# Patient Record
Sex: Male | Born: 1950 | Race: White | Hispanic: No | Marital: Married | Smoking: Former smoker
Health system: Southern US, Community
[De-identification: ages and names within clinical notes are randomized; demographics above are authoritative.]

## PROBLEM LIST (undated history)

## (undated) DIAGNOSIS — D649 Anemia, unspecified: Secondary | ICD-10-CM

## (undated) DIAGNOSIS — Z23 Encounter for immunization: Secondary | ICD-10-CM

## (undated) DIAGNOSIS — G4730 Sleep apnea, unspecified: Secondary | ICD-10-CM

## (undated) DIAGNOSIS — F329 Major depressive disorder, single episode, unspecified: Secondary | ICD-10-CM

## (undated) DIAGNOSIS — C8800 Waldenstrom macroglobulinemia not having achieved remission: Secondary | ICD-10-CM

## (undated) DIAGNOSIS — K635 Polyp of colon: Secondary | ICD-10-CM

## (undated) DIAGNOSIS — K219 Gastro-esophageal reflux disease without esophagitis: Secondary | ICD-10-CM

## (undated) DIAGNOSIS — K529 Noninfective gastroenteritis and colitis, unspecified: Secondary | ICD-10-CM

## (undated) DIAGNOSIS — F32A Depression, unspecified: Secondary | ICD-10-CM

## (undated) DIAGNOSIS — F419 Anxiety disorder, unspecified: Secondary | ICD-10-CM

## (undated) DIAGNOSIS — G629 Polyneuropathy, unspecified: Secondary | ICD-10-CM

## (undated) DIAGNOSIS — R1310 Dysphagia, unspecified: Secondary | ICD-10-CM

## (undated) DIAGNOSIS — C419 Malignant neoplasm of bone and articular cartilage, unspecified: Secondary | ICD-10-CM

## (undated) HISTORY — DX: Noninfective gastroenteritis and colitis, unspecified: K52.9

## (undated) HISTORY — DX: Waldenstrom macroglobulinemia: C88.0

## (undated) HISTORY — DX: Waldenstrom macroglobulinemia not having achieved remission: C88.00

## (undated) HISTORY — DX: Malignant neoplasm of bone and articular cartilage, unspecified: C41.9

## (undated) HISTORY — DX: Gastro-esophageal reflux disease without esophagitis: K21.9

## (undated) HISTORY — DX: Anxiety disorder, unspecified: F41.9

## (undated) HISTORY — PX: JOINT REPLACEMENT: SHX530

## (undated) HISTORY — DX: Encounter for immunization: Z23

## (undated) HISTORY — DX: Depression, unspecified: F32.A

## (undated) HISTORY — DX: Major depressive disorder, single episode, unspecified: F32.9

## (undated) HISTORY — PX: NASAL FRACTURE SURGERY: SHX718

## (undated) HISTORY — PX: CHOLECYSTECTOMY: SHX55

## (undated) HISTORY — PX: FRACTURE SURGERY: SHX138

---

## 2002-04-13 DIAGNOSIS — K529 Noninfective gastroenteritis and colitis, unspecified: Secondary | ICD-10-CM

## 2002-04-13 HISTORY — DX: Noninfective gastroenteritis and colitis, unspecified: K52.9

## 2002-07-02 ENCOUNTER — Emergency Department (HOSPITAL_COMMUNITY): Admission: EM | Admit: 2002-07-02 | Discharge: 2002-07-03 | Payer: Self-pay | Admitting: Emergency Medicine

## 2002-07-03 ENCOUNTER — Inpatient Hospital Stay (HOSPITAL_COMMUNITY): Admission: EM | Admit: 2002-07-03 | Discharge: 2002-07-08 | Payer: Self-pay | Admitting: Emergency Medicine

## 2002-07-03 ENCOUNTER — Encounter: Payer: Self-pay | Admitting: Surgery

## 2002-07-07 ENCOUNTER — Encounter (INDEPENDENT_AMBULATORY_CARE_PROVIDER_SITE_OTHER): Payer: Self-pay | Admitting: Specialist

## 2002-07-08 ENCOUNTER — Encounter: Payer: Self-pay | Admitting: Gastroenterology

## 2006-10-15 ENCOUNTER — Emergency Department: Payer: Self-pay | Admitting: Emergency Medicine

## 2009-03-12 ENCOUNTER — Encounter: Admission: RE | Admit: 2009-03-12 | Discharge: 2009-03-12 | Payer: Self-pay | Admitting: Endocrinology

## 2010-02-06 ENCOUNTER — Ambulatory Visit: Payer: Self-pay | Admitting: Oncology

## 2010-02-10 LAB — CBC WITH DIFFERENTIAL/PLATELET
BASO%: 0.2 % (ref 0.0–2.0)
EOS%: 0.5 % (ref 0.0–7.0)
LYMPH%: 22.3 % (ref 14.0–49.0)
MCHC: 33.1 g/dL (ref 32.0–36.0)
MCV: 90.2 fL (ref 79.3–98.0)
MONO%: 5.4 % (ref 0.0–14.0)
Platelets: 203 10*3/uL (ref 140–400)
RBC: 3.99 10*6/uL — ABNORMAL LOW (ref 4.20–5.82)
RDW: 13.9 % (ref 11.0–14.6)

## 2010-02-11 ENCOUNTER — Other Ambulatory Visit: Admission: RE | Admit: 2010-02-11 | Discharge: 2010-02-11 | Payer: Self-pay | Admitting: Oncology

## 2010-02-12 ENCOUNTER — Ambulatory Visit (HOSPITAL_COMMUNITY): Admission: RE | Admit: 2010-02-12 | Discharge: 2010-02-12 | Payer: Self-pay | Admitting: Oncology

## 2010-02-12 LAB — COMPREHENSIVE METABOLIC PANEL
ALT: <8 U/L (ref 0–53)
AST: 13 U/L (ref 0–37)
Alkaline Phosphatase: 57 U/L (ref 39–117)
Glucose, Bld: 99 mg/dL (ref 70–99)
Potassium: 4.3 mEq/L (ref 3.5–5.3)
Sodium: 140 mEq/L (ref 135–145)
Total Bilirubin: 0.8 mg/dL (ref 0.3–1.2)
Total Protein: 8.8 g/dL — ABNORMAL HIGH (ref 6.0–8.3)

## 2010-02-12 LAB — KAPPA/LAMBDA LIGHT CHAINS
Kappa:Lambda Ratio: 0.22 — ABNORMAL LOW (ref 0.26–1.65)
Lambda Free Lght Chn: 2.89 mg/dL — ABNORMAL HIGH (ref 0.57–2.63)

## 2010-02-12 LAB — SPEP & IFE WITH QIG
Alpha-1-Globulin: 3.5 % (ref 2.9–4.9)
Beta 2: 37.4 % — ABNORMAL HIGH (ref 3.2–6.5)
Gamma Globulin: 4.3 % — ABNORMAL LOW (ref 11.1–18.8)
IgG (Immunoglobin G), Serum: 639 mg/dL — ABNORMAL LOW (ref 694–1618)
IgM, Serum: 4400 mg/dL — ABNORMAL HIGH (ref 60–263)
M-Spike, %: 3.02 g/dL

## 2010-02-12 LAB — FERRITIN: Ferritin: 60 ng/mL (ref 22–322)

## 2010-02-12 LAB — URIC ACID: Uric Acid, Serum: 5.8 mg/dL (ref 4.0–7.8)

## 2010-02-12 LAB — FOLATE: Folate: 8.4 ng/mL

## 2010-02-12 LAB — IRON AND TIBC: %SAT: 33 % (ref 20–55)

## 2010-02-18 LAB — UIFE/LIGHT CHAINS/TP QN, 24-HR UR
Albumin, U: DETECTED
Alpha 2, Urine: DETECTED — AB
Beta, Urine: DETECTED — AB
Free Kappa/Lambda Ratio: 0.6 ratio (ref 0.46–4.00)
Total Protein, Urine-Ur/day: 84 mg/d (ref 10–140)
Total Protein, Urine: 8.8 mg/dL

## 2010-03-03 ENCOUNTER — Ambulatory Visit (HOSPITAL_COMMUNITY)
Admission: RE | Admit: 2010-03-03 | Discharge: 2010-03-03 | Payer: Self-pay | Source: Home / Self Care | Admitting: Oncology

## 2010-03-03 LAB — CBC WITH DIFFERENTIAL/PLATELET
BASO%: 0.6 % (ref 0.0–2.0)
EOS%: 2 % (ref 0.0–7.0)
HCT: 34.6 % — ABNORMAL LOW (ref 38.4–49.9)
MCH: 31.1 pg (ref 27.2–33.4)
MCHC: 34.3 g/dL (ref 32.0–36.0)
NEUT%: 51.5 % (ref 39.0–75.0)
lymph#: 1.8 10*3/uL (ref 0.9–3.3)

## 2010-04-02 ENCOUNTER — Ambulatory Visit: Payer: Self-pay | Admitting: Oncology

## 2010-04-03 LAB — CBC WITH DIFFERENTIAL/PLATELET
BASO%: 0.4 % (ref 0.0–2.0)
EOS%: 1.2 % (ref 0.0–7.0)
Eosinophils Absolute: 0.1 10*3/uL (ref 0.0–0.5)
MCV: 90.9 fL (ref 79.3–98.0)
MONO%: 7.4 % (ref 0.0–14.0)
NEUT#: 3.2 10*3/uL (ref 1.5–6.5)
RBC: 3.75 10*6/uL — ABNORMAL LOW (ref 4.20–5.82)
RDW: 14.4 % (ref 11.0–14.6)

## 2010-04-08 LAB — PROTEIN ELECTROPHORESIS, SERUM
Alpha-1-Globulin: 3.2 % (ref 2.9–4.9)
Alpha-2-Globulin: 7.9 % (ref 7.1–11.8)
Beta 2: 40.5 % — ABNORMAL HIGH (ref 3.2–6.5)
Beta Globulin: 4.5 % — ABNORMAL LOW (ref 4.7–7.2)
Gamma Globulin: 4.6 % — ABNORMAL LOW (ref 11.1–18.8)

## 2010-04-08 LAB — COMPREHENSIVE METABOLIC PANEL
ALT: 12 U/L (ref 0–53)
AST: 19 U/L (ref 0–37)
Albumin: 3.6 g/dL (ref 3.5–5.2)
Alkaline Phosphatase: 61 U/L (ref 39–117)
Glucose, Bld: 89 mg/dL (ref 70–99)
Potassium: 4.8 mEq/L (ref 3.5–5.3)
Sodium: 139 mEq/L (ref 135–145)
Total Protein: 9.3 g/dL — ABNORMAL HIGH (ref 6.0–8.3)

## 2010-04-28 LAB — CBC WITH DIFFERENTIAL/PLATELET
BASO%: 0.4 % (ref 0.0–2.0)
Basophils Absolute: 0 10*3/uL (ref 0.0–0.1)
EOS%: 1.3 % (ref 0.0–7.0)
Eosinophils Absolute: 0.1 10*3/uL (ref 0.0–0.5)
HCT: 36.4 % — ABNORMAL LOW (ref 38.4–49.9)
HGB: 12.5 g/dL — ABNORMAL LOW (ref 13.0–17.1)
LYMPH%: 29.7 % (ref 14.0–49.0)
MCH: 31.2 pg (ref 27.2–33.4)
MCHC: 34.3 g/dL (ref 32.0–36.0)
MCV: 90.8 fL (ref 79.3–98.0)
MONO#: 0.4 10*3/uL (ref 0.1–0.9)
MONO%: 7.8 % (ref 0.0–14.0)
NEUT#: 3.2 10*3/uL (ref 1.5–6.5)
NEUT%: 60.8 % (ref 39.0–75.0)
Platelets: 245 10*3/uL (ref 140–400)
RBC: 4.01 10*6/uL — ABNORMAL LOW (ref 4.20–5.82)
RDW: 14 % (ref 11.0–14.6)
WBC: 5.3 10*3/uL (ref 4.0–10.3)
lymph#: 1.6 10*3/uL (ref 0.9–3.3)

## 2010-04-30 LAB — PROTEIN ELECTROPHORESIS, SERUM
Albumin ELP: 39.2 % — ABNORMAL LOW (ref 55.8–66.1)
Alpha-1-Globulin: 3.6 % (ref 2.9–4.9)
Alpha-2-Globulin: 8.5 % (ref 7.1–11.8)
Beta 2: 40 % — ABNORMAL HIGH (ref 3.2–6.5)
Beta Globulin: 4.3 % — ABNORMAL LOW (ref 4.7–7.2)
Gamma Globulin: 4.4 % — ABNORMAL LOW (ref 11.1–18.8)
M-Spike, %: 3.59 g/dL
Total Protein, Serum Electrophoresis: 9.8 g/dL — ABNORMAL HIGH (ref 6.0–8.3)

## 2010-04-30 LAB — COMPREHENSIVE METABOLIC PANEL
ALT: 9 U/L (ref 0–53)
AST: 15 U/L (ref 0–37)
Albumin: 3.8 g/dL (ref 3.5–5.2)
Alkaline Phosphatase: 61 U/L (ref 39–117)
BUN: 29 mg/dL — ABNORMAL HIGH (ref 6–23)
CO2: 28 mEq/L (ref 19–32)
Calcium: 9.2 mg/dL (ref 8.4–10.5)
Chloride: 98 mEq/L (ref 96–112)
Creatinine, Ser: 0.95 mg/dL (ref 0.40–1.50)
Glucose, Bld: 95 mg/dL (ref 70–99)
Potassium: 4.9 mEq/L (ref 3.5–5.3)
Sodium: 136 mEq/L (ref 135–145)
Total Bilirubin: 0.6 mg/dL (ref 0.3–1.2)
Total Protein: 9.8 g/dL — ABNORMAL HIGH (ref 6.0–8.3)

## 2010-04-30 LAB — IGG, IGA, IGM
IgA: 85 mg/dL (ref 68–378)
IgG (Immunoglobin G), Serum: 666 mg/dL — ABNORMAL LOW (ref 694–1618)
IgM, Serum: 5180 mg/dL — ABNORMAL HIGH (ref 60–263)

## 2010-04-30 LAB — LACTATE DEHYDROGENASE: LDH: 81 U/L — ABNORMAL LOW (ref 94–250)

## 2010-04-30 LAB — URIC ACID: Uric Acid, Serum: 5.3 mg/dL (ref 4.0–7.8)

## 2010-05-02 ENCOUNTER — Ambulatory Visit: Payer: Self-pay | Admitting: Oncology

## 2010-05-14 ENCOUNTER — Other Ambulatory Visit: Payer: Self-pay | Admitting: Oncology

## 2010-05-14 DIAGNOSIS — C9000 Multiple myeloma not having achieved remission: Secondary | ICD-10-CM

## 2010-05-14 DIAGNOSIS — C880 Waldenstrom macroglobulinemia: Secondary | ICD-10-CM

## 2010-05-16 ENCOUNTER — Other Ambulatory Visit: Payer: Self-pay | Admitting: Oncology

## 2010-05-16 ENCOUNTER — Ambulatory Visit (HOSPITAL_COMMUNITY): Payer: 59

## 2010-05-16 ENCOUNTER — Other Ambulatory Visit: Payer: Self-pay | Admitting: Interventional Radiology

## 2010-05-16 ENCOUNTER — Ambulatory Visit (HOSPITAL_COMMUNITY)
Admission: RE | Admit: 2010-05-16 | Discharge: 2010-05-16 | Disposition: A | Discharge status: Home / Self Care | Payer: 59 | Source: Ambulatory Visit | Attending: Oncology | Admitting: Oncology

## 2010-05-16 ENCOUNTER — Encounter (HOSPITAL_COMMUNITY): Payer: Self-pay

## 2010-05-16 DIAGNOSIS — C880 Waldenstrom macroglobulinemia: Secondary | ICD-10-CM

## 2010-05-16 DIAGNOSIS — Z85830 Personal history of malignant neoplasm of bone: Secondary | ICD-10-CM | POA: Insufficient documentation

## 2010-05-16 DIAGNOSIS — D649 Anemia, unspecified: Secondary | ICD-10-CM | POA: Insufficient documentation

## 2010-05-16 DIAGNOSIS — C9000 Multiple myeloma not having achieved remission: Secondary | ICD-10-CM

## 2010-05-16 DIAGNOSIS — K219 Gastro-esophageal reflux disease without esophagitis: Secondary | ICD-10-CM | POA: Insufficient documentation

## 2010-05-16 HISTORY — DX: Anemia, unspecified: D64.9

## 2010-05-16 LAB — PROTIME-INR: INR: 1.19 (ref 0.00–1.49)

## 2010-05-16 LAB — CBC
HCT: 34.3 % — ABNORMAL LOW (ref 39.0–52.0)
Platelets: 212 10*3/uL (ref 150–400)
RBC: 3.8 MIL/uL — ABNORMAL LOW (ref 4.22–5.81)
RDW: 13.6 % (ref 11.5–15.5)
WBC: 5.1 10*3/uL (ref 4.0–10.5)

## 2010-05-16 LAB — APTT: aPTT: 33 seconds (ref 24–37)

## 2010-05-22 ENCOUNTER — Other Ambulatory Visit: Payer: Self-pay | Admitting: Oncology

## 2010-05-22 ENCOUNTER — Encounter (HOSPITAL_BASED_OUTPATIENT_CLINIC_OR_DEPARTMENT_OTHER): Payer: 59 | Admitting: Oncology

## 2010-05-22 DIAGNOSIS — R5381 Other malaise: Secondary | ICD-10-CM

## 2010-05-22 DIAGNOSIS — C9000 Multiple myeloma not having achieved remission: Secondary | ICD-10-CM

## 2010-05-22 DIAGNOSIS — D649 Anemia, unspecified: Secondary | ICD-10-CM

## 2010-05-22 DIAGNOSIS — R161 Splenomegaly, not elsewhere classified: Secondary | ICD-10-CM

## 2010-05-27 ENCOUNTER — Other Ambulatory Visit: Payer: Self-pay | Admitting: Oncology

## 2010-05-27 ENCOUNTER — Encounter (HOSPITAL_BASED_OUTPATIENT_CLINIC_OR_DEPARTMENT_OTHER): Payer: 59 | Admitting: Oncology

## 2010-05-27 DIAGNOSIS — C880 Waldenstrom macroglobulinemia: Secondary | ICD-10-CM

## 2010-05-27 DIAGNOSIS — R5381 Other malaise: Secondary | ICD-10-CM

## 2010-05-27 DIAGNOSIS — C9000 Multiple myeloma not having achieved remission: Secondary | ICD-10-CM

## 2010-05-27 DIAGNOSIS — R161 Splenomegaly, not elsewhere classified: Secondary | ICD-10-CM

## 2010-05-30 ENCOUNTER — Ambulatory Visit (HOSPITAL_COMMUNITY)
Admission: RE | Admit: 2010-05-30 | Discharge: 2010-05-30 | Disposition: A | Discharge status: Home / Self Care | Payer: 59 | Source: Ambulatory Visit | Attending: Oncology | Admitting: Oncology

## 2010-05-30 ENCOUNTER — Other Ambulatory Visit: Payer: Self-pay | Admitting: Oncology

## 2010-05-30 DIAGNOSIS — M9190 Juvenile osteochondrosis of hip and pelvis, unspecified, unspecified leg: Secondary | ICD-10-CM | POA: Insufficient documentation

## 2010-05-30 DIAGNOSIS — R161 Splenomegaly, not elsewhere classified: Secondary | ICD-10-CM

## 2010-06-02 ENCOUNTER — Other Ambulatory Visit: Payer: Self-pay | Admitting: Oncology

## 2010-06-02 ENCOUNTER — Encounter (HOSPITAL_BASED_OUTPATIENT_CLINIC_OR_DEPARTMENT_OTHER): Payer: 59 | Admitting: Oncology

## 2010-06-02 DIAGNOSIS — C880 Waldenstrom macroglobulinemia: Secondary | ICD-10-CM

## 2010-06-02 DIAGNOSIS — R5383 Other fatigue: Secondary | ICD-10-CM

## 2010-06-02 DIAGNOSIS — R161 Splenomegaly, not elsewhere classified: Secondary | ICD-10-CM

## 2010-06-02 DIAGNOSIS — D649 Anemia, unspecified: Secondary | ICD-10-CM

## 2010-06-02 DIAGNOSIS — Z5112 Encounter for antineoplastic immunotherapy: Secondary | ICD-10-CM

## 2010-06-02 LAB — CBC WITH DIFFERENTIAL/PLATELET
Basophils Absolute: 0 10*3/uL (ref 0.0–0.1)
Eosinophils Absolute: 0 10*3/uL (ref 0.0–0.5)
HGB: 11.7 g/dL — ABNORMAL LOW (ref 13.0–17.1)
LYMPH%: 31.7 % (ref 14.0–49.0)
MCV: 90.4 fL (ref 79.3–98.0)
MONO%: 6 % (ref 0.0–14.0)
NEUT#: 3.1 10*3/uL (ref 1.5–6.5)
NEUT%: 61.3 % (ref 39.0–75.0)
Platelets: 219 10*3/uL (ref 140–400)

## 2010-06-02 LAB — COMPREHENSIVE METABOLIC PANEL
CO2: 25 mEq/L (ref 19–32)
Creatinine, Ser: 0.86 mg/dL (ref 0.40–1.50)
Glucose, Bld: 91 mg/dL (ref 70–99)
Total Bilirubin: 0.6 mg/dL (ref 0.3–1.2)
Total Protein: 8.8 g/dL — ABNORMAL HIGH (ref 6.0–8.3)

## 2010-06-02 LAB — URIC ACID: Uric Acid, Serum: 5.7 mg/dL (ref 4.0–7.8)

## 2010-06-02 LAB — LACTATE DEHYDROGENASE: LDH: 114 U/L (ref 94–250)

## 2010-06-09 ENCOUNTER — Other Ambulatory Visit: Payer: Self-pay | Admitting: Oncology

## 2010-06-09 ENCOUNTER — Encounter (HOSPITAL_BASED_OUTPATIENT_CLINIC_OR_DEPARTMENT_OTHER): Payer: 59 | Admitting: Oncology

## 2010-06-09 DIAGNOSIS — D649 Anemia, unspecified: Secondary | ICD-10-CM

## 2010-06-09 DIAGNOSIS — Z5112 Encounter for antineoplastic immunotherapy: Secondary | ICD-10-CM

## 2010-06-09 DIAGNOSIS — R5381 Other malaise: Secondary | ICD-10-CM

## 2010-06-09 DIAGNOSIS — R161 Splenomegaly, not elsewhere classified: Secondary | ICD-10-CM

## 2010-06-09 DIAGNOSIS — C880 Waldenstrom macroglobulinemia: Secondary | ICD-10-CM

## 2010-06-09 LAB — COMPREHENSIVE METABOLIC PANEL
ALT: 10 U/L (ref 0–53)
Alkaline Phosphatase: 50 U/L (ref 39–117)
Sodium: 139 mEq/L (ref 135–145)
Total Bilirubin: 0.5 mg/dL (ref 0.3–1.2)
Total Protein: 9.3 g/dL — ABNORMAL HIGH (ref 6.0–8.3)

## 2010-06-09 LAB — CBC WITH DIFFERENTIAL/PLATELET
BASO%: 0.5 % (ref 0.0–2.0)
EOS%: 1.7 % (ref 0.0–7.0)
LYMPH%: 31.7 % (ref 14.0–49.0)
MCH: 30 pg (ref 27.2–33.4)
MCHC: 33.1 g/dL (ref 32.0–36.0)
MCV: 90.6 fL (ref 79.3–98.0)
MONO#: 0.2 10*3/uL (ref 0.1–0.9)
MONO%: 5.7 % (ref 0.0–14.0)
Platelets: 264 10*3/uL (ref 140–400)
RBC: 4.04 10*6/uL — ABNORMAL LOW (ref 4.20–5.82)
WBC: 4.2 10*3/uL (ref 4.0–10.3)

## 2010-06-16 ENCOUNTER — Encounter (HOSPITAL_BASED_OUTPATIENT_CLINIC_OR_DEPARTMENT_OTHER): Payer: 59 | Admitting: Oncology

## 2010-06-16 ENCOUNTER — Other Ambulatory Visit: Payer: Self-pay | Admitting: Oncology

## 2010-06-16 DIAGNOSIS — R161 Splenomegaly, not elsewhere classified: Secondary | ICD-10-CM

## 2010-06-16 DIAGNOSIS — C880 Waldenstrom macroglobulinemia: Secondary | ICD-10-CM

## 2010-06-16 DIAGNOSIS — Z5112 Encounter for antineoplastic immunotherapy: Secondary | ICD-10-CM

## 2010-06-16 DIAGNOSIS — R630 Anorexia: Secondary | ICD-10-CM

## 2010-06-16 DIAGNOSIS — R5381 Other malaise: Secondary | ICD-10-CM

## 2010-06-16 DIAGNOSIS — D649 Anemia, unspecified: Secondary | ICD-10-CM

## 2010-06-16 LAB — CBC WITH DIFFERENTIAL/PLATELET
BASO%: 0.1 % (ref 0.0–2.0)
Eosinophils Absolute: 0 10*3/uL (ref 0.0–0.5)
HCT: 33.5 % — ABNORMAL LOW (ref 38.4–49.9)
LYMPH%: 8 % — ABNORMAL LOW (ref 14.0–49.0)
MCHC: 34.3 g/dL (ref 32.0–36.0)
MCV: 90.3 fL (ref 79.3–98.0)
MONO%: 4.2 % (ref 0.0–14.0)
NEUT%: 87.6 % — ABNORMAL HIGH (ref 39.0–75.0)
Platelets: 300 10*3/uL (ref 140–400)
RBC: 3.72 10*6/uL — ABNORMAL LOW (ref 4.20–5.82)

## 2010-06-23 ENCOUNTER — Other Ambulatory Visit: Payer: Self-pay | Admitting: Oncology

## 2010-06-23 ENCOUNTER — Encounter (HOSPITAL_BASED_OUTPATIENT_CLINIC_OR_DEPARTMENT_OTHER): Payer: 59 | Admitting: Oncology

## 2010-06-23 DIAGNOSIS — R161 Splenomegaly, not elsewhere classified: Secondary | ICD-10-CM

## 2010-06-23 DIAGNOSIS — D649 Anemia, unspecified: Secondary | ICD-10-CM

## 2010-06-23 DIAGNOSIS — M25559 Pain in unspecified hip: Secondary | ICD-10-CM

## 2010-06-23 DIAGNOSIS — R5381 Other malaise: Secondary | ICD-10-CM

## 2010-06-23 DIAGNOSIS — C8800 Waldenstrom macroglobulinemia not having achieved remission: Secondary | ICD-10-CM

## 2010-06-23 DIAGNOSIS — Z5112 Encounter for antineoplastic immunotherapy: Secondary | ICD-10-CM

## 2010-06-23 DIAGNOSIS — D6481 Anemia due to antineoplastic chemotherapy: Secondary | ICD-10-CM

## 2010-06-23 DIAGNOSIS — T451X5A Adverse effect of antineoplastic and immunosuppressive drugs, initial encounter: Secondary | ICD-10-CM

## 2010-06-23 LAB — CBC WITH DIFFERENTIAL/PLATELET
BASO%: 0.2 % (ref 0.0–2.0)
EOS%: 1 % (ref 0.0–7.0)
LYMPH%: 28 % (ref 14.0–49.0)
MCH: 29.9 pg (ref 27.2–33.4)
MCHC: 32.9 g/dL (ref 32.0–36.0)
MONO#: 0.4 10*3/uL (ref 0.1–0.9)
Platelets: 304 10*3/uL (ref 140–400)
RBC: 3.74 10*6/uL — ABNORMAL LOW (ref 4.20–5.82)
WBC: 4.9 10*3/uL (ref 4.0–10.3)

## 2010-06-23 LAB — COMPREHENSIVE METABOLIC PANEL
ALT: 16 U/L (ref 0–53)
AST: 14 U/L (ref 0–37)
Alkaline Phosphatase: 62 U/L (ref 39–117)
CO2: 25 mEq/L (ref 19–32)
Creatinine, Ser: 0.76 mg/dL (ref 0.40–1.50)
Sodium: 136 mEq/L (ref 135–145)
Total Bilirubin: 0.4 mg/dL (ref 0.3–1.2)
Total Protein: 8.2 g/dL (ref 6.0–8.3)

## 2010-06-24 ENCOUNTER — Other Ambulatory Visit: Payer: Self-pay | Admitting: Oncology

## 2010-06-24 DIAGNOSIS — C8800 Waldenstrom macroglobulinemia not having achieved remission: Secondary | ICD-10-CM

## 2010-06-24 DIAGNOSIS — R161 Splenomegaly, not elsewhere classified: Secondary | ICD-10-CM

## 2010-06-24 LAB — CHROMOSOME ANALYSIS, BONE MARROW

## 2010-06-24 LAB — BONE MARROW EXAM

## 2010-06-24 LAB — TISSUE HYBRIDIZATION (BONE MARROW)-NCBH

## 2010-07-04 ENCOUNTER — Other Ambulatory Visit (HOSPITAL_COMMUNITY): Payer: 59

## 2010-08-12 ENCOUNTER — Other Ambulatory Visit: Payer: Self-pay | Admitting: Oncology

## 2010-08-12 ENCOUNTER — Encounter (HOSPITAL_BASED_OUTPATIENT_CLINIC_OR_DEPARTMENT_OTHER): Payer: 59 | Admitting: Oncology

## 2010-08-12 DIAGNOSIS — D649 Anemia, unspecified: Secondary | ICD-10-CM

## 2010-08-12 LAB — CBC WITH DIFFERENTIAL/PLATELET
EOS%: 0.8 % (ref 0.0–7.0)
MCH: 30.6 pg (ref 27.2–33.4)
MCV: 90 fL (ref 79.3–98.0)
MONO%: 6.5 % (ref 0.0–14.0)
NEUT#: 3.5 10*3/uL (ref 1.5–6.5)
RBC: 3.74 10*6/uL — ABNORMAL LOW (ref 4.20–5.82)
RDW: 13.4 % (ref 11.0–14.6)
lymph#: 1 10*3/uL (ref 0.9–3.3)

## 2010-08-12 LAB — CHCC SMEAR

## 2010-08-13 ENCOUNTER — Other Ambulatory Visit: Payer: Self-pay | Admitting: Interventional Radiology

## 2010-08-13 ENCOUNTER — Ambulatory Visit (HOSPITAL_COMMUNITY): Payer: 59

## 2010-08-13 ENCOUNTER — Ambulatory Visit (HOSPITAL_COMMUNITY)
Admission: RE | Admit: 2010-08-13 | Discharge: 2010-08-13 | Disposition: A | Discharge status: Home / Self Care | Payer: 59 | Source: Ambulatory Visit | Attending: Oncology | Admitting: Oncology

## 2010-08-13 ENCOUNTER — Other Ambulatory Visit: Payer: Self-pay | Admitting: Oncology

## 2010-08-13 DIAGNOSIS — C8800 Waldenstrom macroglobulinemia not having achieved remission: Secondary | ICD-10-CM | POA: Insufficient documentation

## 2010-08-13 DIAGNOSIS — Z79899 Other long term (current) drug therapy: Secondary | ICD-10-CM | POA: Insufficient documentation

## 2010-08-13 DIAGNOSIS — D649 Anemia, unspecified: Secondary | ICD-10-CM | POA: Insufficient documentation

## 2010-08-13 DIAGNOSIS — R161 Splenomegaly, not elsewhere classified: Secondary | ICD-10-CM | POA: Insufficient documentation

## 2010-08-13 LAB — PROTIME-INR
INR: 1.22 (ref 0.00–1.49)
Prothrombin Time: 15.6 seconds — ABNORMAL HIGH (ref 11.6–15.2)

## 2010-08-13 LAB — CBC
Platelets: 247 10*3/uL (ref 150–400)
RDW: 13 % (ref 11.5–15.5)
WBC: 4.7 10*3/uL (ref 4.0–10.5)

## 2010-08-13 LAB — APTT: aPTT: 34 seconds (ref 24–37)

## 2010-08-14 LAB — COMPREHENSIVE METABOLIC PANEL
ALT: 8 U/L (ref 0–53)
AST: 17 U/L (ref 0–37)
Alkaline Phosphatase: 74 U/L (ref 39–117)
Creatinine, Ser: 0.86 mg/dL (ref 0.40–1.50)
Total Bilirubin: 0.6 mg/dL (ref 0.3–1.2)

## 2010-08-14 LAB — IGG, IGA, IGM
IgA: 79 mg/dL (ref 68–378)
IgM, Serum: 3530 mg/dL — ABNORMAL HIGH (ref 60–263)

## 2010-08-14 LAB — PROTEIN ELECTROPHORESIS, SERUM
Alpha-2-Globulin: 10.4 % (ref 7.1–11.8)
Beta 2: 33.4 % — ABNORMAL HIGH (ref 3.2–6.5)
Beta Globulin: 4.9 % (ref 4.7–7.2)
Gamma Globulin: 4.1 % — ABNORMAL LOW (ref 11.1–18.8)
M-Spike, %: 2.46 g/dL
Total Protein, Serum Electrophoresis: 8.4 g/dL — ABNORMAL HIGH (ref 6.0–8.3)

## 2010-08-14 LAB — KAPPA/LAMBDA LIGHT CHAINS: Lambda Free Lght Chn: 2.1 mg/dL (ref 0.57–2.63)

## 2010-08-21 ENCOUNTER — Encounter (HOSPITAL_BASED_OUTPATIENT_CLINIC_OR_DEPARTMENT_OTHER): Payer: 59 | Admitting: Oncology

## 2010-08-21 DIAGNOSIS — R630 Anorexia: Secondary | ICD-10-CM

## 2010-08-21 DIAGNOSIS — C880 Waldenstrom macroglobulinemia: Secondary | ICD-10-CM

## 2010-08-21 DIAGNOSIS — D649 Anemia, unspecified: Secondary | ICD-10-CM

## 2010-08-29 NOTE — Consult Note (Signed)
NAME:  [name], [name] NO.:  [id number]   MEDICAL RECORD NO.:  [id number]                   PATIENT TYPE:  EMS   LOCATION:  ED                                   FACILITY:  [facility]   PHYSICIAN:  Velora Heckler, M.D.                DATE OF BIRTH:  October 15, 1950   DATE OF CONSULTATION:  07/02/2002  DATE OF DISCHARGE:  07/03/2002                                   CONSULTATION   REASON FOR CONSULTATION:  Abdominal pain.   REFERRING PHYSICIAN:  Lorre Nick, M.D. in the emergency department at  [facility].   HISTORY OF PRESENT ILLNESS:  The patient is a 60 year old white male from  [place], [place], who presents with a 24 hour history of right lower quadrant  abdominal pain.  The patient experienced onset of pain on the evening of  07/01/02.  It persisted through the night and became more severe during the  day of 07/02/02.  The pain was always in the right lower quadrant and  suprapubic region.  It did not radiate.  It did not seem to change in  character.  The patient denies fevers or chills.  He denies nausea or  vomiting.  He had noted diarrheal stools for approximately one week.  The  patient did have cold sweats earlier in the day.  No other family members  are ill.  The patient notes pain with movement.  He presented to the  emergency department for evaluation.   PAST MEDICAL HISTORY:  History of nasal surgery.   MEDICATIONS:  None.   ALLERGIES:  No known drug allergies.   SOCIAL HISTORY:  The patient is accompanied by his wife.  He works as a  Retail buyer for [employer].  He smokes a pack of cigarettes a  day.  He denies alcohol use, he denies drug use.   REVIEW OF SYMPTOMS:  A 15 system review without significant other positives  except as noted above.   FAMILY HISTORY:  No untoward events with either surgery or anesthesia in any  family member.   PHYSICAL EXAMINATION:  GENERAL:  A 60 year old thin white male on  a  stretcher in the emergency department.  VITAL SIGNS:  Temperature 97, pulse 90, respirations 24, blood pressure  131/83, O2 saturation 100% on room air.  HEENT:  Normocephalic.  Sclerae are clear.  Dentition are fair.  Voice is  normal.  NECK:  Anterior examination of the neck shows it to be symmetric, thyroid is  normal without nodularity.  There is no anterior or posterior cervical  adenopathy.  LUNGS:  Clear to auscultation.  CARDIAC:  Regular rate and rhythm without murmur.  Peripheral pulses are  full.  ABDOMEN:  Scaphoid.  There is voluntary guarding.  There are a few scattered  bowel sounds on auscultation.  There are no obvious surgical wounds.  There  is no  sign of hernia.  There is tenderness to percussion and palpation in  the suprapubic and right lower quadrant regions.  There is mild rebound  tenderness.  EXTREMITIES:  Nontender without edema.  NEUROLOGIC:  The patient is alert and oriented without focal neurologic  deficits.   LABORATORY DATA:  Laboratory studies drawn in the emergency department  include a white blood cell count of 5.8, hemoglobin 13.3, hematocrit 38.5%,  platelet count 246,000.  Differential shows 55% neutrophils, 30%  lymphocytes, 12% monocytes, and 3% eosinophils.  Urinalysis is benign.  Chemistry profile is completely within normal limits, including  electrolytes, renal function, and liver function tests.   IMPRESSION:  Right lower quadrant and suprapubic abdominal pain of  undetermined etiology, one week history of diarrheal stools.   PLAN:  CT scan of abdomen and pelvis will be obtained in the emergency  department to rule out acute intra-abdominal process such as appendicitis,  intussusception, or small bowel obstruction.  If the scan is unrevealing,  may consider a period of observation in the hospital.  We will await results  of CT scan of abdomen and pelvis to be performed urgently this evening.                                                 Velora Heckler, M.D.    TMG/MEDQ  D:  07/03/2002  T:  07/03/2002  Job:  [id number]   cc:   Lorre Nick, M.D.  [address] [place], [place] [address]  Fax: [phone]

## 2010-08-29 NOTE — Discharge Summary (Signed)
NAME:  [name] NO.:  [id number]   MEDICAL RECORD NO.:  [id number]                   PATIENT TYPE:  INP   LOCATION:  0478                                 FACILITY:  [facility]   PHYSICIAN:  Sharlet Salina T. Hoxworth, M.D.          DATE OF BIRTH:  November 15, 1950   DATE OF ADMISSION:  07/03/2002  DATE OF DISCHARGE:  07/08/2002                                 DISCHARGE SUMMARY   DISCHARGE DIAGNOSIS:  Right lower quadrant abdominal pain, apparent  infectious ileitis.   OPERATIONS AND PROCEDURES:  Colonoscopy, Malcolm T. Russella Dar, M.D., 07/07/02.   HISTORY OF PRESENT ILLNESS:  Mr. [name] is a 60 year old white male, who,  one week prior to this admission, had the onset of diarrhea with 1-2 watery,  brown or greenish stools per day.  Two days ago, he had the gradual onset of  occasional brief right lower quadrant abdominal pain.  Over the past 24  hours, he has had more severe constant pain in the right lower quadrant  which still feels to be cramping in nature with exacerbations but fairly  constant.  He has had no nausea or vomiting.  He continues to have diarrhea  as above.  No fever or chills.  He has no history of any previous similar  symptoms or pain.  The patient was evaluated in the emergency room yesterday  and discharged home with a diagnosis of gastroenteritis but re-presented  today with persistent and increasing right lower quadrant pain.   PAST MEDICAL HISTORY:  1. Nasal surgery.  2. No other serious illness or hospitalizations.   MEDICATIONS:  He is on no medications.   ALLERGIES:  None.   HABITS:  He does smoke cigarettes.   PHYSICAL EXAMINATION:  VITAL SIGNS:  Afebrile, pulse 90, respirations 22,  blood pressure 135/84.  GENERAL:  He is a thin, white male, who appears in significant pain,  occasionally writhing on the bed.  ABDOMEN:  Pertinent findings within the abdomen which revealed hyperactive  bowel sounds.  There was tenderness and  guarding in the right lower  quadrant.   LABORATORY DATA:  White count 7.8.  LFTs, electrolytes, urinalysis all  negative.   CT scan from the previous day was re-reviewed which revealed some moderate  amount of pelvic free fluid.  The appendix was seen and appeared normal, and  there appeared to be questionable thickening in the distal ileum.   HOSPITAL COURSE:  The patient was admitted due to persistent and worsening  pain with a working diagnosis of colitis or ileitis.  Based on his clinical  presentation and CT scan, appendicitis was felt to be much less likely.  He  was treated with intravenous Cipro and Flagyl.  He remained afebrile with a  normal white count.  His abdominal tenderness and pain, however, persisted  over the next 48 hours.  Stool studies, routine cultures, O&P, and stool for  blood and white count  were all negative.  I did consult gastroenterology at  this point, and he was seen by Judie Petit T. Russella Dar, M.D.  This was felt to be  probably most likely an infectious colitis or ileitis but with his  persistent symptoms, more serious problems such as Crohn's disease was felt  possible as well.  By 07/06/02, he was feeling somewhat better but still  having pain.  Colonoscopy was recommended for 07/07/02, which was performed  and was essentially normal with specifically no significant inflammatory  change seen.  Biopsies of the ascending colon showed no active inflammation  or granulomas.  A small tubular adenoma was removed from the sigmoid and  proximal rectum.  One was hyperplastic; one was tubular.  In order to more  completely evaluate the distal small bowel, a small-bowel follow-through was  obtained on 07/08/02 which was normal.  At this point, the patient was  feeling significantly better; his diarrhea had improved, and he was  tolerating a regular diet.  He was discharged home at this time.   DISCHARGE MEDICATIONS:  He will continue p.o. Cipro and Flagyl for one  week.   FOLLOW UP:  He is to see Judie Petit T. Russella Dar, M.D. in follow-up in one week.                                               Lorne Skeens. Hoxworth, M.D.    Tory Emerald  D:  07/17/2002  T:  07/17/2002  Job:  [id number]   cc:   Venita Lick. Russella Dar, M.D. [facility]

## 2010-08-29 NOTE — H&P (Signed)
NAME:  [name], [name] NO.:  [id number]   MEDICAL RECORD NO.:  [id number]                   PATIENT TYPE:  INP   LOCATION:  0478                                 FACILITY:  [facility]   PHYSICIAN:  Sharlet Salina T. Hoxworth, M.D.          DATE OF BIRTH:  1951/03/14   DATE OF ADMISSION:  07/03/2002  DATE OF DISCHARGE:                                HISTORY & PHYSICAL   CHIEF COMPLAINT:  Right-sided abdominal pain.   HISTORY OF PRESENT ILLNESS:  The patient is a 60 year old white male  previously in excellent health who approximately one week ago had the onset  of diarrhea.  Since that time he has noted one or two watery stools per day.  These have not had any blood or mucous that he has noted.  He continued to  work and otherwise felt reasonably well until two days ago when he began  having intermittent mild right lower quadrant abdominal pain.  Yesterday the  pain became much more severe and constant.  He describes cramping pain in  his right lower quadrant that is now very severe.  This has persisted  through today, unchanged.  He was seen in the [facility] Emergency Room  yesterday and had a fairly extensive evaluation that was unrevealing.  His  symptoms have persisted, and he re-presented today due to continued severe  pain.  He denies any previous history of GI disease or any similar symptoms.  No fever or chills.  No nausea or vomiting.  No urinary symptoms.   PAST SURGICAL HISTORY:  He has had nasal surgery.   PAST MEDICAL HISTORY:  No serious illness or hospitalizations.   MEDICATIONS:  He does not take any medications regularly.   ALLERGIES:  No known drug allergies.   SOCIAL HISTORY:  He is married.  Works as a Retail buyer at [employer].  He does not drink alcohol.  He does smoke cigarettes.   FAMILY HISTORY:  Noncontributory.   REVIEW OF SYSTEMS:  GENERAL:  No fever, chills, weight loss.  RESPIRATORY:  No cough, shortness of breath,  wheezing.  CARDIAC:  No chest pain,  palpitations, history of heart disease.  GASTROINTESTINAL:  As above.   PHYSICAL EXAMINATION:  VITAL SIGNS:  Temperature 96.9, pulse 90,  respirations 22, blood pressure 135/84.  GENERAL:  Thin white male in obvious pain, restless and writhing on the  stretcher.  SKIN:  Warm and dry.  Without rash or infection.  HEENT:  No palpable thyromegaly.  Sclerae nonicteric.  Nares and oropharynx  clear.  LYMPH NODES:  No cervical, supraclavicular, axillary, or inguinal nodes  palpable.  CARDIAC:  Slight tachycardia.  Regular rhythm.  No murmurs.  Peripheral  pulses intact.  No edema.  ABDOMEN:  Nondistended.  Bowel sounds are hyperactive.  There is moderate to  marked tenderness in the right lower quadrant and also along the right mid  abdomen and right upper quadrant  to a lesser extent with some guarding.  No  apparent rebound tenderness.  No appreciable masses or hepatosplenomegaly.  EXTREMITIES:  No deformity or joint swelling.  NEUROLOGIC:  Alert, fully oriented.  Motor and sensory exams grossly normal.   LABORATORY DATA:  Yesterday's evaluation included a CBC, chemistries, LFTs,  urinalysis, all of which were within normal limits.  Repeat CBC today shows  a white count of 7.8, normal hemoglobin.  Repeat urinalysis pending.   CT scan of the abdomen and pelvis was obtained yesterday and reviewed again  with the radiologist today.  This shows a mild to moderate amount of free  pelvic fluid.  The appendix is well visualized and appears entirely normal.  There may be some mild thickening of a few loops of distal small bowel, but  this is a questionable finding.  No other pathology seen.   ASSESSMENT AND PLAN:  Persistent severe right-sided abdominal pain with  essentially negative workup except for some free fluid seen in the pelvis on  CT scan.  The crampy nature of his pain and diarrhea suggest enteritis or  colitis with spasm.  Due to the patient's  persistent symptoms and severe  pain, he will be admitted for symptom control and further evaluation and  treatment.  Will cover with Cipro and Flagyl and obtain stool for cultures,  O&P.                                               Lorne Skeens. Hoxworth, M.D.    Tory Emerald  D:  07/03/2002  T:  07/04/2002  Job:  [id number]

## 2010-09-05 ENCOUNTER — Other Ambulatory Visit: Payer: Self-pay | Admitting: Oncology

## 2010-09-05 ENCOUNTER — Encounter (HOSPITAL_BASED_OUTPATIENT_CLINIC_OR_DEPARTMENT_OTHER): Payer: 59 | Admitting: Oncology

## 2010-09-05 DIAGNOSIS — C880 Waldenstrom macroglobulinemia: Secondary | ICD-10-CM

## 2010-09-05 DIAGNOSIS — R630 Anorexia: Secondary | ICD-10-CM

## 2010-09-05 DIAGNOSIS — Z5112 Encounter for antineoplastic immunotherapy: Secondary | ICD-10-CM

## 2010-09-05 DIAGNOSIS — D649 Anemia, unspecified: Secondary | ICD-10-CM

## 2010-09-05 LAB — COMPREHENSIVE METABOLIC PANEL
ALT: 8 U/L (ref 0–53)
AST: 15 U/L (ref 0–37)
Albumin: 4 g/dL (ref 3.5–5.2)
Calcium: 9.5 mg/dL (ref 8.4–10.5)
Chloride: 103 mEq/L (ref 96–112)
Potassium: 4.4 mEq/L (ref 3.5–5.3)
Total Protein: 8 g/dL (ref 6.0–8.3)

## 2010-09-05 LAB — CBC WITH DIFFERENTIAL/PLATELET
BASO%: 0.7 % (ref 0.0–2.0)
Basophils Absolute: 0 10*3/uL (ref 0.0–0.1)
EOS%: 3.7 % (ref 0.0–7.0)
HGB: 11.4 g/dL — ABNORMAL LOW (ref 13.0–17.1)
MCH: 30 pg (ref 27.2–33.4)
RDW: 13.6 % (ref 11.0–14.6)
lymph#: 1.4 10*3/uL (ref 0.9–3.3)

## 2010-09-12 DIAGNOSIS — G4730 Sleep apnea, unspecified: Secondary | ICD-10-CM

## 2010-09-12 HISTORY — DX: Sleep apnea, unspecified: G47.30

## 2010-10-23 ENCOUNTER — Other Ambulatory Visit: Payer: Self-pay | Admitting: Oncology

## 2010-10-23 ENCOUNTER — Encounter (HOSPITAL_BASED_OUTPATIENT_CLINIC_OR_DEPARTMENT_OTHER): Payer: 59 | Admitting: Oncology

## 2010-10-23 DIAGNOSIS — C880 Waldenstrom macroglobulinemia: Secondary | ICD-10-CM

## 2010-10-23 DIAGNOSIS — Z5112 Encounter for antineoplastic immunotherapy: Secondary | ICD-10-CM

## 2010-10-23 DIAGNOSIS — D649 Anemia, unspecified: Secondary | ICD-10-CM

## 2010-10-23 DIAGNOSIS — R630 Anorexia: Secondary | ICD-10-CM

## 2010-10-23 LAB — CBC WITH DIFFERENTIAL/PLATELET
BASO%: 0.3 % (ref 0.0–2.0)
EOS%: 1.8 % (ref 0.0–7.0)
MCH: 30.9 pg (ref 27.2–33.4)
MCHC: 34.5 g/dL (ref 32.0–36.0)
RDW: 14.7 % — ABNORMAL HIGH (ref 11.0–14.6)
lymph#: 1.1 10*3/uL (ref 0.9–3.3)

## 2010-10-27 LAB — IGG, IGA, IGM
IgA: 87 mg/dL (ref 68–379)
IgG (Immunoglobin G), Serum: 593 mg/dL — ABNORMAL LOW (ref 650–1600)

## 2010-10-27 LAB — COMPREHENSIVE METABOLIC PANEL
ALT: 11 U/L (ref 0–53)
AST: 15 U/L (ref 0–37)
Albumin: 3.8 g/dL (ref 3.5–5.2)
Alkaline Phosphatase: 67 U/L (ref 39–117)
Calcium: 9.8 mg/dL (ref 8.4–10.5)
Chloride: 103 mEq/L (ref 96–112)
Creatinine, Ser: 0.94 mg/dL (ref 0.50–1.35)
Potassium: 4.6 mEq/L (ref 3.5–5.3)

## 2010-10-27 LAB — PROTEIN ELECTROPHORESIS, SERUM
Alpha-2-Globulin: 9.2 % (ref 7.1–11.8)
Beta 2: 30.6 % — ABNORMAL HIGH (ref 3.2–6.5)
Gamma Globulin: 4.2 % — ABNORMAL LOW (ref 11.1–18.8)
M-Spike, %: 2.25 g/dL

## 2010-10-31 ENCOUNTER — Encounter (HOSPITAL_BASED_OUTPATIENT_CLINIC_OR_DEPARTMENT_OTHER): Payer: 59 | Admitting: Oncology

## 2010-10-31 DIAGNOSIS — C880 Waldenstrom macroglobulinemia: Secondary | ICD-10-CM

## 2010-11-05 ENCOUNTER — Encounter (HOSPITAL_BASED_OUTPATIENT_CLINIC_OR_DEPARTMENT_OTHER): Payer: 59 | Admitting: Oncology

## 2010-11-05 ENCOUNTER — Other Ambulatory Visit: Payer: Self-pay | Admitting: Oncology

## 2010-11-05 DIAGNOSIS — Z5112 Encounter for antineoplastic immunotherapy: Secondary | ICD-10-CM

## 2010-11-05 DIAGNOSIS — R630 Anorexia: Secondary | ICD-10-CM

## 2010-11-05 DIAGNOSIS — C880 Waldenstrom macroglobulinemia: Secondary | ICD-10-CM

## 2010-11-05 DIAGNOSIS — D649 Anemia, unspecified: Secondary | ICD-10-CM

## 2010-11-05 LAB — CBC WITH DIFFERENTIAL/PLATELET
Basophils Absolute: 0 10*3/uL (ref 0.0–0.1)
EOS%: 0.7 % (ref 0.0–7.0)
HGB: 13 g/dL (ref 13.0–17.1)
LYMPH%: 13.8 % — ABNORMAL LOW (ref 14.0–49.0)
MCH: 29.5 pg (ref 27.2–33.4)
MCV: 88 fL (ref 79.3–98.0)
MONO%: 8.6 % (ref 0.0–14.0)
Platelets: 221 10*3/uL (ref 140–400)
RBC: 4.4 10*6/uL (ref 4.20–5.82)
RDW: 13.9 % (ref 11.0–14.6)

## 2011-01-02 ENCOUNTER — Ambulatory Visit (HOSPITAL_BASED_OUTPATIENT_CLINIC_OR_DEPARTMENT_OTHER): Payer: 59 | Admitting: Oncology

## 2011-01-02 ENCOUNTER — Other Ambulatory Visit: Payer: Self-pay | Admitting: Oncology

## 2011-01-02 VITALS — BP 99/60 | HR 72 | Temp 97.2°F | Ht 71.0 in | Wt 130.2 lb

## 2011-01-02 DIAGNOSIS — R5381 Other malaise: Secondary | ICD-10-CM

## 2011-01-02 DIAGNOSIS — Z5112 Encounter for antineoplastic immunotherapy: Secondary | ICD-10-CM

## 2011-01-02 DIAGNOSIS — R630 Anorexia: Secondary | ICD-10-CM

## 2011-01-02 DIAGNOSIS — C880 Waldenstrom macroglobulinemia: Secondary | ICD-10-CM

## 2011-01-02 DIAGNOSIS — D649 Anemia, unspecified: Secondary | ICD-10-CM

## 2011-01-02 LAB — CBC WITH DIFFERENTIAL/PLATELET
BASO%: 0.4 % (ref 0.0–2.0)
EOS%: 2.9 % (ref 0.0–7.0)
Eosinophils Absolute: 0.1 10*3/uL (ref 0.0–0.5)
LYMPH%: 26.8 % (ref 14.0–49.0)
MCHC: 33.5 g/dL (ref 32.0–36.0)
MCV: 89.5 fL (ref 79.3–98.0)
MONO%: 10.1 % (ref 0.0–14.0)
NEUT#: 2.7 10*3/uL (ref 1.5–6.5)
Platelets: 242 10*3/uL (ref 140–400)
RBC: 4 10*6/uL — ABNORMAL LOW (ref 4.20–5.82)
RDW: 13.7 % (ref 11.0–14.6)

## 2011-01-03 LAB — COMPREHENSIVE METABOLIC PANEL
ALT: 10 U/L (ref 0–53)
AST: 15 U/L (ref 0–37)
Albumin: 4.2 g/dL (ref 3.5–5.2)
Alkaline Phosphatase: 53 U/L (ref 39–117)
Potassium: 4.1 mEq/L (ref 3.5–5.3)
Sodium: 138 mEq/L (ref 135–145)
Total Bilirubin: 0.8 mg/dL (ref 0.3–1.2)
Total Protein: 7.9 g/dL (ref 6.0–8.3)

## 2011-01-03 LAB — IGM: IgM, Serum: 2600 mg/dL — ABNORMAL HIGH (ref 41–251)

## 2011-01-29 ENCOUNTER — Encounter: Payer: Self-pay | Admitting: Oncology

## 2011-02-13 ENCOUNTER — Other Ambulatory Visit: Payer: Self-pay | Admitting: Oncology

## 2011-02-13 ENCOUNTER — Encounter: Payer: Self-pay | Admitting: Oncology

## 2011-02-13 DIAGNOSIS — K219 Gastro-esophageal reflux disease without esophagitis: Secondary | ICD-10-CM | POA: Insufficient documentation

## 2011-02-13 DIAGNOSIS — C880 Waldenstrom macroglobulinemia: Secondary | ICD-10-CM | POA: Insufficient documentation

## 2011-02-24 ENCOUNTER — Other Ambulatory Visit: Payer: Self-pay | Admitting: Oncology

## 2011-02-27 ENCOUNTER — Other Ambulatory Visit: Payer: Self-pay | Admitting: Oncology

## 2011-02-27 ENCOUNTER — Ambulatory Visit (HOSPITAL_BASED_OUTPATIENT_CLINIC_OR_DEPARTMENT_OTHER): Payer: 59 | Admitting: Oncology

## 2011-02-27 ENCOUNTER — Other Ambulatory Visit (HOSPITAL_BASED_OUTPATIENT_CLINIC_OR_DEPARTMENT_OTHER): Payer: 59 | Admitting: Lab

## 2011-02-27 ENCOUNTER — Telehealth: Payer: Self-pay | Admitting: Oncology

## 2011-02-27 ENCOUNTER — Ambulatory Visit (HOSPITAL_BASED_OUTPATIENT_CLINIC_OR_DEPARTMENT_OTHER): Payer: 59

## 2011-02-27 VITALS — BP 107/68 | HR 79 | Temp 97.0°F

## 2011-02-27 VITALS — BP 116/73 | HR 67 | Temp 96.8°F | Ht 71.0 in | Wt 131.1 lb

## 2011-02-27 DIAGNOSIS — C8800 Waldenstrom macroglobulinemia not having achieved remission: Secondary | ICD-10-CM

## 2011-02-27 DIAGNOSIS — Z5112 Encounter for antineoplastic immunotherapy: Secondary | ICD-10-CM

## 2011-02-27 LAB — CBC WITH DIFFERENTIAL/PLATELET
Basophils Absolute: 0.1 10*3/uL (ref 0.0–0.1)
Eosinophils Absolute: 0.1 10*3/uL (ref 0.0–0.5)
HCT: 36.3 % — ABNORMAL LOW (ref 38.4–49.9)
HGB: 12.5 g/dL — ABNORMAL LOW (ref 13.0–17.1)
MONO#: 0.6 10*3/uL (ref 0.1–0.9)
NEUT#: 2.9 10*3/uL (ref 1.5–6.5)
NEUT%: 55.6 % (ref 39.0–75.0)
RDW: 13.5 % (ref 11.0–14.6)
WBC: 5.2 10*3/uL (ref 4.0–10.3)
lymph#: 1.5 10*3/uL (ref 0.9–3.3)

## 2011-02-27 MED ORDER — SODIUM CHLORIDE 0.9 % IJ SOLN
10.0000 mL | INTRAMUSCULAR | Status: DC | PRN
  Filled 2011-02-27: qty 10

## 2011-02-27 MED ORDER — SODIUM CHLORIDE 0.9 % IV SOLN
Freq: Once | INTRAVENOUS | Status: AC
  Administered 2011-02-27: 10:00:00 via INTRAVENOUS

## 2011-02-27 MED ORDER — SODIUM CHLORIDE 0.9 % IV SOLN
375.0000 mg/m2 | Freq: Once | INTRAVENOUS | Status: AC
  Administered 2011-02-27: 600 mg via INTRAVENOUS
  Filled 2011-02-27: qty 60

## 2011-02-27 MED ORDER — DIPHENHYDRAMINE HCL 25 MG PO CAPS
50.0000 mg | ORAL_CAPSULE | Freq: Once | ORAL | Status: AC
  Administered 2011-02-27: 50 mg via ORAL

## 2011-02-27 MED ORDER — ACETAMINOPHEN 325 MG PO TABS
650.0000 mg | ORAL_TABLET | Freq: Once | ORAL | Status: AC
  Administered 2011-02-27: 650 mg via ORAL

## 2011-02-27 NOTE — Progress Notes (Signed)
At 1135, VSS,denies distress. RITUXAN infusion increased to flow at a rate of 300 mls per hour to flow at a rate of  157 mls per hour at volume of 79 mls.

## 2011-02-27 NOTE — Progress Notes (Signed)
[facility] OFFICE PROGRESS NOTE  Melody Haver, MD, MD [phone] [facility] [place] [address]  DIAGNOSIS:  symptomatic Waldenstrm macroglobulinemia  PAST THERAPY:  weekly induction Rituxan x 4 doses started on 06/02/2010.  CURRENT THERAPY:  started on maintenance Rituxan q37months x 2 years with first dose on 09/05/10.  INTERVAL HISTORY: [name] 60 y.o. male returns for regular follow up with his wife.  He reports feeling great.  He has no fatigue.  His stamina is now better than last year.  He is working full time and has no problem with performance at work.  During the weekend, he has been able to do more chores.  He has good appetite and has been gaining weight subjectively.  He denies fever, chill, mucositis, shortness of breath, chest pain, abdominal pain, confusion, visual changes, focal motor weakness, neuropathy, leg weakness, back pain, bleeding symptoms, diarrhea/constipation.   MEDICAL HISTORY: Past Medical History  Diagnosis Date  . Anemia     prev bone marrow bx 02/11/10  . GERD (gastroesophageal reflux disease)   . Colitis 2004  . Anxiety   . 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ALLERGIES:   has no known allergies.  MEDICATIONS: Current Outpatient Prescriptions  Medication Sig Dispense Refill  . dexlansoprazole (DEXILANT) 60 MG capsule Take 60 mg by mouth daily.        . diazepam (VALIUM) 5 MG tablet Take 5 mg by mouth every 12 (twelve) hours as needed.         No current facility-administered medications for this visit.   Facility-Administered Medications Ordered in Other Visits  Medication Dose Route Frequency Provider Last Rate Last Dose  . 0.9 %  sodium chloride infusion   Intravenous Once Jethro Bolus, MD      . acetaminophen (TYLENOL) tablet 650 mg  650 mg Oral Once Jethro Bolus, MD      . diphenhydrAMINE (BENADRYL) capsule 50 mg  50 mg Oral Once Jethro Bolus, MD      . riTUXimab (RITUXAN) 600  mg in sodium chloride 0.9 % 250 mL chemo infusion  375 mg/m2 (Treatment Plan Actual) Intravenous Once Jethro Bolus, MD      . sodium chloride 0.9 % injection 10 mL  10 mL Intracatheter PRN Jethro Bolus, MD        SURGICAL HISTORY:  Past Surgical History  Procedure Date  . Nasal fracture surgery     REVIEW OF SYSTEMS:  Pertinent items are noted in HPI.   Filed Vitals:   02/27/11 0913  BP: 116/73  Pulse: 67  Temp: 96.8 F (36 C)   Wt Readings from Last 3 Encounters:  02/27/11 131 lb 1.6 oz (59.467 kg)  01/02/11 130 lb 4 oz (59.081 kg)  01/29/11 130 lb 4 oz (59.081 kg)    PHYSICAL EXAMINATION:   General:  well-nourished in no acute distress.  Eyes:  no scleral icterus.  ENT:  There were no oropharyngeal lesions.  Neck was without thyromegaly.  Lymphatics:  Negative cervical, supraclavicular or axillary adenopathy.  Respiratory: lungs were clear bilaterally without wheezing or crackles.  Cardiovascular:  Regular rate and rhythm, S1/S2, without murmur, rub or gallop.  There was no pedal edema.  GI:  abdomen was soft, flat, nontender, nondistended, without organomegaly.  Muscoloskeletal:  no spinal tenderness of palpation of vertebral spine.  Skin exam was without echymosis, petichae.  Neuro exam was nonfocal.  Patient was able to get on and off exam  table without assistance.  Gait was normal.  Patient was alerted and oriented.  Attention was good.   Language was appropriate.  Mood was normal without depression.  Speech was not pressured.  Thought content was not tangential.    LABORATORY/RADIOLOGY DATA:  Lab Results  Component Value Date   WBC 5.2 02/27/2011   HGB 12.5* 02/27/2011   HCT 36.3* 02/27/2011   PLT 257 02/27/2011   GLUCOSE 87 01/02/2011   GLUCOSE 87 01/02/2011   ALT 10 01/02/2011   ALT 10 01/02/2011   AST 15 01/02/2011   AST 15 01/02/2011   NA 138 01/02/2011   NA 138 01/02/2011   K 4.1 01/02/2011   K 4.1 01/02/2011   CL 101 01/02/2011   CL 101 01/02/2011   CREATININE 0.98 01/02/2011     CREATININE 0.98 01/02/2011   BUN 21 01/02/2011   BUN 21 01/02/2011   CO2 25 01/02/2011   CO2 25 01/02/2011   INR 1.22 08/13/2010    ASSESSMENT AND PLAN:  1. Waldenstrom macroglobulinemia with fatigue and B symptoms.  He is doing relatively well on maintenance Rituxan once every 2 months.  He has continued to do well and he has improved B symptoms.  I recommended to proceed with the next cycle today.  He has an appointment to see me in 2 months before the next cycle of Rituxan.  2. Age-appropriate cancer screening:  He is due for his colonoscopy.  I discussed with him that he not on chemotherapy, he is only receiving Rituxan which is antibody to CD20.  He should be able to receive colonoscopy between the doses of Rituxan without significantly increased risk of infection.  He is due to have this done with Dr. Ewing Schlein soon. 3. Dental care:  He has no contraindication to undergo routine dental care with his dentist Dr. Breck Coons.  Rituxan does not cause cytopenia or high risk of infection like traditional cytotoxic chemo.  4. Influenza vaccination: he should get this in one month to allow best immune response from the vaccine.

## 2011-02-27 NOTE — Progress Notes (Signed)
At 1205, VSS,denies distress, infusion rate of RITUXAN increased  To flow at 400 mg per hour to reflect a rate of 210 mls per hour for a volume of 105 mls.

## 2011-02-27 NOTE — Telephone Encounter (Signed)
gve the pt his jan 2013 appt calendar °

## 2011-02-27 NOTE — Progress Notes (Signed)
At 1105, VSS,denies  Distress.  RITUXAN infusion increased to  Flow at a rate of 200 mg per hour for a flow rate of 105 mls per hour for a volume of 53 mls.

## 2011-02-27 NOTE — Progress Notes (Signed)
Pt 1305, VSS,denies distress, infusion completed at 1305

## 2011-02-27 NOTE — Progress Notes (Signed)
At 1235, denies distress, infusing at 210 mls per hour.

## 2011-02-27 NOTE — Progress Notes (Signed)
At 1035,  VSS,denies distress, RITUXAN started at  A dose of 100 mg per hour to flow at 53 mls per hour for  A volume of 27 mls.

## 2011-03-03 LAB — PROTEIN ELECTROPHORESIS, SERUM
Albumin ELP: 51 % — ABNORMAL LOW (ref 55.8–66.1)
Total Protein, Serum Electrophoresis: 7.7 g/dL (ref 6.0–8.3)

## 2011-03-03 LAB — COMPREHENSIVE METABOLIC PANEL
ALT: 12 U/L (ref 0–53)
AST: 17 U/L (ref 0–37)
Chloride: 101 mEq/L (ref 96–112)
Creatinine, Ser: 0.92 mg/dL (ref 0.50–1.35)
Sodium: 139 mEq/L (ref 135–145)
Total Bilirubin: 0.7 mg/dL (ref 0.3–1.2)
Total Protein: 7.7 g/dL (ref 6.0–8.3)

## 2011-03-17 ENCOUNTER — Ambulatory Visit (HOSPITAL_COMMUNITY): Payer: 59 | Admitting: Anesthesiology

## 2011-03-17 ENCOUNTER — Ambulatory Visit (HOSPITAL_COMMUNITY)
Admission: RE | Admit: 2011-03-17 | Discharge: 2011-03-17 | Disposition: A | Discharge status: Home / Self Care | Payer: 59 | Source: Ambulatory Visit | Attending: Gastroenterology | Admitting: Gastroenterology

## 2011-03-17 ENCOUNTER — Encounter (HOSPITAL_COMMUNITY): Payer: Self-pay | Admitting: Anesthesiology

## 2011-03-17 ENCOUNTER — Encounter (HOSPITAL_COMMUNITY): Payer: Self-pay | Admitting: *Deleted

## 2011-03-17 ENCOUNTER — Encounter (HOSPITAL_COMMUNITY)
Admission: RE | Disposition: A | Discharge status: Home / Self Care | Payer: Self-pay | Source: Ambulatory Visit | Attending: Gastroenterology

## 2011-03-17 ENCOUNTER — Other Ambulatory Visit: Payer: Self-pay | Admitting: Gastroenterology

## 2011-03-17 DIAGNOSIS — K5730 Diverticulosis of large intestine without perforation or abscess without bleeding: Secondary | ICD-10-CM | POA: Insufficient documentation

## 2011-03-17 DIAGNOSIS — C419 Malignant neoplasm of bone and articular cartilage, unspecified: Secondary | ICD-10-CM | POA: Insufficient documentation

## 2011-03-17 DIAGNOSIS — Z09 Encounter for follow-up examination after completed treatment for conditions other than malignant neoplasm: Secondary | ICD-10-CM | POA: Insufficient documentation

## 2011-03-17 DIAGNOSIS — G4730 Sleep apnea, unspecified: Secondary | ICD-10-CM | POA: Insufficient documentation

## 2011-03-17 DIAGNOSIS — D126 Benign neoplasm of colon, unspecified: Secondary | ICD-10-CM | POA: Insufficient documentation

## 2011-03-17 DIAGNOSIS — Z79899 Other long term (current) drug therapy: Secondary | ICD-10-CM | POA: Insufficient documentation

## 2011-03-17 DIAGNOSIS — K648 Other hemorrhoids: Secondary | ICD-10-CM | POA: Insufficient documentation

## 2011-03-17 DIAGNOSIS — K644 Residual hemorrhoidal skin tags: Secondary | ICD-10-CM | POA: Insufficient documentation

## 2011-03-17 HISTORY — DX: Sleep apnea, unspecified: G47.30

## 2011-03-17 HISTORY — PX: COLONOSCOPY: SHX5424

## 2011-03-17 SURGERY — COLONOSCOPY
Anesthesia: Monitor Anesthesia Care

## 2011-03-17 MED ORDER — FENTANYL CITRATE 0.05 MG/ML IJ SOLN
INTRAMUSCULAR | Status: DC | PRN
  Administered 2011-03-17: 50 ug via INTRAVENOUS
  Administered 2011-03-17 (×2): 25 ug via INTRAVENOUS

## 2011-03-17 MED ORDER — KETAMINE HCL 10 MG/ML IJ SOLN
INTRAMUSCULAR | Status: DC | PRN
  Administered 2011-03-17 (×2): 10 mg via INTRAVENOUS

## 2011-03-17 MED ORDER — SODIUM CHLORIDE 0.9 % IV SOLN: Freq: Once | INTRAVENOUS | Status: DC

## 2011-03-17 MED ORDER — LACTATED RINGERS IV SOLN
INTRAVENOUS | Status: DC
  Administered 2011-03-17: 1000 mL via INTRAVENOUS

## 2011-03-17 MED ORDER — MIDAZOLAM HCL 5 MG/5ML IJ SOLN
INTRAMUSCULAR | Status: DC | PRN
  Administered 2011-03-17 (×2): 1 mg via INTRAVENOUS

## 2011-03-17 MED ORDER — ONDANSETRON HCL 4 MG/2ML IJ SOLN
INTRAMUSCULAR | Status: DC | PRN
  Administered 2011-03-17: 2 mg via INTRAVENOUS

## 2011-03-17 MED ORDER — PROPOFOL 10 MG/ML IV EMUL
INTRAVENOUS | Status: DC | PRN
  Administered 2011-03-17: 40 ug/kg/min via INTRAVENOUS

## 2011-03-17 NOTE — Anesthesia Preprocedure Evaluation (Addendum)
Anesthesia Evaluation  Patient identified by MRN, date of birth, ID band Patient awake    Reviewed: Allergy & Precautions, H&P , NPO status , Patient's Chart, lab work & pertinent test results  Airway Mallampati: II TM Distance: >3 FB Neck ROM: Full    Dental No notable dental hx. (+) Teeth Intact and Dental Advisory Given   Pulmonary neg pulmonary ROS, sleep apnea and Continuous Positive Airway Pressure Ventilation ,  clear to auscultation  Pulmonary exam normal       Cardiovascular neg cardio ROS Regular Normal    Neuro/Psych Negative Neurological ROS  Negative Psych ROS   GI/Hepatic negative GI ROS, Neg liver ROS, GERD-  ,  Endo/Other  Negative Endocrine ROS  Renal/GU negative Renal ROS  Genitourinary negative   Musculoskeletal negative musculoskeletal ROS (+)   Abdominal   Peds negative pediatric ROS (+)  Hematology Waldenstroms macroglobulinemia; presently receiving chemotherapy   Anesthesia Other Findings   Reproductive/Obstetrics negative OB ROS                           Anesthesia Physical Anesthesia Plan  ASA: III  Anesthesia Plan: MAC   Post-op Pain Management:    Induction:   Airway Management Planned:   Additional Equipment:   Intra-op Plan:   Post-operative Plan:   Informed Consent: I have reviewed the patients History and Physical, chart, labs and discussed the procedure including the risks, benefits and alternatives for the proposed anesthesia with the patient or authorized representative who has indicated his/her understanding and acceptance.   Dental advisory given  Plan Discussed with: CRNA  Anesthesia Plan Comments:         Anesthesia Quick Evaluation

## 2011-03-17 NOTE — Anesthesia Postprocedure Evaluation (Signed)
Anesthesia Post Note  Patient: ANGELES PAOLUCCI  Procedure(s) Performed:  COLONOSCOPY  Anesthesia type: MAC  Patient location: PACU  Post pain: Pain level controlled  Post assessment: Post-op Vital signs reviewed  Last Vitals:  Filed Vitals:   03/17/11 1445  BP: 148/68  Pulse:   Temp:   Resp: 19    Post vital signs: Reviewed  Level of consciousness: sedated  Complications: No apparent anesthesia complications

## 2011-03-17 NOTE — Transfer of Care (Signed)
Immediate Anesthesia Transfer of Care Note  Patient: [name]  Procedure(s) Performed:  COLONOSCOPY  Patient Location: PACU  Anesthesia Type: MAC  Level of Consciousness: awake  Airway & Oxygen Therapy: Patient Spontanous Breathing and Patient connected to face mask  Post-op Assessment: Report given to PACU RN and Post -op Vital signs reviewed and stable  Post vital signs: Reviewed and stable  Complications: No apparent anesthesia complications

## 2011-03-17 NOTE — H&P (Signed)
  The patient was seen and examined and interviewed in preop area and please see scanned history for details without any new changes

## 2011-03-18 ENCOUNTER — Encounter (HOSPITAL_COMMUNITY): Payer: Self-pay | Admitting: Gastroenterology

## 2011-04-10 ENCOUNTER — Other Ambulatory Visit: Payer: Self-pay | Admitting: Oncology

## 2011-04-12 ENCOUNTER — Telehealth: Payer: Self-pay | Admitting: Oncology

## 2011-04-12 NOTE — Telephone Encounter (Signed)
Pt's wife is aware of the r/s appts from 04/24/2011 to 05/08/2011

## 2011-04-14 HISTORY — PX: HIP FRACTURE SURGERY: SHX118

## 2011-04-22 ENCOUNTER — Other Ambulatory Visit: Payer: 59 | Admitting: Lab

## 2011-04-24 ENCOUNTER — Ambulatory Visit: Payer: 59 | Admitting: Oncology

## 2011-04-24 ENCOUNTER — Ambulatory Visit: Payer: 59

## 2011-04-26 ENCOUNTER — Encounter: Payer: Self-pay | Admitting: Oncology

## 2011-05-08 ENCOUNTER — Ambulatory Visit (HOSPITAL_BASED_OUTPATIENT_CLINIC_OR_DEPARTMENT_OTHER): Payer: 59 | Admitting: Oncology

## 2011-05-08 ENCOUNTER — Other Ambulatory Visit (HOSPITAL_BASED_OUTPATIENT_CLINIC_OR_DEPARTMENT_OTHER): Payer: 59 | Admitting: Lab

## 2011-05-08 ENCOUNTER — Ambulatory Visit (HOSPITAL_BASED_OUTPATIENT_CLINIC_OR_DEPARTMENT_OTHER): Payer: 59

## 2011-05-08 VITALS — BP 103/57 | HR 73 | Temp 98.1°F

## 2011-05-08 VITALS — BP 120/74 | HR 70 | Temp 97.6°F | Ht 71.0 in | Wt 128.5 lb

## 2011-05-08 DIAGNOSIS — G2581 Restless legs syndrome: Secondary | ICD-10-CM

## 2011-05-08 DIAGNOSIS — C880 Waldenstrom macroglobulinemia: Secondary | ICD-10-CM

## 2011-05-08 DIAGNOSIS — Z5112 Encounter for antineoplastic immunotherapy: Secondary | ICD-10-CM

## 2011-05-08 DIAGNOSIS — F39 Unspecified mood [affective] disorder: Secondary | ICD-10-CM

## 2011-05-08 DIAGNOSIS — G4700 Insomnia, unspecified: Secondary | ICD-10-CM

## 2011-05-08 LAB — CBC WITH DIFFERENTIAL/PLATELET
Basophils Absolute: 0.1 10*3/uL (ref 0.0–0.1)
Eosinophils Absolute: 0 10*3/uL (ref 0.0–0.5)
HCT: 37.9 % — ABNORMAL LOW (ref 38.4–49.9)
HGB: 12.6 g/dL — ABNORMAL LOW (ref 13.0–17.1)
LYMPH%: 30.1 % (ref 14.0–49.0)
MCV: 87.9 fL (ref 79.3–98.0)
MONO%: 13.2 % (ref 0.0–14.0)
NEUT#: 2.8 10*3/uL (ref 1.5–6.5)
NEUT%: 55.5 % (ref 39.0–75.0)
Platelets: 318 10*3/uL (ref 140–400)
RDW: 13.5 % (ref 11.0–14.6)

## 2011-05-08 MED ORDER — SODIUM CHLORIDE 0.9 % IV SOLN
Freq: Once | INTRAVENOUS | Status: AC
  Administered 2011-05-08: 11:00:00 via INTRAVENOUS

## 2011-05-08 MED ORDER — SODIUM CHLORIDE 0.9 % IV SOLN
375.0000 mg/m2 | Freq: Once | INTRAVENOUS | Status: AC
  Administered 2011-05-08: 600 mg via INTRAVENOUS
  Filled 2011-05-08: qty 60

## 2011-05-08 MED ORDER — DIPHENHYDRAMINE HCL 25 MG PO CAPS
50.0000 mg | ORAL_CAPSULE | Freq: Once | ORAL | Status: AC
  Administered 2011-05-08: 50 mg via ORAL

## 2011-05-08 MED ORDER — ACETAMINOPHEN 325 MG PO TABS
650.0000 mg | ORAL_TABLET | Freq: Once | ORAL | Status: AC
  Administered 2011-05-08: 650 mg via ORAL

## 2011-05-08 NOTE — Progress Notes (Signed)
1125: Rituxan started at 100mg /hr, rate 53 mL/hr X 27 mL. 1155: Rituxan rate increased to 200 mg/hr, rate 105 mL/hr X 53 mL  Vital signs stable, no complaints from patient. 1225: Rituxan rate increased to 300 mg/hr, rate 158 mL/hr X 79 mL. Vital signs stable, no complaints. 1255: Rituxan rate increased to 400 mg/hr, rate 210 mL/hr X 105 mL.  Vital signs stable, no complaints.

## 2011-05-08 NOTE — Progress Notes (Signed)
[facility] OFFICE PROGRESS NOTE  Melody Haver, MD, MD [id number] [place] [address]  DIAGNOSIS:  symptomatic Waldenstrm macroglobulinemia  PAST THERAPY:  weekly induction Rituxan x 4 doses started on 06/02/2010.  CURRENT THERAPY:  started on maintenance Rituxan q29months x 2 years with first dose on 09/05/10.  INTERVAL HISTORY: [name] 61 y.o. male returns for regular follow up with his wife.  He has restless leg at night.  His wife thinks that he is moody and cranky.  He denies depression.  He and his wife think that his stamina/strengh is much better now than 2 years ago prior to starting Rituxan. He denies fever/anorexia, weight loss, headache, diplopia, seizure, nausea/vomiting; bleeding symptoms.  He had 2 wks ago productive cough but symptoms have completely resolved with antibiotics per PCP.   MEDICAL HISTORY: Past Medical History  Diagnosis Date  . Anemia     prev bone marrow bx 02/11/10  . GERD (gastroesophageal reflux disease)   . Colitis 2004  . Anxiety   . 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. Sleep apnea 09-12-10    Uses CPAP    ALLERGIES:   has no known allergies.  MEDICATIONS: Current Outpatient Prescriptions  Medication Sig Dispense Refill  . dexlansoprazole (DEXILANT) 60 MG capsule Take 60 mg by mouth daily.        . diazepam (VALIUM) 5 MG tablet Take 5 mg by mouth every 12 (twelve) hours as needed.         No current facility-administered medications for this visit.   Facility-Administered Medications Ordered in Other Visits  Medication Dose Route Frequency Provider Last Rate Last Dose  . 0.9 %  sodium chloride infusion   Intravenous Once Jethro Bolus, MD 20 mL/hr at 05/08/11 1105    . acetaminophen (TYLENOL) tablet 650 mg  650 mg Oral Once Jethro Bolus, MD   650 mg at 05/08/11 1107  . diphenhydrAMINE (BENADRYL) capsule 50 mg  50 mg Oral Once Jethro Bolus, MD   50 mg at 05/08/11 1107  . riTUXimab (RITUXAN) 600 mg in sodium  chloride 0.9 % 250 mL chemo infusion  375 mg/m2 (Treatment Plan Actual) Intravenous Once Jethro Bolus, MD   600 mg at 05/08/11 1125    SURGICAL HISTORY:  Past Surgical History  Procedure Date  . Nasal fracture surgery   . Colonoscopy 03/17/2011    Procedure: COLONOSCOPY;  Surgeon: Petra Kuba, MD;  Location: [facility];  Service: Endoscopy;  Laterality: N/A;    REVIEW OF SYSTEMS:  Pertinent items are noted in HPI.   Filed Vitals:   05/08/11 0959  BP: 120/74  Pulse: 70  Temp: 97.6 F (36.4 C)   Wt Readings from Last 3 Encounters:  05/08/11 128 lb 8 oz (58.287 kg)  03/17/11 132 lb (59.875 kg)  03/17/11 132 lb (59.875 kg)  ECOG: 0  PHYSICAL EXAMINATION:   General:  Thin-appearing male, in no acute distress.  Eyes:  no scleral icterus.  ENT:  There were no oropharyngeal lesions.  Neck was without thyromegaly.  Lymphatics:  Negative cervical, supraclavicular or axillary adenopathy.  Respiratory: lungs were clear bilaterally without wheezing or crackles.  Cardiovascular:  Regular rate and rhythm, S1/S2, without murmur, rub or gallop.  There was no pedal edema.  GI:  abdomen was soft, flat, nontender, nondistended, without organomegaly.  Muscoloskeletal:  no spinal tenderness of palpation of vertebral spine.  Skin exam was without echymosis, petichae.  Neuro exam was nonfocal.  Patient was  able to get on and off exam table without assistance.  Gait was normal.  Patient was alerted and oriented.  Attention was good.   Language was appropriate.  Mood was normal without depression.  Speech was not pressured.  Thought content was not tangential.    LABORATORY/RADIOLOGY DATA: WBC 5.1; Hgb 12.6; Plt 318.  CMET; SPEP; IgM pending.      ASSESSMENT AND PLAN:  1. Waldenstrom macroglobulinemia with fatigue and B symptoms at presentation.  He is doing relatively well on maintenance Rituxan once every 2 months.  He has continued to do well and he has improved B symptoms. He has one episode of URI  this winter which is normal for him.  This is not severe enough to be labeled as side effects of Rituxan.   I recommended to proceed with the next cycle today.  He has an appointment to see me in 2 months before the next cycle of Rituxan. 2. Restless leg syndrome:  Not known side effects of Rituxan. I encouraged him to see his PCP for appropriate work up/treatment.  3. Mood swing:  He denied depression. However, he does have insomnia.  Again, I encouraged him to see his PCP for appropriate work up/treatment.  4. Follow up:  Lab/RV with me in 2 months.

## 2011-05-12 ENCOUNTER — Telehealth: Payer: Self-pay | Admitting: Oncology

## 2011-05-12 LAB — PROTEIN ELECTROPHORESIS, SERUM
Albumin ELP: 54.7 % — ABNORMAL LOW (ref 55.8–66.1)
Beta Globulin: 5.1 % (ref 4.7–7.2)
Total Protein, Serum Electrophoresis: 8 g/dL (ref 6.0–8.3)

## 2011-05-12 LAB — COMPREHENSIVE METABOLIC PANEL
CO2: 27 mEq/L (ref 19–32)
Glucose, Bld: 83 mg/dL (ref 70–99)
Sodium: 139 mEq/L (ref 135–145)
Total Bilirubin: 0.8 mg/dL (ref 0.3–1.2)
Total Protein: 8 g/dL (ref 6.0–8.3)

## 2011-05-12 NOTE — Telephone Encounter (Signed)
l/m that dr Gaylyn Rong gave an ok for tx to be on 3/29 at 10:00  and to keep lab and md on 3/20      aom

## 2011-05-13 ENCOUNTER — Inpatient Hospital Stay: Payer: Self-pay | Admitting: Orthopedic Surgery

## 2011-05-13 LAB — CBC WITH DIFFERENTIAL/PLATELET
Basophil #: 0 10*3/uL (ref 0.0–0.1)
Basophil %: 0.2 %
Eosinophil #: 0 10*3/uL (ref 0.0–0.7)
Eosinophil %: 0.1 %
HCT: 35.6 % — ABNORMAL LOW (ref 40.0–52.0)
HGB: 11.7 g/dL — ABNORMAL LOW (ref 13.0–18.0)
Lymphocyte #: 0.7 10*3/uL — ABNORMAL LOW (ref 1.0–3.6)
Lymphocyte %: 5.6 %
MCH: 29.7 pg (ref 26.0–34.0)
MCHC: 32.9 g/dL (ref 32.0–36.0)
MCV: 90 fL (ref 80–100)
Monocyte #: 0.5 10*3/uL (ref 0.0–0.7)
Monocyte %: 4 %
Neutrophil #: 10.7 10*3/uL — ABNORMAL HIGH (ref 1.4–6.5)
Neutrophil %: 90.1 %
Platelet: 240 10*3/uL (ref 150–440)
RBC: 3.94 10*6/uL — ABNORMAL LOW (ref 4.40–5.90)
RDW: 13.7 % (ref 11.5–14.5)
WBC: 11.9 10*3/uL — ABNORMAL HIGH (ref 3.8–10.6)

## 2011-05-13 LAB — COMPREHENSIVE METABOLIC PANEL
Albumin: 3.6 g/dL (ref 3.4–5.0)
Alkaline Phosphatase: 72 U/L (ref 50–136)
Anion Gap: 9 (ref 7–16)
BUN: 32 mg/dL — ABNORMAL HIGH (ref 7–18)
Bilirubin,Total: 0.6 mg/dL (ref 0.2–1.0)
Calcium, Total: 8.8 mg/dL (ref 8.5–10.1)
Chloride: 106 mmol/L (ref 98–107)
Co2: 27 mmol/L (ref 21–32)
Creatinine: 0.83 mg/dL (ref 0.60–1.30)
EGFR (African American): >60
EGFR (Non-African Amer.): >60
Glucose: 90 mg/dL (ref 65–99)
Osmolality: 290 (ref 275–301)
Potassium: 3.8 mmol/L (ref 3.5–5.1)
SGOT(AST): 23 U/L (ref 15–37)
SGPT (ALT): 23 U/L
Sodium: 142 mmol/L (ref 136–145)
Total Protein: 7.4 g/dL (ref 6.4–8.2)

## 2011-05-13 LAB — URINALYSIS, COMPLETE
Bacteria: NONE SEEN
Bilirubin,UR: NEGATIVE
Blood: NEGATIVE
Glucose,UR: NEGATIVE mg/dL (ref 0–75)
Leukocyte Esterase: NEGATIVE
Nitrite: NEGATIVE
Ph: 7 (ref 4.5–8.0)
Protein: NEGATIVE
RBC,UR: <1 /HPF (ref 0–5)
Specific Gravity: 1.013 (ref 1.003–1.030)
Squamous Epithelial: NONE SEEN
WBC UR: 2 /HPF (ref 0–5)

## 2011-05-13 LAB — PROTIME-INR: INR: 1

## 2011-05-14 LAB — BASIC METABOLIC PANEL
Anion Gap: 9 (ref 7–16)
Calcium, Total: 8.1 mg/dL — ABNORMAL LOW (ref 8.5–10.1)
EGFR (African American): >60
Glucose: 96 mg/dL (ref 65–99)
Osmolality: 280 (ref 275–301)
Potassium: 3.6 mmol/L (ref 3.5–5.1)

## 2011-05-14 LAB — CBC WITH DIFFERENTIAL/PLATELET
Basophil #: 0 10*3/uL (ref 0.0–0.1)
Basophil %: 0.4 %
Eosinophil #: 0 10*3/uL (ref 0.0–0.7)
Eosinophil %: 0.4 %
HCT: 32 % — ABNORMAL LOW (ref 40.0–52.0)
HGB: 10.6 g/dL — ABNORMAL LOW (ref 13.0–18.0)
Lymphocyte #: 1.3 10*3/uL (ref 1.0–3.6)
Monocyte %: 8.3 %
Neutrophil #: 5.6 10*3/uL (ref 1.4–6.5)
Neutrophil %: 73.5 %
RBC: 3.54 10*6/uL — ABNORMAL LOW (ref 4.40–5.90)
WBC: 7.6 10*3/uL (ref 3.8–10.6)

## 2011-05-15 ENCOUNTER — Ambulatory Visit: Payer: Self-pay | Admitting: Internal Medicine

## 2011-05-15 LAB — CBC WITH DIFFERENTIAL/PLATELET
Eosinophil #: 0 10*3/uL (ref 0.0–0.7)
Eosinophil %: 0 %
HGB: 8.8 g/dL — ABNORMAL LOW (ref 13.0–18.0)
Lymphocyte #: 0.7 10*3/uL — ABNORMAL LOW (ref 1.0–3.6)
MCH: 29.9 pg (ref 26.0–34.0)
MCV: 90 fL (ref 80–100)
Monocyte #: 1.2 10*3/uL — ABNORMAL HIGH (ref 0.0–0.7)
Monocyte %: 9.2 %
Neutrophil %: 85.7 %
RBC: 2.94 10*6/uL — ABNORMAL LOW (ref 4.40–5.90)
WBC: 13 10*3/uL — ABNORMAL HIGH (ref 3.8–10.6)

## 2011-05-15 LAB — BASIC METABOLIC PANEL
Calcium, Total: 7.8 mg/dL — ABNORMAL LOW (ref 8.5–10.1)
Chloride: 102 mmol/L (ref 98–107)
Creatinine: 1.04 mg/dL (ref 0.60–1.30)
Glucose: 137 mg/dL — ABNORMAL HIGH (ref 65–99)
Osmolality: 282 (ref 275–301)
Potassium: 3.9 mmol/L (ref 3.5–5.1)
Sodium: 139 mmol/L (ref 136–145)

## 2011-05-17 LAB — CBC WITH DIFFERENTIAL/PLATELET
Basophil #: 0 10*3/uL (ref 0.0–0.1)
Eosinophil #: 0 10*3/uL (ref 0.0–0.7)
Eosinophil %: 0.4 %
HCT: 23.4 % — ABNORMAL LOW (ref 40.0–52.0)
HGB: 8.1 g/dL — ABNORMAL LOW (ref 13.0–18.0)
Lymphocyte #: 0.9 10*3/uL — ABNORMAL LOW (ref 1.0–3.6)
MCH: 31.4 pg (ref 26.0–34.0)
MCHC: 34.9 g/dL (ref 32.0–36.0)
MCV: 90 fL (ref 80–100)
Monocyte #: 0.5 10*3/uL (ref 0.0–0.7)
Monocyte %: 6.5 %
Neutrophil #: 6.6 10*3/uL — ABNORMAL HIGH (ref 1.4–6.5)
Neutrophil %: 82.3 %
RDW: 12.7 % (ref 11.5–14.5)

## 2011-05-17 LAB — URINALYSIS, COMPLETE
Bacteria: NONE SEEN
Blood: NEGATIVE
Glucose,UR: NEGATIVE mg/dL (ref 0–75)
Ketone: NEGATIVE
Leukocyte Esterase: NEGATIVE
Nitrite: NEGATIVE
Ph: 8 (ref 4.5–8.0)
RBC,UR: 1 /HPF (ref 0–5)
Specific Gravity: 1.009 (ref 1.003–1.030)

## 2011-05-18 LAB — CBC WITH DIFFERENTIAL/PLATELET
Basophil #: 0 x10 3/mm 3
Basophil #: 0 x10 3/mm 3
Basophil %: 0.4 %
Basophil %: 0.2 %
Eosinophil #: 0.1 x10 3/mm 3
Eosinophil #: 0 x10 3/mm 3
Eosinophil %: 0.9 %
Eosinophil %: 0.5 %
HCT: 23 % — ABNORMAL LOW
HCT: 25.6 % — ABNORMAL LOW
HGB: 7.6 g/dL — ABNORMAL LOW
HGB: 8.5 g/dL — ABNORMAL LOW
Lymphocyte %: 12.8 %
Lymphocyte %: 8.8 %
Lymphs Abs: 0.9 x10 3/mm 3 — ABNORMAL LOW
Lymphs Abs: 0.7 x10 3/mm 3 — ABNORMAL LOW
MCH: 29.7 pg
MCH: 29.9 pg
MCHC: 33 g/dL
MCHC: 33.1 g/dL
MCV: 90 fL
MCV: 90 fL
Monocyte #: 0.6 x10 3/mm 3
Monocyte #: 0.5 x10 3/mm 3
Monocyte %: 9.2 %
Monocyte %: 7.3 %
Neutrophil #: 5.4 x10 3/mm 3
Neutrophil #: 6.2 x10 3/mm 3
Neutrophil %: 76.7 %
Neutrophil %: 83.2 %
Platelet: 202 x10 3/mm 3
Platelet: 217 x10 3/mm 3
RBC: 2.56 x10 6/mm 3 — ABNORMAL LOW
RBC: 2.83 x10 6/mm 3 — ABNORMAL LOW
RDW: 14 %
RDW: 13.9 %
WBC: 7.1 x10 3/mm 3
WBC: 7.4 x10 3/mm 3

## 2011-05-18 LAB — BASIC METABOLIC PANEL
BUN: 12 mg/dL (ref 7–18)
Co2: 28 mmol/L (ref 21–32)
Creatinine: 0.94 mg/dL (ref 0.60–1.30)
EGFR (African American): >60
EGFR (Non-African Amer.): >60

## 2011-05-18 LAB — URINE CULTURE

## 2011-05-19 LAB — CBC WITH DIFFERENTIAL/PLATELET
Basophil %: 0 %
Eosinophil #: 0 10*3/uL (ref 0.0–0.7)
Eosinophil %: 0.1 %
HGB: 9.1 g/dL — ABNORMAL LOW (ref 13.0–18.0)
Lymphocyte #: 0.6 10*3/uL — ABNORMAL LOW (ref 1.0–3.6)
MCH: 29.6 pg (ref 26.0–34.0)
MCHC: 33.3 g/dL (ref 32.0–36.0)
MCV: 89 fL (ref 80–100)
Monocyte #: 0.8 10*3/uL — ABNORMAL HIGH (ref 0.0–0.7)
Neutrophil #: 8.8 10*3/uL — ABNORMAL HIGH (ref 1.4–6.5)
Neutrophil %: 86 %
Platelet: 219 10*3/uL (ref 150–440)
RBC: 3.06 10*6/uL — ABNORMAL LOW (ref 4.40–5.90)

## 2011-05-20 LAB — BASIC METABOLIC PANEL
Anion Gap: 9 (ref 7–16)
Calcium, Total: 8.6 mg/dL (ref 8.5–10.1)
Chloride: 98 mmol/L (ref 98–107)
Co2: 30 mmol/L (ref 21–32)
Creatinine: 0.8 mg/dL (ref 0.60–1.30)
Osmolality: 276 (ref 275–301)

## 2011-05-20 LAB — CBC WITH DIFFERENTIAL/PLATELET
Basophil #: 0 10*3/uL (ref 0.0–0.1)
Basophil %: 0.3 %
Eosinophil #: 0.2 10*3/uL (ref 0.0–0.7)
HCT: 25.7 % — ABNORMAL LOW (ref 40.0–52.0)
HGB: 8.9 g/dL — ABNORMAL LOW (ref 13.0–18.0)
Lymphocyte #: 0.6 10*3/uL — ABNORMAL LOW (ref 1.0–3.6)
Lymphocyte %: 7.4 %
MCH: 30.6 pg (ref 26.0–34.0)
MCHC: 34.7 g/dL (ref 32.0–36.0)
MCV: 88 fL (ref 80–100)
Monocyte #: 0.8 10*3/uL — ABNORMAL HIGH (ref 0.0–0.7)
Neutrophil %: 80.5 %
Platelet: 318 10*3/uL (ref 150–440)
WBC: 8 10*3/uL (ref 3.8–10.6)

## 2011-05-22 LAB — CBC WITH DIFFERENTIAL/PLATELET
Basophil %: 0.2 %
Eosinophil #: 0.4 10*3/uL (ref 0.0–0.7)
Eosinophil %: 5.6 %
HGB: 8.6 g/dL — ABNORMAL LOW (ref 13.0–18.0)
Lymphocyte #: 0.5 10*3/uL — ABNORMAL LOW (ref 1.0–3.6)
MCH: 29.7 pg (ref 26.0–34.0)
MCV: 89 fL (ref 80–100)
Monocyte #: 0.7 10*3/uL (ref 0.0–0.7)
Monocyte %: 8.8 %
Neutrophil %: 78.2 %
Platelet: 482 10*3/uL — ABNORMAL HIGH (ref 150–440)
RBC: 2.91 10*6/uL — ABNORMAL LOW (ref 4.40–5.90)
WBC: 7.7 10*3/uL (ref 3.8–10.6)

## 2011-06-09 ENCOUNTER — Encounter (HOSPITAL_COMMUNITY): Payer: Self-pay

## 2011-06-12 ENCOUNTER — Ambulatory Visit: Payer: Self-pay | Admitting: Internal Medicine

## 2011-07-01 ENCOUNTER — Other Ambulatory Visit (HOSPITAL_BASED_OUTPATIENT_CLINIC_OR_DEPARTMENT_OTHER): Payer: 59 | Admitting: Lab

## 2011-07-01 ENCOUNTER — Ambulatory Visit (HOSPITAL_BASED_OUTPATIENT_CLINIC_OR_DEPARTMENT_OTHER): Payer: 59 | Admitting: Oncology

## 2011-07-01 ENCOUNTER — Telehealth: Payer: Self-pay | Admitting: Oncology

## 2011-07-01 ENCOUNTER — Encounter: Payer: Self-pay | Admitting: Oncology

## 2011-07-01 ENCOUNTER — Ambulatory Visit: Payer: 59

## 2011-07-01 VITALS — BP 106/67 | HR 81 | Temp 97.0°F | Ht 71.0 in | Wt 126.6 lb

## 2011-07-01 DIAGNOSIS — C8800 Waldenstrom macroglobulinemia not having achieved remission: Secondary | ICD-10-CM

## 2011-07-01 DIAGNOSIS — Z9889 Other specified postprocedural states: Secondary | ICD-10-CM

## 2011-07-01 LAB — CBC WITH DIFFERENTIAL/PLATELET
BASO%: 0.5 % (ref 0.0–2.0)
EOS%: 3 % (ref 0.0–7.0)
MCH: 30 pg (ref 27.2–33.4)
MCHC: 33.8 g/dL (ref 32.0–36.0)
MONO#: 0.3 10*3/uL (ref 0.1–0.9)
RBC: 4.59 10*6/uL (ref 4.20–5.82)
RDW: 15.8 % — ABNORMAL HIGH (ref 11.0–14.6)
WBC: 4.9 10*3/uL (ref 4.0–10.3)
lymph#: 1.2 10*3/uL (ref 0.9–3.3)

## 2011-07-01 NOTE — Progress Notes (Signed)
[name] OFFICE PROGRESS NOTE  Cc:  Melody Haver, MD, MD  DIAGNOSIS: Symptomatic Waldenstrm macroglobulinemia  PAST THERAPY: Weekly induction Rituxan x 4 doses started on 06/02/2010.  CURRENT THERAPY: Started on maintenance Rituxan q7months x 2 years with first dose on 09/05/10.  INTERVAL HISTORY: [name] 61 y.o. male returns for regular follow up with his wife. Since his last visit, the patient has fractured his right hip. He had surgery performed at the end of January 2013 at [facility]. He is now undergoing outpatient rehabilitation. He is walking with a cane. Not currently working. Rare use of PRN pain medications. The patient reports that he received 2 units of PRBCs during his hospitalization around the time of his surgery. He denies fever/anorexia, weight loss, headache, diplopia, seizure, nausea/vomiting; bleeding symptoms.   Past Medical History  Diagnosis Date  . Anemia     prev bone marrow bx 02/11/10  . GERD (gastroesophageal reflux disease)   . Colitis 2004  . Anxiety   . 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. Sleep apnea 09-12-10    Uses CPAP    Past Surgical History  Procedure Date  . Nasal fracture surgery   . Colonoscopy 03/17/2011    Procedure: COLONOSCOPY;  Surgeon: Petra Kuba, MD;  Location: [facility];  Service: Endoscopy;  Laterality: N/A;    Current Outpatient Prescriptions  Medication Sig Dispense Refill  . dexlansoprazole (DEXILANT) 60 MG capsule Take 60 mg by mouth daily.        . diazepam (VALIUM) 5 MG tablet Take 5 mg by mouth every 12 (twelve) hours as needed.          ALLERGIES:   has no known allergies.  REVIEW OF SYSTEMS:  The rest of the 14-point review of system was negative.   Filed Vitals:   07/01/11 0834  BP: 106/67  Pulse: 81  Temp: 97 F (36.1 C)   Wt Readings from Last 3 Encounters:  07/01/11 126 lb 9.6 oz (57.425 kg)  05/08/11 128 lb 8 oz (58.287 kg)  03/17/11 132 lb (59.875 kg)   ECOG  Performance status: 0-1  PHYSICAL EXAMINATION: General: Thin-appearing male, in no acute distress. Eyes: no scleral icterus. ENT: There were no oropharyngeal lesions. Neck was without thyromegaly. Lymphatics: Negative cervical, supraclavicular or axillary adenopathy. Respiratory: lungs were clear bilaterally without wheezing or crackles. Cardiovascular: Regular rate and rhythm, S1/S2, without murmur, rub or gallop. There was no pedal edema. GI: abdomen was soft, flat, nontender, nondistended, without organomegaly. Muscoloskeletal: no spinal tenderness of palpation of vertebral spine. Skin exam was without echymosis, petichae. Right hip surgical incision is well approximated. Steri-strips intact. No redness or drainage. Neuro exam was nonfocal. Patient was able to get on and off exam table without assistance. Gait was normal. Patient was alerted and oriented. Attention was good. Language was appropriate. Mood was normal without depression. Speech was not pressured. Thought content was not tangential.    LABORATORY/RADIOLOGY DATA:  Lab Results  Component Value Date   WBC 4.9 07/01/2011   HGB 13.8 07/01/2011   HCT 40.7 07/01/2011   PLT 338 07/01/2011   GLUCOSE 83 05/08/2011   ALT 9 05/08/2011   AST 16 05/08/2011   NA 139 05/08/2011   K 4.1 05/08/2011   CL 101 05/08/2011   CREATININE 0.99 05/08/2011   BUN 22 05/08/2011   CO2 27 05/08/2011   INR 1.22 08/13/2010    ASSESSMENT AND PLAN:  1. Waldenstrom macroglobulinemia with fatigue and B symptoms at presentation.  He is doing relatively well on maintenance Rituxan once every 2 months. He has continued to do well and he has improved B symptoms. He will proceed with his next cycle of Rituxan on 07/10/11 as scheduled. He will be given an appointment to be seen by Dr Gaylyn Rong with labs and Rituxan in 2 months. 2. Right hip fracture. The patient is recovering well from his fracture and subsequent surgery. He will continue outpatient rehabilitation as directed and will  follow-up with orthopedics. 3. Follow up: Follow up visit with labs and Rituxan in 2 months.        [name] was seen and examined with Dr Gaylyn Rong. >90% of plan of care was developed by Dr Gaylyn Rong. The length of time of the face-to-face encounter was 30 minutes. More than 50% of time was spent counseling and coordination of care.

## 2011-07-01 NOTE — Telephone Encounter (Signed)
appts made and printed for pt aom °

## 2011-07-03 LAB — PROTEIN ELECTROPHORESIS, SERUM
Alpha-2-Globulin: 10.6 % (ref 7.1–11.8)
Beta 2: 19 % — ABNORMAL HIGH (ref 3.2–6.5)
Beta Globulin: 5.1 % (ref 4.7–7.2)
M-Spike, %: 1.13 g/dL
Total Protein, Serum Electrophoresis: 7.5 g/dL (ref 6.0–8.3)

## 2011-07-03 LAB — COMPREHENSIVE METABOLIC PANEL
ALT: 17 U/L (ref 0–53)
AST: 19 U/L (ref 0–37)
Albumin: 4.4 g/dL (ref 3.5–5.2)
CO2: 25 mEq/L (ref 19–32)
Calcium: 9.7 mg/dL (ref 8.4–10.5)
Chloride: 103 mEq/L (ref 96–112)
Creatinine, Ser: 1.07 mg/dL (ref 0.50–1.35)
Potassium: 4.7 mEq/L (ref 3.5–5.3)
Sodium: 140 mEq/L (ref 135–145)
Total Protein: 7.5 g/dL (ref 6.0–8.3)

## 2011-07-08 ENCOUNTER — Other Ambulatory Visit: Payer: 59 | Admitting: Lab

## 2011-07-08 ENCOUNTER — Ambulatory Visit: Payer: 59 | Admitting: Oncology

## 2011-07-10 ENCOUNTER — Ambulatory Visit (HOSPITAL_BASED_OUTPATIENT_CLINIC_OR_DEPARTMENT_OTHER): Payer: 59

## 2011-07-10 VITALS — BP 106/66 | HR 79 | Temp 97.9°F

## 2011-07-10 DIAGNOSIS — C880 Waldenstrom macroglobulinemia: Secondary | ICD-10-CM

## 2011-07-10 DIAGNOSIS — Z5112 Encounter for antineoplastic immunotherapy: Secondary | ICD-10-CM

## 2011-07-10 MED ORDER — SODIUM CHLORIDE 0.9 % IV SOLN
Freq: Once | INTRAVENOUS | Status: AC
  Administered 2011-07-10: 10:00:00 via INTRAVENOUS

## 2011-07-10 MED ORDER — ACETAMINOPHEN 325 MG PO TABS
650.0000 mg | ORAL_TABLET | Freq: Once | ORAL | Status: AC
  Administered 2011-07-10: 650 mg via ORAL

## 2011-07-10 MED ORDER — DIPHENHYDRAMINE HCL 25 MG PO CAPS
50.0000 mg | ORAL_CAPSULE | Freq: Once | ORAL | Status: AC
  Administered 2011-07-10: 50 mg via ORAL

## 2011-07-10 MED ORDER — SODIUM CHLORIDE 0.9 % IV SOLN
375.0000 mg/m2 | Freq: Once | INTRAVENOUS | Status: AC
  Administered 2011-07-10: 600 mg via INTRAVENOUS
  Filled 2011-07-10: qty 60

## 2011-07-10 NOTE — Patient Instructions (Signed)
[facility] Discharge Instructions for Patients Receiving Chemotherapy  Today you received the following chemotherapy agents  Rituxan  To help prevent nausea and vomiting after your treatment, we encourage you to take your nausea medication as prescibed Begin taking it at this evening and take it as often as prescribed for the next 2-3 days as needed.   If you develop nausea and vomiting that is not controlled by your nausea medication, call the clinic. If it is after clinic hours your family physician or the after hours number for the clinic or go to the Emergency Department.   BELOW ARE SYMPTOMS THAT SHOULD BE REPORTED IMMEDIATELY:  *FEVER GREATER THAN 100.5 F  *CHILLS WITH OR WITHOUT FEVER  NAUSEA AND VOMITING THAT IS NOT CONTROLLED WITH YOUR NAUSEA MEDICATION  *UNUSUAL SHORTNESS OF BREATH  *UNUSUAL BRUISING OR BLEEDING  TENDERNESS IN MOUTH AND THROAT WITH OR WITHOUT PRESENCE OF ULCERS  *URINARY PROBLEMS  *BOWEL PROBLEMS  UNUSUAL RASH Items with * indicate a potential emergency and should be followed up as soon as possible.  One of the nurses will contact you 24 hours after your treatment. Please let the nurse know about any problems that you may have experienced. Feel free to call the clinic you have any questions or concerns. The clinic phone number is [phone].   I have been informed and understand all the instructions given to me. I know to contact the clinic, my physician, or go to the Emergency Department if any problems should occur. I do not have any questions at this time, but understand that I may call the clinic during office hours   should I have any questions or need assistance in obtaining follow up care.    __________________________________________  _____________  __________ Signature of Patient or Authorized Representative            Date                   Time    __________________________________________ Nurse's Signature

## 2011-08-21 ENCOUNTER — Other Ambulatory Visit: Payer: Self-pay | Admitting: Oncology

## 2011-08-21 ENCOUNTER — Ambulatory Visit: Payer: Self-pay | Admitting: Orthopedic Surgery

## 2011-08-28 ENCOUNTER — Ambulatory Visit (HOSPITAL_BASED_OUTPATIENT_CLINIC_OR_DEPARTMENT_OTHER): Payer: 59 | Admitting: Oncology

## 2011-08-28 ENCOUNTER — Ambulatory Visit (HOSPITAL_BASED_OUTPATIENT_CLINIC_OR_DEPARTMENT_OTHER): Payer: 59

## 2011-08-28 ENCOUNTER — Other Ambulatory Visit (HOSPITAL_BASED_OUTPATIENT_CLINIC_OR_DEPARTMENT_OTHER): Payer: 59 | Admitting: Lab

## 2011-08-28 ENCOUNTER — Telehealth: Payer: Self-pay | Admitting: Oncology

## 2011-08-28 VITALS — BP 99/61 | HR 89 | Temp 99.0°F

## 2011-08-28 VITALS — BP 115/71 | HR 76 | Temp 97.2°F | Wt 134.5 lb

## 2011-08-28 DIAGNOSIS — Z5112 Encounter for antineoplastic immunotherapy: Secondary | ICD-10-CM

## 2011-08-28 DIAGNOSIS — C8800 Waldenstrom macroglobulinemia not having achieved remission: Secondary | ICD-10-CM

## 2011-08-28 LAB — COMPREHENSIVE METABOLIC PANEL
ALT: 15 U/L (ref 0–53)
AST: 19 U/L (ref 0–37)
BUN: 18 mg/dL (ref 6–23)
Calcium: 9.6 mg/dL (ref 8.4–10.5)
Creatinine, Ser: 0.95 mg/dL (ref 0.50–1.35)
Total Bilirubin: 1 mg/dL (ref 0.3–1.2)

## 2011-08-28 LAB — CBC WITH DIFFERENTIAL/PLATELET
BASO%: 0.6 % (ref 0.0–2.0)
HCT: 40.4 % (ref 38.4–49.9)
LYMPH%: 33.5 % (ref 14.0–49.0)
MCH: 28.9 pg (ref 27.2–33.4)
MCHC: 34.4 g/dL (ref 32.0–36.0)
MCV: 84 fL (ref 79.3–98.0)
MONO%: 12.8 % (ref 0.0–14.0)
NEUT%: 50.2 % (ref 39.0–75.0)
Platelets: 247 10*3/uL (ref 140–400)
RBC: 4.81 10*6/uL (ref 4.20–5.82)
nRBC: 0 % (ref 0–0)

## 2011-08-28 MED ORDER — SODIUM CHLORIDE 0.9 % IV SOLN
Freq: Once | INTRAVENOUS | Status: AC
  Administered 2011-08-28: 10:00:00 via INTRAVENOUS

## 2011-08-28 MED ORDER — ACETAMINOPHEN 325 MG PO TABS
650.0000 mg | ORAL_TABLET | Freq: Once | ORAL | Status: AC
  Administered 2011-08-28: 650 mg via ORAL

## 2011-08-28 MED ORDER — SODIUM CHLORIDE 0.9 % IV SOLN
375.0000 mg/m2 | Freq: Once | INTRAVENOUS | Status: AC
  Administered 2011-08-28: 600 mg via INTRAVENOUS
  Filled 2011-08-28: qty 60

## 2011-08-28 MED ORDER — DIPHENHYDRAMINE HCL 25 MG PO CAPS
50.0000 mg | ORAL_CAPSULE | Freq: Once | ORAL | Status: AC
  Administered 2011-08-28: 50 mg via ORAL

## 2011-08-28 NOTE — Progress Notes (Signed)
[facility]  Telephone:([phone]) [phone] [phone]) [phone]   OFFICE PROGRESS NOTE   Cc:  Melody Haver, MD, MD  DIAGNOSIS: symptomatic Waldenstrm macroglobulinemia   PAST THERAPY: weekly induction Rituxan x 4 doses started on 06/02/2010.   CURRENT THERAPY: started on maintenance Rituxan q16months x 2 years with first dose on 09/05/10.   INTERVAL HISTORY: [name] 61 y.o. male returns for regular follow up with his wife.  He still undergoes PT for the recent right femur fracture operation.  He is able to ambulate everyday.  With prolonged ambulation, he has mild to moderate pain in the right femur rod.  The pain has been improving over the last few weeks.   He otherwise reports feeling well.   Patient denies fatigue, headache, visual changes, confusion, drenching night sweats, palpable lymph node swelling, mucositis, odynophagia, dysphagia, nausea vomiting, jaundice, chest pain, palpitation, shortness of breath, dyspnea on exertion, productive cough, gum bleeding, epistaxis, hematemesis, hemoptysis, abdominal pain, abdominal swelling, early satiety, melena, hematochezia, hematuria, skin rash, spontaneous bleeding, joint swelling, joint pain, heat or cold intolerance, bowel bladder incontinence, back pain, paresthesia, depression, suicidal or homocidal ideation, feeling hopelessness.   Past Medical History  Diagnosis Date  . Anemia     prev bone marrow bx 02/11/10  . GERD (gastroesophageal reflux disease)   . Colitis 2004  . Anxiety   . 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. Sleep apnea 09-12-10    Uses CPAP    Past Surgical History  Procedure Date  . Nasal fracture surgery   . Colonoscopy 03/17/2011    Procedure: COLONOSCOPY;  Surgeon: Petra Kuba, MD;  Location: [facility];  Service: Endoscopy;  Laterality: N/A;  . Hip fracture surgery January 2013    R hip    Current Outpatient Prescriptions  Medication Sig Dispense Refill  . dexlansoprazole  (DEXILANT) 60 MG capsule Take 60 mg by mouth daily.        . diazepam (VALIUM) 5 MG tablet Take 5 mg by mouth every 12 (twelve) hours as needed.         No current facility-administered medications for this visit.   Facility-Administered Medications Ordered in Other Visits  Medication Dose Route Frequency Provider Last Rate Last Dose  . 0.9 %  sodium chloride infusion   Intravenous Once Exie Parody, MD 20 mL/hr at 08/28/11 0950    . acetaminophen (TYLENOL) tablet 650 mg  650 mg Oral Once Exie Parody, MD   650 mg at 08/28/11 0955  . diphenhydrAMINE (BENADRYL) capsule 50 mg  50 mg Oral Once Exie Parody, MD   50 mg at 08/28/11 0955  . riTUXimab (RITUXAN) 600 mg in sodium chloride 0.9 % 250 mL chemo infusion  375 mg/m2 (Treatment Plan Actual) Intravenous Once Exie Parody, MD 158 mL/hr at 08/28/11 1130 600 mg at 08/28/11 1130    ALLERGIES:   has no known allergies.  REVIEW OF SYSTEMS:  The rest of the 14-point review of system was negative.   Filed Vitals:   08/28/11 0844  BP: 115/71  Pulse: 76  Temp: 97.2 F (36.2 C)   Wt Readings from Last 3 Encounters:  08/28/11 134 lb 8 oz (61.009 kg)  07/01/11 126 lb 9.6 oz (57.425 kg)  05/08/11 128 lb 8 oz (58.287 kg)   ECOG Performance status: 1 due to recent femur fracture.   PHYSICAL EXAMINATION:   General:  Thin-appearing man, in no acute distress.  Eyes:  no scleral icterus.  ENT:  There were no oropharyngeal lesions.  Neck was without thyromegaly.  Lymphatics:  Negative cervical, supraclavicular or axillary adenopathy.  Respiratory: lungs were clear bilaterally without wheezing or crackles.  Cardiovascular:  Regular rate and rhythm, S1/S2, without murmur, rub or gallop.  There was no pedal edema.  GI:  abdomen was soft, flat, nontender, nondistended, without organomegaly.  Muscoloskeletal:  no spinal tenderness of palpation of vertebral spine.  Skin exam was without echymosis, petichae.  Neuro exam was nonfocal.  Patient was able to get on and off  exam table with some assistance.  Gait was slightly measured with weight bearing on the right leg.  Patient was alerted and oriented.  Attention was good.   Language was appropriate.  Mood was normal without depression.  Speech was not pressured.  Thought content was not tangential.      LABORATORY/RADIOLOGY DATA:  Lab Results  Component Value Date   WBC 3.4* 08/28/2011   HGB 13.9 08/28/2011   HCT 40.4 08/28/2011   PLT 247 08/28/2011   GLUCOSE 98 07/01/2011   ALKPHOS 72 07/01/2011   ALT 17 07/01/2011   AST 19 07/01/2011   NA 140 07/01/2011   K 4.7 07/01/2011   CL 103 07/01/2011   CREATININE 1.07 07/01/2011   BUN 18 07/01/2011   CO2 25 07/01/2011   INR 1.22 08/13/2010     ASSESSMENT AND PLAN:   1.  Waldenstrom macroglobulinemia with fatigue and B symptoms at presentation. He is doing relatively well on maintenance Rituxan once every 2 months.  He has had no side effect of Rituxan.  His performance status and disease control are good.  I recommended to patient to continue with maintenance Rituxan today.   2.  Restless leg syndrome: stable.   3.  Right femur pain:  As expected for recent surgery for fracture.  He is progressing well with PT.   4.   Follow up: Lab/RV with Rituxan in 2 months   The length of time of the face-to-face encounter was 15 minutes. More than 50% of time was spent counseling and coordination of care.

## 2011-08-28 NOTE — Telephone Encounter (Signed)
appts made and printed for pt aom °

## 2011-08-28 NOTE — Patient Instructions (Signed)
[facility] Discharge Instructions for Patients Receiving Chemotherapy  Today you received the following chemotherapy agents Rituxan   To help prevent nausea and vomiting after your treatment, we encourage you to take your nausea medication as prescribed If you develop nausea and vomiting that is not controlled by your nausea medication, call the clinic. If it is after clinic hours your family physician or the after hours number for the clinic or go to the Emergency Department.   BELOW ARE SYMPTOMS THAT SHOULD BE REPORTED IMMEDIATELY:  *FEVER GREATER THAN 100.5 F  *CHILLS WITH OR WITHOUT FEVER  NAUSEA AND VOMITING THAT IS NOT CONTROLLED WITH YOUR NAUSEA MEDICATION  *UNUSUAL SHORTNESS OF BREATH  *UNUSUAL BRUISING OR BLEEDING  TENDERNESS IN MOUTH AND THROAT WITH OR WITHOUT PRESENCE OF ULCERS  *URINARY PROBLEMS  *BOWEL PROBLEMS  UNUSUAL RASH Items with * indicate a potential emergency and should be followed up as soon as possible.  One of the nurses will contact you 24 hours after your treatment. Please let the nurse know about any problems that you may have experienced. Feel free to call the clinic you have any questions or concerns. The clinic phone number is [phone].   I have been informed and understand all the instructions given to me. I know to contact the clinic, my physician, or go to the Emergency Department if any problems should occur. I do not have any questions at this time, but understand that I may call the clinic during office hours   should I have any questions or need assistance in obtaining follow up care.    __________________________________________  _____________  __________ Signature of Patient or Authorized Representative            Date                   Time    __________________________________________ Nurse's Signature    

## 2011-09-26 ENCOUNTER — Ambulatory Visit: Payer: Self-pay | Admitting: Orthopedic Surgery

## 2011-10-23 ENCOUNTER — Other Ambulatory Visit: Payer: Self-pay | Admitting: Oncology

## 2011-10-26 ENCOUNTER — Other Ambulatory Visit (HOSPITAL_BASED_OUTPATIENT_CLINIC_OR_DEPARTMENT_OTHER): Payer: 59

## 2011-10-26 ENCOUNTER — Ambulatory Visit (HOSPITAL_BASED_OUTPATIENT_CLINIC_OR_DEPARTMENT_OTHER): Payer: 59

## 2011-10-26 ENCOUNTER — Encounter: Payer: Self-pay | Admitting: Oncology

## 2011-10-26 ENCOUNTER — Ambulatory Visit (HOSPITAL_BASED_OUTPATIENT_CLINIC_OR_DEPARTMENT_OTHER): Payer: 59 | Admitting: Oncology

## 2011-10-26 VITALS — BP 125/77 | HR 85 | Temp 97.2°F | Ht 71.0 in | Wt 138.0 lb

## 2011-10-26 VITALS — BP 98/58 | HR 84 | Temp 98.3°F

## 2011-10-26 DIAGNOSIS — Z5112 Encounter for antineoplastic immunotherapy: Secondary | ICD-10-CM

## 2011-10-26 DIAGNOSIS — C8800 Waldenstrom macroglobulinemia not having achieved remission: Secondary | ICD-10-CM

## 2011-10-26 LAB — CBC WITH DIFFERENTIAL/PLATELET
Basophils Absolute: 0 10*3/uL (ref 0.0–0.1)
Eosinophils Absolute: 0.1 10*3/uL (ref 0.0–0.5)
HGB: 13.3 g/dL (ref 13.0–17.1)
MCV: 84.4 fL (ref 79.3–98.0)
MONO#: 0.4 10*3/uL (ref 0.1–0.9)
MONO%: 7.5 % (ref 0.0–14.0)
NEUT#: 4.1 10*3/uL (ref 1.5–6.5)
RDW: 14.4 % (ref 11.0–14.6)
WBC: 5.7 10*3/uL (ref 4.0–10.3)

## 2011-10-26 LAB — COMPREHENSIVE METABOLIC PANEL
ALT: 17 U/L (ref 0–53)
Albumin: 4.8 g/dL (ref 3.5–5.2)
CO2: 25 mEq/L (ref 19–32)
Calcium: 10.2 mg/dL (ref 8.4–10.5)
Chloride: 102 mEq/L (ref 96–112)
Glucose, Bld: 86 mg/dL (ref 70–99)
Potassium: 3.9 mEq/L (ref 3.5–5.3)
Sodium: 141 mEq/L (ref 135–145)
Total Bilirubin: 1.4 mg/dL — ABNORMAL HIGH (ref 0.3–1.2)
Total Protein: 8 g/dL (ref 6.0–8.3)

## 2011-10-26 LAB — LACTATE DEHYDROGENASE: LDH: 134 U/L (ref 94–250)

## 2011-10-26 MED ORDER — SODIUM CHLORIDE 0.9 % IV SOLN
Freq: Once | INTRAVENOUS | Status: AC
  Administered 2011-10-26: 10:00:00 via INTRAVENOUS

## 2011-10-26 MED ORDER — SODIUM CHLORIDE 0.9 % IV SOLN
375.0000 mg/m2 | Freq: Once | INTRAVENOUS | Status: AC
  Administered 2011-10-26: 600 mg via INTRAVENOUS
  Filled 2011-10-26: qty 60

## 2011-10-26 MED ORDER — ACETAMINOPHEN 325 MG PO TABS
650.0000 mg | ORAL_TABLET | Freq: Once | ORAL | Status: AC
  Administered 2011-10-26: 650 mg via ORAL

## 2011-10-26 MED ORDER — DIPHENHYDRAMINE HCL 25 MG PO CAPS
50.0000 mg | ORAL_CAPSULE | Freq: Once | ORAL | Status: AC
  Administered 2011-10-26: 50 mg via ORAL

## 2011-10-26 NOTE — Patient Instructions (Signed)
[facility] Discharge Instructions for Patients Receiving Chemotherapy  Today you received the following chemotherapy agents Rituxan   To help prevent nausea and vomiting after your treatment, we encourage you to take your nausea medication as prescribed If you develop nausea and vomiting that is not controlled by your nausea medication, call the clinic. If it is after clinic hours your family physician or the after hours number for the clinic or go to the Emergency Department.   BELOW ARE SYMPTOMS THAT SHOULD BE REPORTED IMMEDIATELY:  *FEVER GREATER THAN 100.5 F  *CHILLS WITH OR WITHOUT FEVER  NAUSEA AND VOMITING THAT IS NOT CONTROLLED WITH YOUR NAUSEA MEDICATION  *UNUSUAL SHORTNESS OF BREATH  *UNUSUAL BRUISING OR BLEEDING  TENDERNESS IN MOUTH AND THROAT WITH OR WITHOUT PRESENCE OF ULCERS  *URINARY PROBLEMS  *BOWEL PROBLEMS  UNUSUAL RASH Items with * indicate a potential emergency and should be followed up as soon as possible.  One of the nurses will contact you 24 hours after your treatment. Please let the nurse know about any problems that you may have experienced. Feel free to call the clinic you have any questions or concerns. The clinic phone number is [phone].   I have been informed and understand all the instructions given to me. I know to contact the clinic, my physician, or go to the Emergency Department if any problems should occur. I do not have any questions at this time, but understand that I may call the clinic during office hours   should I have any questions or need assistance in obtaining follow up care.    __________________________________________  _____________  __________ Signature of Patient or Authorized Representative            Date                   Time    __________________________________________ Nurse's Signature    

## 2011-10-26 NOTE — Progress Notes (Signed)
[facility]  Telephone:(336) [phone] Fax:(336) [phone]   OFFICE PROGRESS NOTE   Cc:  Melody Haver, MD  DIAGNOSIS: symptomatic Waldenstrm macroglobulinemia   PAST THERAPY: weekly induction Rituxan x 4 doses started on 06/02/2010.   CURRENT THERAPY: started on maintenance Rituxan q32months x 2 years with first dose on 09/05/10.   INTERVAL HISTORY: [name] 61 y.o. male returns for regular follow up with his wife. He is able to ambulate everyday; he is using a cane.  With prolonged ambulation, he has mild to moderate pain in the right femur rod.  The pain has been improving over the last few weeks.   He otherwise reports feeling well. The patient may return to work in about 2 weeks. Now using Neurontin and reports this helps.   Patient denies fatigue, headache, visual changes, confusion, drenching night sweats, palpable lymph node swelling, mucositis, odynophagia, dysphagia, nausea vomiting, jaundice, chest pain, palpitation, shortness of breath, dyspnea on exertion, productive cough, gum bleeding, epistaxis, hematemesis, hemoptysis, abdominal pain, abdominal swelling, early satiety, melena, hematochezia, hematuria, skin rash, spontaneous bleeding, joint swelling, joint pain, heat or cold intolerance, bowel bladder incontinence, back pain, paresthesia, depression, suicidal or homocidal ideation, feeling hopelessness.   Past Medical History  Diagnosis Date  . Anemia     prev bone marrow bx 02/11/10  . GERD (gastroesophageal reflux disease)   . Colitis 2004  . Anxiety   . 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. Sleep apnea 09-12-10    Uses CPAP    Past Surgical History  Procedure Date  . Nasal fracture surgery   . Colonoscopy 03/17/2011    Procedure: COLONOSCOPY;  Surgeon: Petra Kuba, MD;  Location: [facility];  Service: Endoscopy;  Laterality: N/A;  . Hip fracture surgery January 2013    R hip    Current Outpatient Prescriptions  Medication Sig  Dispense Refill  . alendronate (FOSAMAX) 70 MG tablet Take 70 mg by mouth Once a week.      Marland Kitchen dexlansoprazole (DEXILANT) 60 MG capsule Take 60 mg by mouth daily.        . diazepam (VALIUM) 5 MG tablet Take 5 mg by mouth every 12 (twelve) hours as needed.        . gabapentin (NEURONTIN) 300 MG capsule Take 300 mg by mouth Daily.       No current facility-administered medications for this visit.   Facility-Administered Medications Ordered in Other Visits  Medication Dose Route Frequency Provider Last Rate Last Dose  . 0.9 %  sodium chloride infusion   Intravenous Once Exie Parody, MD 20 mL/hr at 10/26/11 1020    . acetaminophen (TYLENOL) tablet 650 mg  650 mg Oral Once Exie Parody, MD   650 mg at 10/26/11 1029  . diphenhydrAMINE (BENADRYL) capsule 50 mg  50 mg Oral Once Exie Parody, MD   50 mg at 10/26/11 1029  . riTUXimab (RITUXAN) 600 mg in sodium chloride 0.9 % 250 mL chemo infusion  375 mg/m2 (Treatment Plan Actual) Intravenous Once Exie Parody, MD 158 mL/hr at 10/26/11 1200 600 mg at 10/26/11 1200    ALLERGIES:   has no known allergies.  REVIEW OF SYSTEMS:  The rest of the 14-point review of system was negative.   Filed Vitals:   10/26/11 0916  BP: 125/77  Pulse: 85  Temp: 97.2 F (36.2 C)   Wt Readings from Last 3 Encounters:  10/26/11 138 lb (62.596 kg)  08/28/11 134 lb 8 oz (61.009  kg)  07/01/11 126 lb 9.6 oz (57.425 kg)   ECOG Performance status: 1 due to recent femur fracture.   PHYSICAL EXAMINATION:   General:  Thin-appearing man, in no acute distress.  Eyes:  no scleral icterus.  ENT:  There were no oropharyngeal lesions.  Neck was without thyromegaly.  Lymphatics:  Negative cervical, supraclavicular or axillary adenopathy.  Respiratory: lungs were clear bilaterally without wheezing or crackles.  Cardiovascular:  Regular rate and rhythm, S1/S2, without murmur, rub or gallop.  There was no pedal edema.  GI:  abdomen was soft, flat, nontender, nondistended, without organomegaly.   Muscoloskeletal:  no spinal tenderness of palpation of vertebral spine.  Skin exam was without echymosis, petichae.  Neuro exam was nonfocal.  Patient was able to get on and off exam table with some assistance.  Gait was slightly measured with weight bearing on the right leg.  Patient was alerted and oriented.  Attention was good.   Language was appropriate.  Mood was normal without depression.  Speech was not pressured.  Thought content was not tangential.     LABORATORY/RADIOLOGY DATA:  Lab Results  Component Value Date   WBC 5.7 10/26/2011   HGB 13.3 10/26/2011   HCT 38.0* 10/26/2011   PLT 259 10/26/2011   GLUCOSE 81 08/28/2011   ALKPHOS 61 08/28/2011   ALT 15 08/28/2011   AST 19 08/28/2011   NA 138 08/28/2011   K 4.4 08/28/2011   CL 101 08/28/2011   CREATININE 0.95 08/28/2011   BUN 18 08/28/2011   CO2 26 08/28/2011   INR 1.22 08/13/2010     ASSESSMENT AND PLAN:   1.  Waldenstrom macroglobulinemia with fatigue and B symptoms at presentation. He is doing relatively well on maintenance Rituxan once every 2 months.  He has had no side effects from Rituxan.  His performance status and disease control are good.  I recommended to patient to continue with maintenance Rituxan today.   2.  Restless leg syndrome: stable.   3.  Right femur pain:  As expected for recent surgery for fracture. Now off PT.  4.   Follow up: Lab/RV with Rituxan in 2 months.   The length of time of the face-to-face encounter was 30 minutes. More than 50% of time was spent counseling and coordination of care.

## 2011-10-27 ENCOUNTER — Ambulatory Visit: Payer: 59

## 2011-12-20 NOTE — Progress Notes (Signed)
Not applicable.  Before EPIC gone live.  

## 2011-12-22 ENCOUNTER — Other Ambulatory Visit: Payer: Self-pay | Admitting: Oncology

## 2011-12-28 NOTE — Patient Instructions (Addendum)
1. 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lymphoplasmacytic leukemia): Doing well on maintenance Rituxan once every 2 months. 2. Treatment: Continue with the current Rituxan regimen. Last dose is due in March 2014.  3. Followup: In about 2 months.    

## 2011-12-30 ENCOUNTER — Other Ambulatory Visit (HOSPITAL_BASED_OUTPATIENT_CLINIC_OR_DEPARTMENT_OTHER): Payer: 59 | Admitting: Lab

## 2011-12-30 ENCOUNTER — Ambulatory Visit (HOSPITAL_BASED_OUTPATIENT_CLINIC_OR_DEPARTMENT_OTHER): Payer: 59

## 2011-12-30 ENCOUNTER — Ambulatory Visit (HOSPITAL_BASED_OUTPATIENT_CLINIC_OR_DEPARTMENT_OTHER): Payer: 59 | Admitting: Oncology

## 2011-12-30 VITALS — BP 110/76 | HR 69 | Temp 96.8°F | Resp 18 | Ht 71.0 in | Wt 140.0 lb

## 2011-12-30 VITALS — BP 105/67 | HR 70 | Temp 98.5°F | Resp 20 | Ht 71.0 in | Wt 140.9 lb

## 2011-12-30 DIAGNOSIS — Z5112 Encounter for antineoplastic immunotherapy: Secondary | ICD-10-CM

## 2011-12-30 DIAGNOSIS — R5381 Other malaise: Secondary | ICD-10-CM

## 2011-12-30 DIAGNOSIS — C880 Waldenstrom macroglobulinemia: Secondary | ICD-10-CM

## 2011-12-30 DIAGNOSIS — R52 Pain, unspecified: Secondary | ICD-10-CM

## 2011-12-30 LAB — COMPREHENSIVE METABOLIC PANEL (CC13)
ALT: 9 U/L (ref 0–55)
AST: 13 U/L (ref 5–34)
Albumin: 3.8 g/dL (ref 3.5–5.0)
Alkaline Phosphatase: 64 U/L (ref 40–150)
Calcium: 9.2 mg/dL (ref 8.4–10.4)
Chloride: 106 mEq/L (ref 98–107)
Potassium: 4.2 mEq/L (ref 3.5–5.1)
Sodium: 143 mEq/L (ref 136–145)

## 2011-12-30 LAB — CBC WITH DIFFERENTIAL/PLATELET
Basophils Absolute: 0 10*3/uL (ref 0.0–0.1)
Eosinophils Absolute: 0.2 10*3/uL (ref 0.0–0.5)
HGB: 13.2 g/dL (ref 13.0–17.1)
MCV: 89 fL (ref 79.3–98.0)
MONO#: 0.4 10*3/uL (ref 0.1–0.9)
MONO%: 9.9 % (ref 0.0–14.0)
NEUT#: 2.2 10*3/uL (ref 1.5–6.5)
RDW: 14.1 % (ref 11.0–14.6)
WBC: 4.3 10*3/uL (ref 4.0–10.3)

## 2011-12-30 MED ORDER — SODIUM CHLORIDE 0.9 % IV SOLN
Freq: Once | INTRAVENOUS | Status: AC
  Administered 2011-12-30: 20 mL via INTRAVENOUS

## 2011-12-30 MED ORDER — DIPHENHYDRAMINE HCL 25 MG PO CAPS
50.0000 mg | ORAL_CAPSULE | Freq: Once | ORAL | Status: AC
  Administered 2011-12-30: 50 mg via ORAL

## 2011-12-30 MED ORDER — ACETAMINOPHEN 325 MG PO TABS
650.0000 mg | ORAL_TABLET | Freq: Once | ORAL | Status: AC
  Administered 2011-12-30: 650 mg via ORAL

## 2011-12-30 MED ORDER — SODIUM CHLORIDE 0.9 % IV SOLN
375.0000 mg/m2 | Freq: Once | INTRAVENOUS | Status: AC
  Administered 2011-12-30: 600 mg via INTRAVENOUS
  Filled 2011-12-30: qty 60

## 2011-12-30 NOTE — Progress Notes (Signed)
[facility]  Telephone:(336) [phone] Fax:(336) [phone]   OFFICE PROGRESS NOTE    DIAGNOSIS: symptomatic Waldenstrm macroglobulinemia   PAST THERAPY: weekly induction Rituxan x 4 doses started on 06/02/2010.   CURRENT THERAPY: started on maintenance Rituxan q58months x 2 years with first dose on 09/05/10.  INTERVAL HISTORY: [name] 61 y.o. male returns for regular follow up with his wife.  He reports occasional dizziness.  However, he has returned to work full time since July 2013.  He has good appetite now compared to before starting Rituxan.  He has weight gain.  Patient denies fever, anorexia, weight loss, fatigue, headache, visual changes, confusion, drenching night sweats, palpable lymph node swelling, mucositis, odynophagia, dysphagia, nausea vomiting, jaundice, chest pain, palpitation, shortness of breath, dyspnea on exertion, productive cough, gum bleeding, epistaxis, hematemesis, hemoptysis, abdominal pain, abdominal swelling, early satiety, melena, hematochezia, hematuria, skin rash, spontaneous bleeding, joint swelling, joint pain, heat or cold intolerance, bowel bladder incontinence, back pain, focal motor weakness, paresthesia, depression, suicidal or homicidal ideation, feeling hopelessness.   Past Medical History  Diagnosis Date  . Anemia     prev bone marrow bx 02/11/10  . GERD (gastroesophageal reflux disease)   . Colitis 2004  . Anxiety   . 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. Sleep apnea 09-12-10    Uses CPAP    Past Surgical History  Procedure Date  . Nasal fracture surgery   . Colonoscopy 03/17/2011    Procedure: COLONOSCOPY;  Surgeon: Petra Kuba, MD;  Location: [facility];  Service: Endoscopy;  Laterality: N/A;  . Hip fracture surgery January 2013    R hip    Current Outpatient Prescriptions  Medication Sig Dispense Refill  . alendronate (FOSAMAX) 70 MG tablet Take 70 mg by mouth Once a week.      Marland Kitchen dexlansoprazole (DEXILANT)  60 MG capsule Take 60 mg by mouth daily.        Marland Kitchen gabapentin (NEURONTIN) 300 MG capsule Take 300 mg by mouth Daily.      . diazepam (VALIUM) 5 MG tablet Take 5 mg by mouth every 12 (twelve) hours as needed.          ALLERGIES:   has no known allergies.  REVIEW OF SYSTEMS:  The rest of the 14-point review of system was negative.   Filed Vitals:   12/30/11 0847  BP: 105/67  Pulse: 70  Temp: 98.5 F (36.9 C)  Resp: 20   Wt Readings from Last 3 Encounters:  12/30/11 140 lb 14.4 oz (63.912 kg)  10/26/11 138 lb (62.596 kg)  08/28/11 134 lb 8 oz (61.009 kg)   ECOG Performance status: 0.  PHYSICAL EXAMINATION:   General: thin-appearing man, in no acute distress.  Eyes:  no scleral icterus.  ENT:  There were no oropharyngeal lesions.  Neck was without thyromegaly.  Lymphatics:  Negative cervical, supraclavicular or axillary adenopathy.  Respiratory: lungs were clear bilaterally without wheezing or crackles.  Cardiovascular:  Regular rate and rhythm, S1/S2, without murmur, rub or gallop.  There was no pedal edema.  GI:  abdomen was soft, flat, nontender, nondistended, without organomegaly.  Muscoloskeletal:  no spinal tenderness of palpation of vertebral spine.  Skin exam was without echymosis, petichae.  Neuro exam was nonfocal.  Patient was able to get on and off exam table without assistance.  Gait was normal.  Patient was alerted and oriented.  Attention was good.   Language was appropriate.  Mood was normal without depression.  Speech  was not pressured.  Thought content was not tangential.        LABORATORY/RADIOLOGY DATA:  Lab Results  Component Value Date   WBC 4.3 12/30/2011   HGB 13.2 12/30/2011   HCT 39.0 12/30/2011   PLT 256 12/30/2011   GLUCOSE 86 10/26/2011   ALKPHOS 60 10/26/2011   ALT 17 10/26/2011   AST 20 10/26/2011   NA 141 10/26/2011   K 3.9 10/26/2011   CL 102 10/26/2011   CREATININE 0.82 10/26/2011   BUN 17 10/26/2011   CO2 25 10/26/2011   INR 1.22 08/13/2010     ASSESSMENT AND PLAN:   1. Waldenstrom macroglobulinemia with fatigue and B symptoms at presentation. He is doing relatively well on maintenance Rituxan once every 2 months. He has had no side effect of Rituxan. His performance status and disease control are good. His performance status and weight have improved compared to before Rituxan. I recommended to patient to continue with maintenance Rituxan today.  2. Restless leg syndrome: stable.  3. Right femur pain: As expected for recent surgery for fracture. He is slowly recovering.  He has a little limp.  4.  Flu vac:  I advised him to get this year flu vac in about 1 month (right in the middle between the two doses of Rituxan) to ensure best response.  5. Follow up: Lab/RV with Rituxan in 2 months

## 2012-01-01 LAB — KAPPA/LAMBDA LIGHT CHAINS
Kappa free light chain: 0.21 mg/dL — ABNORMAL LOW (ref 0.33–1.94)
Kappa:Lambda Ratio: 0.12 — ABNORMAL LOW (ref 0.26–1.65)
Lambda Free Lght Chn: 1.78 mg/dL (ref 0.57–2.63)

## 2012-01-01 LAB — PROTEIN ELECTROPHORESIS, SERUM
Albumin ELP: 58.4 % (ref 55.8–66.1)
Alpha-1-Globulin: 3.7 % (ref 2.9–4.9)
Alpha-2-Globulin: 10.4 % (ref 7.1–11.8)
Beta Globulin: 5.3 % (ref 4.7–7.2)
M-Spike, %: 0.94 g/dL
Total Protein, Serum Electrophoresis: 7.1 g/dL (ref 6.0–8.3)

## 2012-02-29 ENCOUNTER — Encounter: Payer: Self-pay | Admitting: Oncology

## 2012-02-29 ENCOUNTER — Ambulatory Visit (HOSPITAL_BASED_OUTPATIENT_CLINIC_OR_DEPARTMENT_OTHER): Payer: 59

## 2012-02-29 ENCOUNTER — Ambulatory Visit (HOSPITAL_BASED_OUTPATIENT_CLINIC_OR_DEPARTMENT_OTHER): Payer: 59 | Admitting: Oncology

## 2012-02-29 ENCOUNTER — Other Ambulatory Visit (HOSPITAL_BASED_OUTPATIENT_CLINIC_OR_DEPARTMENT_OTHER): Payer: 59

## 2012-02-29 ENCOUNTER — Other Ambulatory Visit: Payer: Self-pay | Admitting: Oncology

## 2012-02-29 VITALS — BP 105/61 | HR 78 | Temp 97.0°F | Resp 20 | Ht 71.0 in | Wt 137.7 lb

## 2012-02-29 VITALS — BP 110/70 | HR 75 | Temp 97.6°F | Resp 16

## 2012-02-29 DIAGNOSIS — F329 Major depressive disorder, single episode, unspecified: Secondary | ICD-10-CM

## 2012-02-29 DIAGNOSIS — C8800 Waldenstrom macroglobulinemia not having achieved remission: Secondary | ICD-10-CM

## 2012-02-29 DIAGNOSIS — G2581 Restless legs syndrome: Secondary | ICD-10-CM

## 2012-02-29 DIAGNOSIS — C9000 Multiple myeloma not having achieved remission: Secondary | ICD-10-CM

## 2012-02-29 DIAGNOSIS — Z5112 Encounter for antineoplastic immunotherapy: Secondary | ICD-10-CM

## 2012-02-29 LAB — BASIC METABOLIC PANEL (CC13)
BUN: 22 mg/dL (ref 7.0–26.0)
CO2: 26 mEq/L (ref 22–29)
Calcium: 9.8 mg/dL (ref 8.4–10.4)
Creatinine: 1 mg/dL (ref 0.7–1.3)
Glucose: 99 mg/dl (ref 70–99)
Sodium: 140 mEq/L (ref 136–145)

## 2012-02-29 LAB — CBC WITH DIFFERENTIAL/PLATELET
Basophils Absolute: 0 10*3/uL (ref 0.0–0.1)
Eosinophils Absolute: 0.1 10*3/uL (ref 0.0–0.5)
HCT: 41.3 % (ref 38.4–49.9)
LYMPH%: 25.9 % (ref 14.0–49.0)
MCV: 87.7 fL (ref 79.3–98.0)
MONO#: 0.4 10*3/uL (ref 0.1–0.9)
MONO%: 7.9 % (ref 0.0–14.0)
NEUT#: 2.9 10*3/uL (ref 1.5–6.5)
NEUT%: 62.9 % (ref 39.0–75.0)
Platelets: 268 10*3/uL (ref 140–400)
RBC: 4.71 10*6/uL (ref 4.20–5.82)
WBC: 4.6 10*3/uL (ref 4.0–10.3)

## 2012-02-29 MED ORDER — SODIUM CHLORIDE 0.9 % IV SOLN
Freq: Once | INTRAVENOUS | Status: AC
  Administered 2012-02-29: 09:00:00 via INTRAVENOUS

## 2012-02-29 MED ORDER — ACETAMINOPHEN 325 MG PO TABS
650.0000 mg | ORAL_TABLET | Freq: Once | ORAL | Status: AC
  Administered 2012-02-29: 650 mg via ORAL

## 2012-02-29 MED ORDER — SODIUM CHLORIDE 0.9 % IV SOLN
375.0000 mg/m2 | Freq: Once | INTRAVENOUS | Status: AC
  Administered 2012-02-29: 600 mg via INTRAVENOUS
  Filled 2012-02-29: qty 60

## 2012-02-29 MED ORDER — DIPHENHYDRAMINE HCL 25 MG PO CAPS
50.0000 mg | ORAL_CAPSULE | Freq: Once | ORAL | Status: AC
  Administered 2012-02-29: 50 mg via ORAL

## 2012-02-29 NOTE — Patient Instructions (Signed)
[facility]cer Center Discharge Instructions for Patients Receiving Chemotherapy  Today you received the following chemotherapy agents Rituxan   To help prevent nausea and vomiting after your treatment, we encourage you to take your nausea medication    If you develop nausea and vomiting that is not controlled by your nausea medication, call the clinic. If it is after clinic hours your family physician or the after hours number for the clinic or go to the Emergency Department.   BELOW ARE SYMPTOMS THAT SHOULD BE REPORTED IMMEDIATELY:  *FEVER GREATER THAN 100.5 F  *CHILLS WITH OR WITHOUT FEVER  NAUSEA AND VOMITING THAT IS NOT CONTROLLED WITH YOUR NAUSEA MEDICATION  *UNUSUAL SHORTNESS OF BREATH  *UNUSUAL BRUISING OR BLEEDING  TENDERNESS IN MOUTH AND THROAT WITH OR WITHOUT PRESENCE OF ULCERS  *URINARY PROBLEMS  *BOWEL PROBLEMS  UNUSUAL RASH Items with * indicate a potential emergency and should be followed up as soon as possible.  One of the nurses will contact you 24 hours after your treatment. Please let the nurse know about any problems that you may have experienced. Feel free to call the clinic you have any questions or concerns. The clinic phone number is [phone].   I have been informed and understand all the instructions given to me. I know to contact the clinic, my physician, or go to the Emergency Department if any problems should occur. I do not have any questions at this time, but understand that I may call the clinic during office hours   should I have any questions or need assistance in obtaining follow up care.    __________________________________________  _____________  __________ Signature of Patient or Authorized Representative            Date                   Time    __________________________________________ Nurse's Signature    

## 2012-02-29 NOTE — Patient Instructions (Addendum)
1. 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lymphoplasmacytic leukemia): Doing well on maintenance Rituxan once every 2 months. 2. Treatment: Continue with the current Rituxan regimen. Last dose is due in March 2014.  3. Followup: In about 2 months.

## 2012-02-29 NOTE — Progress Notes (Signed)
[facility]  Telephone:(336) [phone] Fax:(336) [phone]   OFFICE PROGRESS NOTE    DIAGNOSIS: symptomatic Waldenstrm macroglobulinemia   PAST THERAPY: weekly induction Rituxan x 4 doses started on 06/02/2010.   CURRENT THERAPY: started on maintenance Rituxan q5months x 2 years with first dose on 09/05/10.  INTERVAL HISTORY: [name] 61 y.o. male returns for regular follow up with his wife.  He has returned to work full time since July 2013.  He has good appetite now compared to before starting Rituxan.  He has weight gain. Recently started on paroxetine for irritability which is helping.  Patient denies fever, anorexia, weight loss, fatigue, headache, visual changes, confusion, drenching night sweats, palpable lymph node swelling, mucositis, odynophagia, dysphagia, nausea vomiting, jaundice, chest pain, palpitation, shortness of breath, dyspnea on exertion, productive cough, gum bleeding, epistaxis, hematemesis, hemoptysis, abdominal pain, abdominal swelling, early satiety, melena, hematochezia, hematuria, skin rash, spontaneous bleeding, joint swelling, joint pain, heat or cold intolerance, bowel bladder incontinence, back pain, focal motor weakness, paresthesia, depression, suicidal or homicidal ideation, feeling hopelessness.   Past Medical History  Diagnosis Date  . Anemia     prev bone marrow bx 02/11/10  . GERD (gastroesophageal reflux disease)   . Colitis 2004  . Anxiety   . 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. Sleep apnea 09-12-10    Uses CPAP  . Depression     Past Surgical History  Procedure Date  . Nasal fracture surgery   . Colonoscopy 03/17/2011    Procedure: COLONOSCOPY;  Surgeon: Petra Kuba, MD;  Location: [facility];  Service: Endoscopy;  Laterality: N/A;  . Hip fracture surgery January 2013    R hip    Current Outpatient Prescriptions  Medication Sig Dispense Refill  . PARoxetine (PAXIL) 10 MG tablet Take 10 mg by mouth every  morning.      Marland Kitchen alendronate (FOSAMAX) 70 MG tablet Take 70 mg by mouth Once a week.      Marland Kitchen dexlansoprazole (DEXILANT) 60 MG capsule Take 60 mg by mouth daily.         No current facility-administered medications for this visit.   Facility-Administered Medications Ordered in Other Visits  Medication Dose Route Frequency Provider Last Rate Last Dose  . 0.9 %  sodium chloride infusion   Intravenous Once Exie Parody, MD      . acetaminophen (TYLENOL) tablet 650 mg  650 mg Oral Once Exie Parody, MD      . diphenhydrAMINE (BENADRYL) capsule 50 mg  50 mg Oral Once Exie Parody, MD      . riTUXimab (RITUXAN) 600 mg in sodium chloride 0.9 % 250 mL chemo infusion  375 mg/m2 (Treatment Plan Actual) Intravenous Once Exie Parody, MD        ALLERGIES:   has no known allergies.  REVIEW OF SYSTEMS:  The rest of the 14-point review of system was negative.   Filed Vitals:   02/29/12 0908  BP: 105/61  Pulse: 78  Temp: 97 F (36.1 C)  Resp: 20   Wt Readings from Last 3 Encounters:  02/29/12 137 lb 11.2 oz (62.46 kg)  12/30/11 140 lb (63.504 kg)  12/30/11 140 lb 14.4 oz (63.912 kg)   ECOG Performance status: 0.  PHYSICAL EXAMINATION:   General: thin-appearing man, in no acute distress.  Eyes:  no scleral icterus.  ENT:  There were no oropharyngeal lesions.  Neck was without thyromegaly.  Lymphatics:  Negative cervical, supraclavicular or axillary adenopathy.  Respiratory: lungs were clear bilaterally without wheezing or crackles.  Cardiovascular:  Regular rate and rhythm, S1/S2, without murmur, rub or gallop.  There was no pedal edema.  GI:  abdomen was soft, flat, nontender, nondistended, without organomegaly.  Muscoloskeletal:  no spinal tenderness of palpation of vertebral spine.  Skin exam was without echymosis, petichae.  Neuro exam was nonfocal.  Patient was able to get on and off exam table without assistance.  Gait was normal.  Patient was alerted and oriented.  Attention was good.   Language was  appropriate.  Mood was normal without depression.  Speech was not pressured.  Thought content was not tangential.      LABORATORY/RADIOLOGY DATA:  Lab Results  Component Value Date   WBC 4.6 02/29/2012   HGB 14.1 02/29/2012   HCT 41.3 02/29/2012   PLT 268 02/29/2012   GLUCOSE 94 12/30/2011   ALKPHOS 64 12/30/2011   ALT 9 12/30/2011   AST 13 12/30/2011   NA 143 12/30/2011   K 4.2 12/30/2011   CL 106 12/30/2011   CREATININE 0.9 12/30/2011   BUN 18.0 12/30/2011   CO2 28 12/30/2011   INR 1.22 08/13/2010    ASSESSMENT AND PLAN:   1. Waldenstrom macroglobulinemia with fatigue and B symptoms at presentation. He is doing relatively well on maintenance Rituxan once every 2 months. He has had no side effect of Rituxan. His performance status and disease control are good. His performance status and weight have improved compared to before Rituxan. I recommended to patient to continue with maintenance Rituxan today.  2. Restless leg syndrome: stable.  3. Right femur pain: As expected for recent surgery for fracture. He is slowly recovering.  He has a little limp.  4.  Health maintenance: Flu vaccine administered in October 2013. 5. Depression: Improved with paroxetine. F/U with PCP for this. 6. Follow up: Lab/RV with Rituxan in 2 months

## 2012-04-25 ENCOUNTER — Telehealth: Payer: Self-pay | Admitting: Oncology

## 2012-04-25 ENCOUNTER — Telehealth: Payer: Self-pay | Admitting: *Deleted

## 2012-04-25 ENCOUNTER — Ambulatory Visit (HOSPITAL_BASED_OUTPATIENT_CLINIC_OR_DEPARTMENT_OTHER): Payer: 59

## 2012-04-25 ENCOUNTER — Ambulatory Visit (HOSPITAL_BASED_OUTPATIENT_CLINIC_OR_DEPARTMENT_OTHER): Payer: 59 | Admitting: Oncology

## 2012-04-25 ENCOUNTER — Other Ambulatory Visit (HOSPITAL_BASED_OUTPATIENT_CLINIC_OR_DEPARTMENT_OTHER): Payer: 59 | Admitting: Lab

## 2012-04-25 VITALS — BP 103/68 | HR 79 | Temp 98.8°F | Resp 18

## 2012-04-25 VITALS — BP 114/74 | HR 88 | Temp 97.0°F | Resp 20 | Ht 71.0 in | Wt 139.0 lb

## 2012-04-25 DIAGNOSIS — C8800 Waldenstrom macroglobulinemia not having achieved remission: Secondary | ICD-10-CM

## 2012-04-25 DIAGNOSIS — M899 Disorder of bone, unspecified: Secondary | ICD-10-CM

## 2012-04-25 DIAGNOSIS — Z5112 Encounter for antineoplastic immunotherapy: Secondary | ICD-10-CM

## 2012-04-25 LAB — CBC WITH DIFFERENTIAL/PLATELET
BASO%: 0.8 % (ref 0.0–2.0)
EOS%: 3.3 % (ref 0.0–7.0)
HCT: 38.1 % — ABNORMAL LOW (ref 38.4–49.9)
LYMPH%: 29.7 % (ref 14.0–49.0)
MCH: 30.1 pg (ref 27.2–33.4)
MCHC: 33.9 g/dL (ref 32.0–36.0)
MONO%: 9.3 % (ref 0.0–14.0)
NEUT%: 56.9 % (ref 39.0–75.0)
Platelets: 153 10*3/uL (ref 140–400)
RBC: 4.28 10*6/uL (ref 4.20–5.82)
WBC: 4.9 10*3/uL (ref 4.0–10.3)
nRBC: 0 % (ref 0–0)

## 2012-04-25 MED ORDER — DIPHENHYDRAMINE HCL 25 MG PO CAPS
50.0000 mg | ORAL_CAPSULE | Freq: Once | ORAL | Status: AC
  Administered 2012-04-25: 50 mg via ORAL

## 2012-04-25 MED ORDER — SODIUM CHLORIDE 0.9 % IV SOLN
375.0000 mg/m2 | Freq: Once | INTRAVENOUS | Status: AC
  Administered 2012-04-25: 600 mg via INTRAVENOUS
  Filled 2012-04-25: qty 60

## 2012-04-25 MED ORDER — ACETAMINOPHEN 325 MG PO TABS
650.0000 mg | ORAL_TABLET | Freq: Once | ORAL | Status: AC
  Administered 2012-04-25: 650 mg via ORAL

## 2012-04-25 MED ORDER — SODIUM CHLORIDE 0.9 % IV SOLN
Freq: Once | INTRAVENOUS | Status: AC
  Administered 2012-04-25: 10:00:00 via INTRAVENOUS

## 2012-04-25 NOTE — Telephone Encounter (Signed)
Per staff message and POF I have scheduled appts.  JMW  

## 2012-04-25 NOTE — Telephone Encounter (Signed)
gv and printed appt schedule for pt for march and May,,,,emailed michelle to add tx...the patient awre

## 2012-04-25 NOTE — Patient Instructions (Addendum)
[facility] Discharge Instructions for Patients Receiving Chemotherapy  Today you received the following chemotherapy agents: Rituxan  To help prevent nausea and vomiting after your treatment, we encourage you to take your nausea medication.   Take it as often as prescribed.     If you develop nausea and vomiting that is not controlled by your nausea medication, call the clinic. If it is after clinic hours your family physician or the after hours number for the clinic or go to the Emergency Department.   BELOW ARE SYMPTOMS THAT SHOULD BE REPORTED IMMEDIATELY:  *FEVER GREATER THAN 100.5 F  *CHILLS WITH OR WITHOUT FEVER  NAUSEA AND VOMITING THAT IS NOT CONTROLLED WITH YOUR NAUSEA MEDICATION  *UNUSUAL SHORTNESS OF BREATH  *UNUSUAL BRUISING OR BLEEDING  TENDERNESS IN MOUTH AND THROAT WITH OR WITHOUT PRESENCE OF ULCERS  *URINARY PROBLEMS  *BOWEL PROBLEMS  UNUSUAL RASH Items with * indicate a potential emergency and should be followed up as soon as possible.  Feel free to call the clinic you have any questions or concerns. The clinic phone number is [phone].

## 2012-04-25 NOTE — Progress Notes (Signed)
[facility]  Telephone:(336) [phone] Fax:(336) [phone]   OFFICE PROGRESS NOTE    DIAGNOSIS: symptomatic Waldenstrm macroglobulinemia   PAST THERAPY: weekly induction Rituxan x 4 doses started on 06/02/2010.   CURRENT THERAPY: started on maintenance Rituxan q53months x 2 years with first dose on 09/05/10.  INTERVAL HISTORY: [name] 62 y.o. male returns for regular follow up with his wife.  He reports feeling well.  He does feel tight in the right mid thigh.  He does not have femur pain nor does he takes regular pain med. He denies any frequent fall. With respect to his lymphoma, he reports feeling well. He denies anorexia, weight loss, headache, seizure, recurrent infection, bleeding symptoms, abdominal pain, palpable lymph node swelling. The rest of the 14 point review of system was negative.    Past Medical History  Diagnosis Date  . Anemia     prev bone marrow bx 02/11/10  . GERD (gastroesophageal reflux disease)   . Colitis 2004  . Anxiety   . 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. Sleep apnea 09-12-10    Uses CPAP  . Depression     Past Surgical History  Procedure Date  . Nasal fracture surgery   . Colonoscopy 03/17/2011    Procedure: COLONOSCOPY;  Surgeon: Petra Kuba, MD;  Location: [facility];  Service: Endoscopy;  Laterality: N/A;  . Hip fracture surgery January 2013    R hip    Current Outpatient Prescriptions  Medication Sig Dispense Refill  . dexlansoprazole (DEXILANT) 60 MG capsule Take 60 mg by mouth daily.        Marland Kitchen PARoxetine (PAXIL) 20 MG tablet Take 20 mg by mouth every morning.        ALLERGIES:   has no known allergies.  REVIEW OF SYSTEMS:  The rest of the 14-point review of system was negative.   Filed Vitals:   04/25/12 0831  BP: 114/74  Pulse: 88  Temp: 97 F (36.1 C)  Resp: 20   Wt Readings from Last 3 Encounters:  04/25/12 139 lb (63.05 kg)  02/29/12 137 lb 11.2 oz (62.46 kg)  12/30/11 140 lb (63.504 kg)    ECOG Performance status: 0.  PHYSICAL EXAMINATION:   General: thin-appearing man, in no acute distress.  Eyes:  no scleral icterus.  ENT:  There were no oropharyngeal lesions.  Neck was without thyromegaly.  Lymphatics:  Negative cervical, supraclavicular or axillary adenopathy.  Respiratory: lungs were clear bilaterally without wheezing or crackles.  Cardiovascular:  Regular rate and rhythm, S1/S2, without murmur, rub or gallop.  There was no pedal edema.  GI:  abdomen was soft, flat, nontender, nondistended, without organomegaly.  Muscoloskeletal:  no spinal tenderness of palpation of vertebral spine.  I could not elicit any pain in the right thigh on palpation. Skin exam was without echymosis, petichae.  Neuro exam was nonfocal.  Patient was able to get on and off exam table without assistance.  Gait was normal.  Patient was alerted and oriented.  Attention was good.   Language was appropriate.  Mood was normal without depression.  Speech was not pressured.  Thought content was not tangential.        LABORATORY/RADIOLOGY DATA:  Lab Results  Component Value Date   WBC 4.9 04/25/2012   HGB 12.9* 04/25/2012   HCT 38.1* 04/25/2012   PLT 153 04/25/2012   GLUCOSE 99 02/29/2012   ALKPHOS 64 12/30/2011   ALT 9 12/30/2011   AST 13 12/30/2011  NA 140 02/29/2012   K 3.6 02/29/2012   CL 104 02/29/2012   CREATININE 1.0 02/29/2012   BUN 22.0 02/29/2012   CO2 26 02/29/2012   INR 1.22 08/13/2010    ASSESSMENT AND PLAN:   1. Waldenstrom macroglobulinemia with fatigue and B symptoms at presentation. He has achieved benefit with maintenance Rituxan with no constitutional symptoms. He has no side effects from Rituxan. I recommended him to continue maintenance Rituxan for 2 years total. He expressed informed understanding was to wish to continue with maintenance Rituxan today. 2. Restless leg syndrome: stable.  3. Right femur pain: As expected for recent surgery for fracture. He has some pain in the  spine which could be normal. I advised him to discuss with his PCP the pain persists or worsens significantly. 4. Follow up: Lab/RV with Rituxan in 2 months

## 2012-05-12 ENCOUNTER — Other Ambulatory Visit: Payer: Self-pay | Admitting: *Deleted

## 2012-05-12 DIAGNOSIS — C880 Waldenstrom macroglobulinemia: Secondary | ICD-10-CM

## 2012-06-08 ENCOUNTER — Other Ambulatory Visit: Payer: Self-pay | Admitting: Oncology

## 2012-06-10 ENCOUNTER — Telehealth: Payer: Self-pay | Admitting: *Deleted

## 2012-06-10 ENCOUNTER — Telehealth: Payer: Self-pay | Admitting: Oncology

## 2012-06-10 NOTE — Telephone Encounter (Signed)
Per staff message and POF I have scheduled appts.  JMW  

## 2012-06-10 NOTE — Telephone Encounter (Signed)
returned pt call and pt can not me the 3.14.14 appt and would like to com 3.21.14...emailed Dr. Gaylyn Rong to see how he would [facility] for me to handle

## 2012-06-10 NOTE — Telephone Encounter (Signed)
lvm for pt regarding to Dr. Gaylyn Rong oked the move of tx from 3.14 to 3.21.Marland KitchenMarland KitchenMarland KitchenMove Done

## 2012-06-21 ENCOUNTER — Other Ambulatory Visit: Payer: Self-pay | Admitting: Oncology

## 2012-06-24 ENCOUNTER — Ambulatory Visit: Payer: 59

## 2012-06-24 ENCOUNTER — Other Ambulatory Visit: Payer: 59 | Admitting: Lab

## 2012-06-24 ENCOUNTER — Ambulatory Visit: Payer: 59 | Admitting: Oncology

## 2012-07-01 ENCOUNTER — Other Ambulatory Visit (HOSPITAL_BASED_OUTPATIENT_CLINIC_OR_DEPARTMENT_OTHER): Payer: 59

## 2012-07-01 ENCOUNTER — Ambulatory Visit (HOSPITAL_BASED_OUTPATIENT_CLINIC_OR_DEPARTMENT_OTHER): Payer: 59 | Admitting: Oncology

## 2012-07-01 ENCOUNTER — Telehealth: Payer: Self-pay | Admitting: Oncology

## 2012-07-01 ENCOUNTER — Ambulatory Visit (HOSPITAL_BASED_OUTPATIENT_CLINIC_OR_DEPARTMENT_OTHER): Payer: 59

## 2012-07-01 VITALS — BP 121/73 | HR 66 | Resp 16

## 2012-07-01 VITALS — BP 127/79 | HR 67 | Temp 96.8°F | Resp 18 | Ht 71.0 in | Wt 138.7 lb

## 2012-07-01 DIAGNOSIS — C8800 Waldenstrom macroglobulinemia not having achieved remission: Secondary | ICD-10-CM

## 2012-07-01 DIAGNOSIS — Z5112 Encounter for antineoplastic immunotherapy: Secondary | ICD-10-CM

## 2012-07-01 LAB — CBC WITH DIFFERENTIAL/PLATELET
BASO%: 0.4 % (ref 0.0–2.0)
EOS%: 2 % (ref 0.0–7.0)
HGB: 12.5 g/dL — ABNORMAL LOW (ref 13.0–17.1)
LYMPH%: 26.2 % (ref 14.0–49.0)
MCV: 90.1 fL (ref 79.3–98.0)
NEUT#: 3.2 10*3/uL (ref 1.5–6.5)
NEUT%: 58.2 % (ref 39.0–75.0)
RDW: 14.2 % (ref 11.0–14.6)
lymph#: 1.4 10*3/uL (ref 0.9–3.3)
nRBC: 0 % (ref 0–0)

## 2012-07-01 LAB — COMPREHENSIVE METABOLIC PANEL (CC13)
ALT: 18 U/L (ref 0–55)
AST: 20 U/L (ref 5–34)
Albumin: 3.7 g/dL (ref 3.5–5.0)
Calcium: 8.9 mg/dL (ref 8.4–10.4)
Chloride: 103 mEq/L (ref 98–107)
Potassium: 3.8 mEq/L (ref 3.5–5.1)
Total Protein: 6.8 g/dL (ref 6.4–8.3)

## 2012-07-01 MED ORDER — SODIUM CHLORIDE 0.9 % IV SOLN
375.0000 mg/m2 | Freq: Once | INTRAVENOUS | Status: AC
  Administered 2012-07-01: 600 mg via INTRAVENOUS
  Filled 2012-07-01: qty 60

## 2012-07-01 MED ORDER — SODIUM CHLORIDE 0.9 % IV SOLN
Freq: Once | INTRAVENOUS | Status: AC
  Administered 2012-07-01: 10:00:00 via INTRAVENOUS

## 2012-07-01 MED ORDER — DIPHENHYDRAMINE HCL 25 MG PO CAPS
50.0000 mg | ORAL_CAPSULE | Freq: Once | ORAL | Status: AC
  Administered 2012-07-01: 50 mg via ORAL

## 2012-07-01 MED ORDER — ACETAMINOPHEN 325 MG PO TABS
650.0000 mg | ORAL_TABLET | Freq: Once | ORAL | Status: AC
  Administered 2012-07-01: 650 mg via ORAL

## 2012-07-01 NOTE — Patient Instructions (Addendum)
[facility] Discharge Instructions for Patients Receiving Chemotherapy  Today you received the following chemotherapy agents rituxan  To help prevent nausea and vomiting after your treatment, we encourage you to take your nausea medication  and take it as often as prescribed  If you develop nausea and vomiting that is not controlled by your nausea medication, call the clinic. If it is after clinic hours your family physician or the after hours number for the clinic or go to the Emergency Department.   BELOW ARE SYMPTOMS THAT SHOULD BE REPORTED IMMEDIATELY:  *FEVER GREATER THAN 100.5 F  *CHILLS WITH OR WITHOUT FEVER  NAUSEA AND VOMITING THAT IS NOT CONTROLLED WITH YOUR NAUSEA MEDICATION  *UNUSUAL SHORTNESS OF BREATH  *UNUSUAL BRUISING OR BLEEDING  TENDERNESS IN MOUTH AND THROAT WITH OR WITHOUT PRESENCE OF ULCERS  *URINARY PROBLEMS  *BOWEL PROBLEMS  UNUSUAL RASH Items with * indicate a potential emergency and should be followed up as soon as possible.  One of the nurses will contact you 24 hours after your treatment. Please let the nurse know about any problems that you may have experienced. Feel free to call the clinic you have any questions or concerns. The clinic phone number is [phone].   I have been informed and understand all the instructions given to me. I know to contact the clinic, my physician, or go to the Emergency Department if any problems should occur. I do not have any questions at this time, but understand that I may call the clinic during office hours   should I have any questions or need assistance in obtaining follow up care.    __________________________________________  _____________  __________ Signature of Patient or Authorized Representative            Date                   Time    __________________________________________ Nurse's Signature    

## 2012-07-01 NOTE — Progress Notes (Signed)
[facility]  Telephone:([phone] [phone]   OFFICE PROGRESS NOTE    DIAGNOSIS: symptomatic Waldenstrm macroglobulinemia   PAST THERAPY: weekly induction Rituxan x 4 doses started on 06/02/2010.   CURRENT THERAPY: started on maintenance Rituxan q22months x 2 years with first dose on 09/05/10.  INTERVAL HISTORY: [name] 62 y.o. male returns for regular follow up with his wife.  He denied all symptoms.  He works full time.  He denied fatigue.  He had pneumonia about a month ago and had low appetite and weight loss.  His appetite is now normal.   Patient denies fever, anorexia, weight loss, fatigue, headache, visual changes, confusion, drenching night sweats, palpable lymph node swelling, mucositis, odynophagia, dysphagia, nausea vomiting, jaundice, chest pain, palpitation, shortness of breath, dyspnea on exertion, productive cough, gum bleeding, epistaxis, hematemesis, hemoptysis, abdominal pain, abdominal swelling, early satiety, melena, hematochezia, hematuria, skin rash, spontaneous bleeding, joint swelling, joint pain, heat or cold intolerance, bowel bladder incontinence, back pain, focal motor weakness, paresthesia, depression.     Past Medical History  Diagnosis Date  . Anemia     prev bone marrow bx 02/11/10  . GERD (gastroesophageal reflux disease)   . Colitis 2004  . Anxiety   . 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. Sleep apnea 09-12-10    Uses CPAP  . Depression     Past Surgical History  Procedure Laterality Date  . Nasal fracture surgery    . Colonoscopy  03/17/2011    Procedure: COLONOSCOPY;  Surgeon: Petra Kuba, MD;  Location: [facility];  Service: Endoscopy;  Laterality: N/A;  . Hip fracture surgery  January 2013    R hip    Current Outpatient Prescriptions  Medication Sig Dispense Refill  . dexlansoprazole (DEXILANT) 60 MG capsule Take 60 mg by mouth daily.        Marland Kitchen PARoxetine (PAXIL) 20 MG tablet Take 20 mg by  mouth every morning.       No current facility-administered medications for this visit.    ALLERGIES:  has No Known Allergies.  REVIEW OF SYSTEMS:  The rest of the 14-point review of system was negative.   Filed Vitals:   07/01/12 0830  BP: 127/79  Pulse: 67  Temp: 96.8 F (36 C)  Resp: 18   Wt Readings from Last 3 Encounters:  07/01/12 138 lb 11.2 oz (62.914 kg)  04/25/12 139 lb (63.05 kg)  02/29/12 137 lb 11.2 oz (62.46 kg)   ECOG Performance status: 0.  PHYSICAL EXAMINATION:   General: thin-appearing man, in no acute distress.  Eyes:  no scleral icterus.  ENT:  There were no oropharyngeal lesions.  Neck was without thyromegaly.  Lymphatics:  Negative cervical, supraclavicular or axillary adenopathy.  Respiratory: lungs were clear bilaterally without wheezing or crackles.  Cardiovascular:  Regular rate and rhythm, S1/S2, without murmur, rub or gallop.  There was no pedal edema.  GI:  abdomen was soft, flat, nontender, nondistended, without organomegaly.  Muscoloskeletal:  no spinal tenderness of palpation of vertebral spine.  I could not elicit any pain in the right thigh on palpation. Skin exam was without echymosis, petichae.  Neuro exam was nonfocal.  Patient was able to get on and off exam table without assistance.  Gait was normal.  Patient was alerted and oriented.  Attention was good.   Language was appropriate.  Mood was normal without depression.  Speech was not pressured.  Thought content was not tangential.  LABORATORY/RADIOLOGY DATA:  Lab Results  Component Value Date   WBC 5.5 07/01/2012   HGB 12.5* 07/01/2012   HCT 37.5* 07/01/2012   PLT 316 07/01/2012   GLUCOSE 90 07/01/2012   ALKPHOS 57 07/01/2012   ALT 18 07/01/2012   AST 20 07/01/2012   NA 141 07/01/2012   K 3.8 07/01/2012   CL 103 07/01/2012   CREATININE 0.9 07/01/2012   BUN 15.0 07/01/2012   CO2 29 07/01/2012   INR 1.22 08/13/2010    ASSESSMENT AND PLAN:   1. Waldenstrom macroglobulinemia with  fatigue and B symptoms at presentation.  He is now at the 12th and final dose of Rituxan maintenance therapy. I scheduled a surveillance CT of the chest, abdomen, pelvis and immunoglobulin level and M spike in about 2 months prior to followup. 2. Restless leg syndrome: stable.  3. Follow up: Lab/RV with Rituxan in 2 months

## 2012-07-05 LAB — IMMUNOFIXATION ELECTROPHORESIS: IgM, Serum: 652 mg/dL — ABNORMAL HIGH (ref 41–251)

## 2012-08-26 ENCOUNTER — Ambulatory Visit: Payer: 59

## 2012-08-26 ENCOUNTER — Ambulatory Visit: Payer: 59 | Admitting: Oncology

## 2012-08-26 ENCOUNTER — Other Ambulatory Visit: Payer: 59 | Admitting: Lab

## 2012-08-31 ENCOUNTER — Other Ambulatory Visit (HOSPITAL_BASED_OUTPATIENT_CLINIC_OR_DEPARTMENT_OTHER): Payer: 59 | Admitting: Lab

## 2012-08-31 ENCOUNTER — Ambulatory Visit (HOSPITAL_COMMUNITY)
Admission: RE | Admit: 2012-08-31 | Discharge: 2012-08-31 | Disposition: A | Discharge status: Home / Self Care | Payer: 59 | Source: Ambulatory Visit | Attending: Oncology | Admitting: Oncology

## 2012-08-31 DIAGNOSIS — I700 Atherosclerosis of aorta: Secondary | ICD-10-CM | POA: Insufficient documentation

## 2012-08-31 DIAGNOSIS — C8800 Waldenstrom macroglobulinemia not having achieved remission: Secondary | ICD-10-CM | POA: Insufficient documentation

## 2012-08-31 DIAGNOSIS — K7689 Other specified diseases of liver: Secondary | ICD-10-CM | POA: Insufficient documentation

## 2012-08-31 DIAGNOSIS — Z9221 Personal history of antineoplastic chemotherapy: Secondary | ICD-10-CM | POA: Insufficient documentation

## 2012-08-31 DIAGNOSIS — K5730 Diverticulosis of large intestine without perforation or abscess without bleeding: Secondary | ICD-10-CM | POA: Insufficient documentation

## 2012-08-31 DIAGNOSIS — J984 Other disorders of lung: Secondary | ICD-10-CM | POA: Insufficient documentation

## 2012-08-31 LAB — COMPREHENSIVE METABOLIC PANEL (CC13)
AST: 30 U/L (ref 5–34)
BUN: 21.2 mg/dL (ref 7.0–26.0)
CO2: 28 mEq/L (ref 22–29)
Calcium: 9 mg/dL (ref 8.4–10.4)
Chloride: 104 mEq/L (ref 98–107)
Creatinine: 1 mg/dL (ref 0.7–1.3)
Glucose: 103 mg/dl — ABNORMAL HIGH (ref 70–99)

## 2012-08-31 LAB — CBC WITH DIFFERENTIAL/PLATELET
BASO%: 0.9 % (ref 0.0–2.0)
LYMPH%: 19.6 % (ref 14.0–49.0)
MCHC: 33.7 g/dL (ref 32.0–36.0)
MONO#: 0.5 10*3/uL (ref 0.1–0.9)
Platelets: 275 10*3/uL (ref 140–400)
RBC: 4.51 10*6/uL (ref 4.20–5.82)
WBC: 5.5 10*3/uL (ref 4.0–10.3)
lymph#: 1.1 10*3/uL (ref 0.9–3.3)

## 2012-08-31 MED ORDER — IOHEXOL 300 MG/ML  SOLN
100.0000 mL | Freq: Once | INTRAMUSCULAR | Status: AC | PRN
  Administered 2012-08-31: 100 mL via INTRAVENOUS

## 2012-09-02 LAB — PROTEIN ELECTROPHORESIS, SERUM
Alpha-2-Globulin: 10.2 % (ref 7.1–11.8)
M-Spike, %: 0.83 g/dL
Total Protein, Serum Electrophoresis: 7.3 g/dL (ref 6.0–8.3)

## 2012-09-02 LAB — IGG, IGA, IGM: IgA: 84 mg/dL (ref 68–379)

## 2012-09-02 LAB — KAPPA/LAMBDA LIGHT CHAINS
Kappa free light chain: 1.08 mg/dL (ref 0.33–1.94)
Lambda Free Lght Chn: 1.99 mg/dL (ref 0.57–2.63)

## 2012-09-07 ENCOUNTER — Telehealth: Payer: Self-pay | Admitting: Oncology

## 2012-09-07 ENCOUNTER — Ambulatory Visit (HOSPITAL_BASED_OUTPATIENT_CLINIC_OR_DEPARTMENT_OTHER): Payer: 59 | Admitting: Oncology

## 2012-09-07 ENCOUNTER — Encounter: Payer: Self-pay | Admitting: Oncology

## 2012-09-07 VITALS — BP 108/66 | HR 81 | Temp 98.1°F | Resp 18 | Ht 71.0 in | Wt 138.2 lb

## 2012-09-07 DIAGNOSIS — C880 Waldenstrom macroglobulinemia: Secondary | ICD-10-CM

## 2012-09-07 NOTE — Treatment Plan (Signed)
[facility] END OF TREATMENT   Name: [name] Date: 09/07/2012 MRN: [id number] DOB: 1950/11/07   TREATMENT DATES: 08/2010 to 06/2012   REFERRING PHYSICIAN:  Jerl Mina, M.D.  DIAGNOSIS: waldenstrom's macroglobulinemia.   STAGE AT START OF TREATMENT:   Not applicable   INTENT:Maintenance   DRUGS OR REGIMENS GIVEN:  q2 month Rituxan.   MAJOR TOXICITIES:  none   REASON TREATMENT STOPPED:  Finish of planned therapy.    PERFORMANCE STATUS AT END: ECOG 0   ONGOING PROBLEMS:  none   FOLLOW UP PLANS:  In 4 months

## 2012-09-07 NOTE — Patient Instructions (Addendum)
1.  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  Status:  Finished maintenance Rituxan chemo.  In remission in terms of lymphoma per CT scan.  Still slightly elevated IgM (a type of antibody that is elevated in Waldenstrom's macroglobulinemia) 3.  Recommendation:  Watchful observation.  Return to clinic in about 4 months.

## 2012-09-07 NOTE — Progress Notes (Signed)
[facility]  Telephone:([phone] [phone]   OFFICE PROGRESS NOTE    DIAGNOSIS: symptomatic Waldenstrm macroglobulinemia   PAST THERAPY: weekly induction Rituxan x 4 doses started on 06/02/2010.  started on maintenance Rituxan q58months x 2 years with first dose in 08/2010 and last dose in 06/2012.  CURRENT THERAPY:  Watchful observation.   INTERVAL HISTORY: [name] 62 y.o. male returns for regular follow up with his wife.  He has been having shooting pain in the right thigh.  Extensive work up by PCP and ortho have been negative.  He is working full time.  He denied fever, anorexia, weight loss, fatigue, headache, visual changes, confusion, drenching night sweats, palpable lymph node swelling, mucositis, odynophagia, dysphagia, nausea vomiting, jaundice, chest pain, palpitation, shortness of breath, dyspnea on exertion, productive cough, gum bleeding, epistaxis, hematemesis, hemoptysis, abdominal pain, abdominal swelling, early satiety, melena, hematochezia, hematuria, skin rash, spontaneous bleeding, joint swelling, heat or cold intolerance, bowel bladder incontinence, back pain, focal motor weakness, depression.    Past Medical History  Diagnosis Date  . Anemia     prev bone marrow bx 02/11/10  . GERD (gastroesophageal reflux disease)   . Colitis 2004  . Anxiety   . 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. Sleep apnea 09-12-10    Uses CPAP  . Depression     Past Surgical History  Procedure Laterality Date  . Nasal fracture surgery    . Colonoscopy  03/17/2011    Procedure: COLONOSCOPY;  Surgeon: Petra Kuba, MD;  Location: [facility];  Service: Endoscopy;  Laterality: N/A;  . Hip fracture surgery  January 2013    R hip    Current Outpatient Prescriptions  Medication Sig Dispense Refill  . PARoxetine (PAXIL) 40 MG tablet Take 40 mg by mouth every morning.      Marland Kitchen dexlansoprazole (DEXILANT) 60 MG capsule Take 60 mg by mouth daily.           No current facility-administered medications for this visit.    ALLERGIES:  has No Known Allergies.  REVIEW OF SYSTEMS:  The rest of the 14-point review of system was negative.   Filed Vitals:   09/07/12 0816  BP: 108/66  Pulse: 81  Temp: 98.1 F (36.7 C)  Resp: 18   Wt Readings from Last 3 Encounters:  09/07/12 138 lb 3.2 oz (62.687 kg)  07/01/12 138 lb 11.2 oz (62.914 kg)  04/25/12 139 lb (63.05 kg)   ECOG Performance status: 0.  PHYSICAL EXAMINATION:   General: thin-appearing man, in no acute distress.  Eyes:  no scleral icterus.  ENT:  There were no oropharyngeal lesions.  Neck was without thyromegaly.  Lymphatics:  Negative cervical, supraclavicular or axillary adenopathy.  Respiratory: lungs were clear bilaterally without wheezing or crackles.  Cardiovascular:  Regular rate and rhythm, S1/S2, without murmur, rub or gallop.  There was no pedal edema.  GI:  abdomen was soft, flat, nontender, nondistended, without organomegaly.  Muscoloskeletal:  no spinal tenderness of palpation of vertebral spine.  There was no palpable mass on pain in palpation of right thigh.  Skin exam was without echymosis, petichae.  Neuro exam was nonfocal.  Patient was able to get on and off exam table without assistance.  Gait was normal.  Patient was alerted and oriented.  Attention was good.   Language was appropriate.  Mood was normal without depression.  Speech was not pressured.  Thought content was not tangential.  LABORATORY/RADIOLOGY DATA:  Lab Results  Component Value Date   WBC 5.5 08/31/2012   HGB 13.5 08/31/2012   HCT 40.2 08/31/2012   PLT 275 08/31/2012   GLUCOSE 103* 08/31/2012   ALKPHOS 70 08/31/2012   ALT 24 08/31/2012   AST 30 08/31/2012   NA 142 08/31/2012   K 4.3 08/31/2012   CL 104 08/31/2012   CREATININE 1.0 08/31/2012   BUN 21.2 08/31/2012   CO2 28 08/31/2012   INR 1.22 08/13/2010   RADIOLOGY:  I personally reviewed the following CT scan and showed the images to the  patient and his wife.  CT CHEST  Findings: Biapical pleural parenchymal scarring. Subpleural  reticulation/fibrosis anteriorly in the bilateral upper lobes. No  suspicious pulmonary nodules. Stable thin-walled lung cyst in the  central right lower lobe (series 4/image 32). No pleural effusion  or pneumothorax.  Visualized right thyroid is mildly heterogeneous.  The heart is normal in size. No pericardial effusion.  No suspicious mediastinal, hilar, or axillary lymphadenopathy.  Visualized osseous structures are within normal limits.  IMPRESSION:  No evidence of lymphomatous involvement in the chest.  CT ABDOMEN AND PELVIS  Findings: 9 mm cyst in the medial segment left hepatic lobe  (series 2/image 63). 5 mm cyst in the posterior segment right  hepatic lobe (series 2/image 66).  Spleen is normal in size.  Pancreas and adrenal glands are within normal limits.  Gallbladder is unremarkable. No intrahepatic ductal dilatation.  Common duct measures 7 mm, unchanged.  Kidneys are within normal limits. No hydronephrosis.  No evidence of bowel obstruction. Normal appendix. Colonic  diverticulosis, without associated inflammatory changes.  Atherosclerotic calcifications of the abdominal aorta and branch  vessels.  No suspicious abdominopelvic lymphadenopathy.  Trace pelvic ascites (series 2/image 112).  Prostate is unremarkable.  Bladder is underdistended.  Right total hip arthroplasty with associated streak artifact  overlying the pelvis.  Visualized osseous structures are otherwise within normal limits.  IMPRESSION:  No evidence of lymphomatous involvement in the abdomen/pelvis.  ASSESSMENT AND PLAN:   1. Waldenstrom macroglobulinemia with fatigue and B symptoms at presentation.  2.  Status:  Finished maintenance Rituxan chemo.  In remission in terms of lymphoma per CT scan.  Still slightly elevated IgM (a type of antibody that is elevated in Waldenstrom's macroglobulinemia) 3.   Recommendation:  Watchful observation.  Return to clinic in about 4 months.  4. Restless leg syndrome:  I advised patient on OTC iron supplementation.  If this doesn't work, I advised him to follow up with his PCP for further recommendation.  5.  Bi apical scar:  No clear etiology.  No lung nodules.  If he becomes symptomatic, we will consider repeat CT chest.  6. Follow up: in about 4 months.    I informed Mr. Brayman that I'm leaving the practice.  [facility] for [facility] to see a new provider when he returns.

## 2012-09-07 NOTE — Telephone Encounter (Signed)
Gave pt appt for lab and MD on September 2014 °

## 2013-01-03 ENCOUNTER — Telehealth: Payer: Self-pay | Admitting: *Deleted

## 2013-01-03 NOTE — Telephone Encounter (Signed)
Lm gv lab appt for 01/04/13 @ 8:15am. i also made the pt aware that his MD appt is rs from the 01/09/13 to 01/31/13 @ 1pm. Made pt aware that i will mail a letter/avs as well...td

## 2013-01-04 ENCOUNTER — Other Ambulatory Visit (HOSPITAL_BASED_OUTPATIENT_CLINIC_OR_DEPARTMENT_OTHER): Payer: 59 | Admitting: Lab

## 2013-01-04 DIAGNOSIS — C880 Waldenstrom macroglobulinemia: Secondary | ICD-10-CM

## 2013-01-04 LAB — CBC WITH DIFFERENTIAL/PLATELET
Basophils Absolute: 0 10*3/uL (ref 0.0–0.1)
Eosinophils Absolute: 0.1 10*3/uL (ref 0.0–0.5)
HGB: 13.2 g/dL (ref 13.0–17.1)
MCV: 89.2 fL (ref 79.3–98.0)
MONO#: 0.5 10*3/uL (ref 0.1–0.9)
MONO%: 11.1 % (ref 0.0–14.0)
NEUT#: 2.9 10*3/uL (ref 1.5–6.5)
Platelets: 314 10*3/uL (ref 140–400)
RDW: 14.3 % (ref 11.0–14.6)

## 2013-01-04 LAB — COMPREHENSIVE METABOLIC PANEL (CC13)
ALT: 14 U/L (ref 0–55)
AST: 16 U/L (ref 5–34)
Alkaline Phosphatase: 72 U/L (ref 40–150)
Creatinine: 0.9 mg/dL (ref 0.7–1.3)
Sodium: 144 mEq/L (ref 136–145)
Total Bilirubin: 0.67 mg/dL (ref 0.20–1.20)
Total Protein: 7 g/dL (ref 6.4–8.3)

## 2013-01-04 LAB — IGG, IGA, IGM
IgA: 78 mg/dL (ref 68–379)
IgM, Serum: 793 mg/dL — ABNORMAL HIGH (ref 41–251)

## 2013-01-05 LAB — KAPPA/LAMBDA LIGHT CHAINS
Kappa free light chain: 0.92 mg/dL (ref 0.33–1.94)
Kappa:Lambda Ratio: 0.47 (ref 0.26–1.65)
Lambda Free Lght Chn: 1.97 mg/dL (ref 0.57–2.63)

## 2013-01-09 ENCOUNTER — Ambulatory Visit: Payer: 59

## 2013-01-09 LAB — PROTEIN ELECTROPHORESIS, SERUM
Albumin ELP: 62.3 % (ref 55.8–66.1)
Alpha-1-Globulin: 3.7 % (ref 2.9–4.9)
Beta 2: 12.6 % — ABNORMAL HIGH (ref 3.2–6.5)
Total Protein, Serum Electrophoresis: 6.5 g/dL (ref 6.0–8.3)

## 2013-01-31 ENCOUNTER — Encounter: Payer: Self-pay | Admitting: Hematology and Oncology

## 2013-01-31 ENCOUNTER — Ambulatory Visit (HOSPITAL_BASED_OUTPATIENT_CLINIC_OR_DEPARTMENT_OTHER): Payer: 59 | Admitting: Hematology and Oncology

## 2013-01-31 ENCOUNTER — Telehealth: Payer: Self-pay | Admitting: Hematology and Oncology

## 2013-01-31 VITALS — BP 112/66 | HR 94 | Temp 97.0°F | Resp 20 | Ht 71.0 in | Wt 147.3 lb

## 2013-01-31 DIAGNOSIS — Z23 Encounter for immunization: Secondary | ICD-10-CM

## 2013-01-31 DIAGNOSIS — C880 Waldenstrom macroglobulinemia: Secondary | ICD-10-CM

## 2013-01-31 DIAGNOSIS — M79609 Pain in unspecified limb: Secondary | ICD-10-CM

## 2013-01-31 HISTORY — DX: Encounter for immunization: Z23

## 2013-01-31 MED ORDER — INFLUENZA VAC SPLIT QUAD 0.5 ML IM SUSP
0.5000 mL | INTRAMUSCULAR | Status: AC
  Administered 2013-01-31: 0.5 mL via INTRAMUSCULAR
  Filled 2013-01-31: qty 0.5

## 2013-01-31 NOTE — Progress Notes (Signed)
[facility] OFFICE PROGRESS NOTE  Patient Care Team: Jerl Mina, MD as PCP - General (Family Medicine) Petra Kuba, MD (Gastroenterology)  DIAGNOSIS: DIAGNOSIS: symptomatic Waldenstrm macroglobulinemia   PAST THERAPY: weekly induction Rituxan x 4 doses started on 06/02/2010.  started on maintenance Rituxan q74months x 2 years with first dose in 08/2010 and last dose in 06/2012.  INTERVAL HISTORY: [name] 62 y.o. male returns for further followup. He denies any signs and symptoms to suggest disease progression. Most recently, he injured his right thigh and continued to complain of intermittent pain. The pain was related to accidental injury to the right femur. It's not interfering with his walking. He denies any recent fever, chills, night sweats or abnormal weight loss   he denies any recent infection. I have reviewed the past medical history, past surgical history, social history and family history with the patient and they are unchanged from previous note.  ALLERGIES:  has No Known Allergies.  MEDICATIONS:  Current Outpatient Prescriptions  Medication Sig Dispense Refill  . dexlansoprazole (DEXILANT) 60 MG capsule Take 60 mg by mouth daily.        Marland Kitchen PARoxetine (PAXIL) 40 MG tablet Take 40 mg by mouth every morning.       No current facility-administered medications for this visit.    REVIEW OF SYSTEMS:   Constitutional: Denies fevers, chills or abnormal weight loss Eyes: Denies blurriness of vision Ears, nose, mouth, throat, and face: Denies mucositis or sore throat. He does have a sensation of changes in the Perception of sound on his left ear and Saw ENT Recently. Respiratory: Denies cough, dyspnea or wheezes Cardiovascular: Denies palpitation, chest discomfort or lower extremity swelling Gastrointestinal:  Denies nausea, heartburn or change in bowel habits Skin: Denies abnormal skin rashes Lymphatics: Denies new lymphadenopathy or easy  bruising Neurological:Denies numbness, tingling or new weaknesses Behavioral/Psych: Mood is stable, no new changes  All other systems were reviewed with the patient and are negative.  PHYSICAL EXAMINATION: ECOG PERFORMANCE STATUS: 0 - Asymptomatic  Filed Vitals:   01/31/13 1237  BP: 112/66  Pulse: 94  Temp: 97 F (36.1 C)  Resp: 20   Filed Weights   01/31/13 1237  Weight: 147 lb 4.8 oz (66.815 kg)    GENERAL:alert, no distress and comfortable SKIN: skin color, texture, turgor are normal, no rashes or significant lesions EYES: normal, Conjunctiva are pink and non-injected, sclera clear OROPHARYNX:no exudate, no erythema and lips, buccal mucosa, and tongue normal  NECK: supple, thyroid normal size, non-tender, without nodularity LYMPH:  no palpable lymphadenopathy in the cervical, axillary or inguinal LUNGS: clear to auscultation and percussion with normal breathing effort HEART: regular rate & rhythm and no murmurs and no lower extremity edema ABDOMEN:abdomen soft, non-tender and normal bowel sounds Musculoskeletal:no cyanosis of digits and no clubbing  NEURO: alert & oriented x 3 with fluent speech, no focal motor/sensory deficits  LABORATORY DATA:  I have reviewed the data as listed    Component Value Date/Time   NA 144 01/04/2013 0807   NA 141 10/26/2011 0842   K 4.4 01/04/2013 0807   K 3.9 10/26/2011 0842   CL 104 08/31/2012 0748   CL 102 10/26/2011 0842   CO2 27 01/04/2013 0807   CO2 25 10/26/2011 0842   GLUCOSE 104 01/04/2013 0807   GLUCOSE 103* 08/31/2012 0748   GLUCOSE 86 10/26/2011 0842   BUN 24.3 01/04/2013 0807   BUN 17 10/26/2011 0842   CREATININE 0.9 01/04/2013 7829  CREATININE 0.82 10/26/2011 0842   CALCIUM 9.2 01/04/2013 0807   CALCIUM 10.2 10/26/2011 0842   PROT 7.0 01/04/2013 0807   PROT 8.0 10/26/2011 0842   ALBUMIN 3.8 01/04/2013 0807   ALBUMIN 4.8 10/26/2011 0842   AST 16 01/04/2013 0807   AST 20 10/26/2011 0842   ALT 14 01/04/2013 0807   ALT 17 10/26/2011 0842    ALKPHOS 72 01/04/2013 0807   ALKPHOS 60 10/26/2011 0842   BILITOT 0.67 01/04/2013 0807   BILITOT 1.4* 10/26/2011 0842    No results found for this basename: SPEP,  UPEP,   kappa and lambda light chains    Lab Results  Component Value Date   WBC 4.5 01/04/2013   NEUTROABS 2.9 01/04/2013   HGB 13.2 01/04/2013   HCT 38.5 01/04/2013   MCV 89.2 01/04/2013   PLT 314 01/04/2013      Chemistry      Component Value Date/Time   NA 144 01/04/2013 0807   NA 141 10/26/2011 0842   K 4.4 01/04/2013 0807   K 3.9 10/26/2011 0842   CL 104 08/31/2012 0748   CL 102 10/26/2011 0842   CO2 27 01/04/2013 0807   CO2 25 10/26/2011 0842   BUN 24.3 01/04/2013 0807   BUN 17 10/26/2011 0842   CREATININE 0.9 01/04/2013 0807   CREATININE 0.82 10/26/2011 0842      Component Value Date/Time   CALCIUM 9.2 01/04/2013 0807   CALCIUM 10.2 10/26/2011 0842   ALKPHOS 72 01/04/2013 0807   ALKPHOS 60 10/26/2011 0842   AST 16 01/04/2013 0807   AST 20 10/26/2011 0842   ALT 14 01/04/2013 0807   ALT 17 10/26/2011 0842   BILITOT 0.67 01/04/2013 0807   BILITOT 1.4* 10/26/2011 0842     ASSESSMENT:  History of Waldenstrm macroglobulinemia, no evidence of disease progression  PLAN:  #1 history of Waldenstrm's There is no evidence of disease progression. His most recent blood work done a month ago showed that the IgM level and M spike continue to improve. I recommend watchful observation. I educated the patient's signs and symptoms to watch out for disease progression. I will see him back in 4 months for further history, physical examination, and blood work. #2 leg pain This is related to prior injury and not related to disease progression. I recommend over-the-counter analgesics as needed #3 preventive care I stressed the importance of vitamin D and calcium supplement.  We discussed the importance of preventive care and reviewed the vaccination programs. He does not have any prior allergic reactions to influenza vaccination. He agrees  to proceed with influenza vaccination today and we will administer it today at the clinic.  Orders Placed This Encounter  Procedures  . Immunofixation electrophoresis    Standing Status: Future     Number of Occurrences:      Standing Expiration Date: 01/31/2014  . IgG, IgA, IgM    Standing Status: Future     Number of Occurrences:      Standing Expiration Date: 01/31/2014  . Protein electrophoresis, serum    Standing Status: Future     Number of Occurrences:      Standing Expiration Date: 01/31/2014  . Kappa/lambda light chains    Standing Status: Future     Number of Occurrences:      Standing Expiration Date: 01/31/2014  . CBC with Differential    Standing Status: Future     Number of Occurrences:      Standing Expiration Date: 10/23/2013  .  Comprehensive metabolic panel    Standing Status: Future     Number of Occurrences:      Standing Expiration Date: 01/31/2014  . Lactate dehydrogenase    Standing Status: Future     Number of Occurrences:      Standing Expiration Date: 01/31/2014   All questions were answered. The patient knows to call the clinic with any problems, questions or concerns. No barriers to learning was detected. I spent 15 minutes counseling the patient face to face. The total time spent in the appointment was 20 minutes and more than 50% was on counseling and review of test results     [facility], Harold Baskette, MD 01/31/2013 1:00 PM

## 2013-01-31 NOTE — Telephone Encounter (Signed)
gv and printed appt sched and avs for pt for Feb 2015 °

## 2013-03-04 ENCOUNTER — Emergency Department: Payer: Self-pay | Admitting: Emergency Medicine

## 2013-05-30 ENCOUNTER — Encounter: Payer: Self-pay | Admitting: Hematology and Oncology

## 2013-05-30 ENCOUNTER — Telehealth: Payer: Self-pay | Admitting: Hematology and Oncology

## 2013-05-30 ENCOUNTER — Ambulatory Visit (HOSPITAL_BASED_OUTPATIENT_CLINIC_OR_DEPARTMENT_OTHER): Payer: 59 | Admitting: Hematology and Oncology

## 2013-05-30 ENCOUNTER — Other Ambulatory Visit (HOSPITAL_BASED_OUTPATIENT_CLINIC_OR_DEPARTMENT_OTHER): Payer: 59

## 2013-05-30 VITALS — BP 126/65 | HR 90 | Temp 97.7°F | Resp 18 | Ht 71.0 in | Wt 151.0 lb

## 2013-05-30 DIAGNOSIS — C8800 Waldenstrom macroglobulinemia not having achieved remission: Secondary | ICD-10-CM

## 2013-05-30 DIAGNOSIS — M79609 Pain in unspecified limb: Secondary | ICD-10-CM

## 2013-05-30 LAB — CBC WITH DIFFERENTIAL/PLATELET
BASO%: 0.6 % (ref 0.0–2.0)
BASOS ABS: 0 10*3/uL (ref 0.0–0.1)
EOS%: 1.2 % (ref 0.0–7.0)
Eosinophils Absolute: 0.1 10*3/uL (ref 0.0–0.5)
HCT: 37.4 % — ABNORMAL LOW (ref 38.4–49.9)
HEMOGLOBIN: 12.7 g/dL — AB (ref 13.0–17.1)
LYMPH%: 15.2 % (ref 14.0–49.0)
MCH: 30.2 pg (ref 27.2–33.4)
MCHC: 34 g/dL (ref 32.0–36.0)
MCV: 88.8 fL (ref 79.3–98.0)
MONO#: 0.5 10*3/uL (ref 0.1–0.9)
MONO%: 10.3 % (ref 0.0–14.0)
NEUT#: 3.7 10*3/uL (ref 1.5–6.5)
NEUT%: 72.7 % (ref 39.0–75.0)
Platelets: 281 10*3/uL (ref 140–400)
RBC: 4.21 10*6/uL (ref 4.20–5.82)
RDW: 14.1 % (ref 11.0–14.6)
WBC: 5.1 10*3/uL (ref 4.0–10.3)
lymph#: 0.8 10*3/uL — ABNORMAL LOW (ref 0.9–3.3)

## 2013-05-30 LAB — COMPREHENSIVE METABOLIC PANEL (CC13)
ALBUMIN: 4.2 g/dL (ref 3.5–5.0)
ALK PHOS: 75 U/L (ref 40–150)
ALT: 17 U/L (ref 0–55)
AST: 24 U/L (ref 5–34)
Anion Gap: 10 mEq/L (ref 3–11)
BUN: 20.6 mg/dL (ref 7.0–26.0)
CO2: 24 mEq/L (ref 22–29)
Calcium: 9.4 mg/dL (ref 8.4–10.4)
Chloride: 109 mEq/L (ref 98–109)
Creatinine: 0.8 mg/dL (ref 0.7–1.3)
Glucose: 99 mg/dl (ref 70–140)
POTASSIUM: 3.9 meq/L (ref 3.5–5.1)
Sodium: 143 mEq/L (ref 136–145)
Total Bilirubin: 0.91 mg/dL (ref 0.20–1.20)
Total Protein: 6.9 g/dL (ref 6.4–8.3)

## 2013-05-30 LAB — LACTATE DEHYDROGENASE (CC13): LDH: 174 U/L (ref 125–245)

## 2013-05-30 NOTE — Progress Notes (Signed)
[name]  Patient Care Team: [place] Pink, MD as PCP - General (Family Medicine) Jeryl Columbia, MD (Gastroenterology)  DIAGNOSIS: Waldenstrom macroglobulinemia  SUMMARY OF ONCOLOGIC HISTORY: This patient received prior treatment with weekly induction Rituxan x 4 doses started on 06/02/2010; then he had maintenance Rituxan q15months x 2 years with first dose in 08/2010 and last dose in 06/2012.  INTERVAL HISTORY: [name] 63 y.o. male returns for further followup. His only main complaint is muscle aches and cramps in his thighs and the leg. He was recommended to take calcium and vitamin D. He did not interfere with his walking. He denies any chest pain, headache or dizziness. Denies any new lymphadenopathy. He denies any recent fever, chills, night sweats or abnormal weight loss  I have reviewed the past medical history, past surgical history, social history and family history with the patient and they are unchanged from previous note.  ALLERGIES:  has No Known Allergies.  MEDICATIONS:  Current Outpatient Prescriptions  Medication Sig Dispense Refill  . dexlansoprazole (DEXILANT) 60 MG capsule Take 60 mg by mouth daily.        Marland Kitchen PARoxetine (PAXIL) 40 MG tablet Take 40 mg by mouth every morning.      . Vitamin D, Cholecalciferol, 1000 UNITS TABS Take by mouth.      Marland Kitchen VITAMIN D, ERGOCALCIFEROL, PO Take by mouth daily.       No current facility-administered medications for this visit.    REVIEW OF SYSTEMS:   Constitutional: Denies fevers, chills or abnormal weight loss Eyes: Denies blurriness of vision Ears, nose, mouth, throat, and face: Denies mucositis or sore throat Respiratory: Denies cough, dyspnea or wheezes Cardiovascular: Denies palpitation, chest discomfort or lower extremity swelling Gastrointestinal:  Denies nausea, heartburn or change in bowel habits Skin: Denies abnormal skin rashes Lymphatics: Denies new lymphadenopathy or  easy bruising Neurological:Denies numbness, tingling or new weaknesses Behavioral/Psych: Mood is stable, no new changes  All other systems were reviewed with the patient and are negative.  PHYSICAL EXAMINATION: ECOG PERFORMANCE STATUS: 1 - Symptomatic but completely ambulatory  Filed Vitals:   05/30/13 0817  BP: 126/65  Pulse: 90  Temp: 97.7 F (36.5 C)  Resp: 18   Filed Weights   05/30/13 0817  Weight: 151 lb (68.493 kg)    GENERAL:alert, no distress and comfortable SKIN: skin color, texture, turgor are normal, no rashes or significant lesions EYES: normal, Conjunctiva are pink and non-injected, sclera clear OROPHARYNX:no exudate, no erythema and lips, buccal mucosa, and tongue normal  NECK: supple, thyroid normal size, non-tender, without nodularity LYMPH:  no palpable lymphadenopathy in the cervical, axillary or inguinal LUNGS: clear to auscultation and percussion with normal breathing effort HEART: regular rate & rhythm and no murmurs and no lower extremity edema ABDOMEN:abdomen soft, non-tender and normal bowel sounds Musculoskeletal:no cyanosis of digits and no clubbing  NEURO: alert & oriented x 3 with fluent speech, no focal motor/sensory deficits  LABORATORY DATA:  I have reviewed the data as listed    Component Value Date/Time   NA 144 01/04/2013 0807   NA 141 10/26/2011 0842   K 4.4 01/04/2013 0807   K 3.9 10/26/2011 0842   CL 104 08/31/2012 0748   CL 102 10/26/2011 0842   CO2 27 01/04/2013 0807   CO2 25 10/26/2011 0842   GLUCOSE 104 01/04/2013 0807   GLUCOSE 103* 08/31/2012 0748   GLUCOSE 86 10/26/2011 0842   BUN 24.3 01/04/2013 2440  BUN 17 10/26/2011 0842   CREATININE 0.9 01/04/2013 0807   CREATININE 0.82 10/26/2011 0842   CALCIUM 9.2 01/04/2013 0807   CALCIUM 10.2 10/26/2011 0842   PROT 7.0 01/04/2013 0807   PROT 8.0 10/26/2011 0842   ALBUMIN 3.8 01/04/2013 0807   ALBUMIN 4.8 10/26/2011 0842   AST 16 01/04/2013 0807   AST 20 10/26/2011 0842   ALT 14 01/04/2013 0807    ALT 17 10/26/2011 0842   ALKPHOS 72 01/04/2013 0807   ALKPHOS 60 10/26/2011 0842   BILITOT 0.67 01/04/2013 0807   BILITOT 1.4* 10/26/2011 0842    No results found for this basename: SPEP,  UPEP,   kappa and lambda light chains    Lab Results  Component Value Date   WBC 5.1 05/30/2013   NEUTROABS 3.7 05/30/2013   HGB 12.7* 05/30/2013   HCT 37.4* 05/30/2013   MCV 88.8 05/30/2013   PLT 281 05/30/2013      Chemistry      Component Value Date/Time   NA 144 01/04/2013 0807   NA 141 10/26/2011 0842   K 4.4 01/04/2013 0807   K 3.9 10/26/2011 0842   CL 104 08/31/2012 0748   CL 102 10/26/2011 0842   CO2 27 01/04/2013 0807   CO2 25 10/26/2011 0842   BUN 24.3 01/04/2013 0807   BUN 17 10/26/2011 0842   CREATININE 0.9 01/04/2013 0807   CREATININE 0.82 10/26/2011 0842      Component Value Date/Time   CALCIUM 9.2 01/04/2013 0807   CALCIUM 10.2 10/26/2011 0842   ALKPHOS 72 01/04/2013 0807   ALKPHOS 60 10/26/2011 0842   AST 16 01/04/2013 0807   AST 20 10/26/2011 0842   ALT 14 01/04/2013 0807   ALT 17 10/26/2011 0842   BILITOT 0.67 01/04/2013 0807   BILITOT 1.4* 10/26/2011 0842     ASSESSMENT & PLAN:  #1 history of Waldenstrm's His labs today is still pending. If his blood work remains stable, I plan to see him back in 6 months with repeat history, physical examination, and blood work. #2 leg pain This is related to prior injury and not related to disease progression. I recommend over-the-counter analgesics as needed #3 preventive care I stressed the importance of vitamin D and calcium supplement.   Orders Placed This Encounter  Procedures  . CBC with Differential    Standing Status: Future     Number of Occurrences:      Standing Expiration Date: 05/30/2014  . Comprehensive metabolic panel    Standing Status: Future     Number of Occurrences:      Standing Expiration Date: 05/30/2014  . Lactate dehydrogenase    Standing Status: Future     Number of Occurrences:      Standing Expiration Date:  05/30/2014  . SPEP & IFE with QIG    Standing Status: Future     Number of Occurrences:      Standing Expiration Date: 05/30/2014  . Kappa/lambda light chains    Standing Status: Future     Number of Occurrences:      Standing Expiration Date: 05/30/2014  . Sedimentation rate    Standing Status: Future     Number of Occurrences:      Standing Expiration Date: 05/30/2014   All questions were answered. The patient knows to call the clinic with any problems, questions or concerns. No barriers to learning was detected. I spent 15 minutes counseling the patient face to face. The total time spent in the  appointment was 20 minutes and more than 50% was on counseling and review of test results     [facility], [place], MD 05/30/2013 9:07 AM

## 2013-05-30 NOTE — Telephone Encounter (Signed)
gv pt appt schedule for august

## 2013-06-01 LAB — SPEP & IFE WITH QIG
Albumin ELP: 62 % (ref 55.8–66.1)
Alpha-1-Globulin: 3.7 % (ref 2.9–4.9)
Alpha-2-Globulin: 10 % (ref 7.1–11.8)
Beta 2: 12.8 % — ABNORMAL HIGH (ref 3.2–6.5)
Beta Globulin: 6 % (ref 4.7–7.2)
Gamma Globulin: 5.5 % — ABNORMAL LOW (ref 11.1–18.8)
IGA: 104 mg/dL (ref 68–379)
IGG (IMMUNOGLOBIN G), SERUM: 597 mg/dL — AB (ref 650–1600)
IgM, Serum: 863 mg/dL — ABNORMAL HIGH (ref 41–251)
M-SPIKE, %: 0.57 g/dL
TOTAL PROTEIN, SERUM ELECTROPHOR: 6.6 g/dL (ref 6.0–8.3)

## 2013-06-01 LAB — KAPPA/LAMBDA LIGHT CHAINS
KAPPA FREE LGHT CHN: 1.38 mg/dL (ref 0.33–1.94)
Kappa:Lambda Ratio: 0.65 (ref 0.26–1.65)
LAMBDA FREE LGHT CHN: 2.11 mg/dL (ref 0.57–2.63)

## 2013-09-15 ENCOUNTER — Observation Stay: Payer: Self-pay | Admitting: Specialist

## 2013-09-15 LAB — COMPREHENSIVE METABOLIC PANEL
AST: 26 U/L (ref 15–37)
Albumin: 3.8 g/dL (ref 3.4–5.0)
Alkaline Phosphatase: 91 U/L
Anion Gap: 6 — ABNORMAL LOW (ref 7–16)
BUN: 16 mg/dL (ref 7–18)
Bilirubin,Total: 0.8 mg/dL (ref 0.2–1.0)
CREATININE: 1.03 mg/dL (ref 0.60–1.30)
Calcium, Total: 8.9 mg/dL (ref 8.5–10.1)
Chloride: 108 mmol/L — ABNORMAL HIGH (ref 98–107)
Co2: 26 mmol/L (ref 21–32)
EGFR (Non-African Amer.): >60
Glucose: 103 mg/dL — ABNORMAL HIGH (ref 65–99)
OSMOLALITY: 281 (ref 275–301)
Potassium: 3.6 mmol/L (ref 3.5–5.1)
SGPT (ALT): 20 U/L (ref 12–78)
SODIUM: 140 mmol/L (ref 136–145)
TOTAL PROTEIN: 7.3 g/dL (ref 6.4–8.2)

## 2013-09-15 LAB — TROPONIN I

## 2013-09-15 LAB — CBC WITH DIFFERENTIAL/PLATELET
BASOS PCT: 0.3 %
Basophil #: 0 10*3/uL (ref 0.0–0.1)
EOS ABS: 0 10*3/uL (ref 0.0–0.7)
EOS PCT: 0.3 %
HCT: 40.4 % (ref 40.0–52.0)
HGB: 13.9 g/dL (ref 13.0–18.0)
LYMPHS PCT: 5.8 %
Lymphocyte #: 0.5 10*3/uL — ABNORMAL LOW (ref 1.0–3.6)
MCH: 30.5 pg (ref 26.0–34.0)
MCHC: 34.4 g/dL (ref 32.0–36.0)
MCV: 89 fL (ref 80–100)
MONOS PCT: 9.1 %
Monocyte #: 0.8 x10 3/mm (ref 0.2–1.0)
NEUTROS ABS: 7.6 10*3/uL — AB (ref 1.4–6.5)
NEUTROS PCT: 84.5 %
Platelet: 315 10*3/uL (ref 150–440)
RBC: 4.55 10*6/uL (ref 4.40–5.90)
RDW: 14.1 % (ref 11.5–14.5)
WBC: 8.9 10*3/uL (ref 3.8–10.6)

## 2013-09-16 DIAGNOSIS — C880 Waldenstrom macroglobulinemia: Secondary | ICD-10-CM | POA: Insufficient documentation

## 2013-09-17 DIAGNOSIS — R9389 Abnormal findings on diagnostic imaging of other specified body structures: Secondary | ICD-10-CM | POA: Insufficient documentation

## 2013-09-25 ENCOUNTER — Ambulatory Visit: Payer: Self-pay | Admitting: Oncology

## 2013-09-25 LAB — CBC CANCER CENTER
BASOS ABS: 0 x10 3/mm (ref 0.0–0.1)
BASOS PCT: 0.6 %
Eosinophil #: 0.1 x10 3/mm (ref 0.0–0.7)
Eosinophil %: 0.8 %
HCT: 38.6 % — ABNORMAL LOW (ref 40.0–52.0)
HGB: 13 g/dL (ref 13.0–18.0)
LYMPHS ABS: 1.4 x10 3/mm (ref 1.0–3.6)
LYMPHS PCT: 18.4 %
MCH: 30.2 pg (ref 26.0–34.0)
MCHC: 33.6 g/dL (ref 32.0–36.0)
MCV: 90 fL (ref 80–100)
MONOS PCT: 7.7 %
Monocyte #: 0.6 x10 3/mm (ref 0.2–1.0)
NEUTROS ABS: 5.4 x10 3/mm (ref 1.4–6.5)
NEUTROS PCT: 72.5 %
PLATELETS: 437 x10 3/mm (ref 150–440)
RBC: 4.29 10*6/uL — AB (ref 4.40–5.90)
RDW: 14.2 % (ref 11.5–14.5)
WBC: 7.4 x10 3/mm (ref 3.8–10.6)

## 2013-09-25 LAB — COMPREHENSIVE METABOLIC PANEL
ALBUMIN: 4.2 g/dL (ref 3.4–5.0)
AST: 18 U/L (ref 15–37)
Alkaline Phosphatase: 88 U/L
Anion Gap: 5 — ABNORMAL LOW (ref 7–16)
BUN: 25 mg/dL — ABNORMAL HIGH (ref 7–18)
Bilirubin,Total: 0.7 mg/dL (ref 0.2–1.0)
CALCIUM: 9.1 mg/dL (ref 8.5–10.1)
Chloride: 106 mmol/L (ref 98–107)
Co2: 33 mmol/L — ABNORMAL HIGH (ref 21–32)
Creatinine: 1.12 mg/dL (ref 0.60–1.30)
Glucose: 118 mg/dL — ABNORMAL HIGH (ref 65–99)
OSMOLALITY: 292 (ref 275–301)
POTASSIUM: 4.3 mmol/L (ref 3.5–5.1)
SGPT (ALT): 29 U/L (ref 12–78)
Sodium: 144 mmol/L (ref 136–145)
TOTAL PROTEIN: 7.5 g/dL (ref 6.4–8.2)

## 2013-09-25 LAB — LACTATE DEHYDROGENASE: LDH: 187 U/L (ref 85–241)

## 2013-09-27 LAB — PROT IMMUNOELECTROPHORES(ARMC)

## 2013-09-27 LAB — KAPPA/LAMBDA FREE LIGHT CHAINS (ARMC)

## 2013-10-11 ENCOUNTER — Ambulatory Visit: Payer: Self-pay | Admitting: Oncology

## 2013-10-18 LAB — CBC CANCER CENTER
Basophil #: 0 x10 3/mm (ref 0.0–0.1)
Basophil %: 0.8 %
EOS ABS: 0.1 x10 3/mm (ref 0.0–0.7)
EOS PCT: 2 %
HCT: 39 % — ABNORMAL LOW (ref 40.0–52.0)
HGB: 13 g/dL (ref 13.0–18.0)
LYMPHS ABS: 1.4 x10 3/mm (ref 1.0–3.6)
Lymphocyte %: 23.4 %
MCH: 30.2 pg (ref 26.0–34.0)
MCHC: 33.3 g/dL (ref 32.0–36.0)
MCV: 91 fL (ref 80–100)
Monocyte #: 0.6 x10 3/mm (ref 0.2–1.0)
Monocyte %: 10.3 %
NEUTROS PCT: 63.5 %
Neutrophil #: 3.8 x10 3/mm (ref 1.4–6.5)
Platelet: 255 x10 3/mm (ref 150–440)
RBC: 4.3 10*6/uL — ABNORMAL LOW (ref 4.40–5.90)
RDW: 14.6 % — AB (ref 11.5–14.5)
WBC: 6 x10 3/mm (ref 3.8–10.6)

## 2013-10-25 LAB — CBC CANCER CENTER
BASOS PCT: 0.7 %
Basophil #: 0.1 x10 3/mm (ref 0.0–0.1)
EOS ABS: 0.1 x10 3/mm (ref 0.0–0.7)
EOS PCT: 1.7 %
HCT: 39.6 % — ABNORMAL LOW (ref 40.0–52.0)
HGB: 13.4 g/dL (ref 13.0–18.0)
LYMPHS PCT: 21.6 %
Lymphocyte #: 1.6 x10 3/mm (ref 1.0–3.6)
MCH: 30.3 pg (ref 26.0–34.0)
MCHC: 33.9 g/dL (ref 32.0–36.0)
MCV: 89 fL (ref 80–100)
Monocyte #: 0.7 x10 3/mm (ref 0.2–1.0)
Monocyte %: 8.9 %
NEUTROS PCT: 67.1 %
Neutrophil #: 5.1 x10 3/mm (ref 1.4–6.5)
PLATELETS: 319 x10 3/mm (ref 150–440)
RBC: 4.43 10*6/uL (ref 4.40–5.90)
RDW: 14.7 % — AB (ref 11.5–14.5)
WBC: 7.6 x10 3/mm (ref 3.8–10.6)

## 2013-11-01 LAB — CBC CANCER CENTER
BASOS ABS: 0 x10 3/mm (ref 0.0–0.1)
BASOS PCT: 0.9 %
EOS ABS: 0.1 x10 3/mm (ref 0.0–0.7)
EOS PCT: 1.6 %
HCT: 38.8 % — AB (ref 40.0–52.0)
HGB: 12.9 g/dL — ABNORMAL LOW (ref 13.0–18.0)
LYMPHS PCT: 20.4 %
Lymphocyte #: 1.1 x10 3/mm (ref 1.0–3.6)
MCH: 30 pg (ref 26.0–34.0)
MCHC: 33.3 g/dL (ref 32.0–36.0)
MCV: 90 fL (ref 80–100)
MONO ABS: 0.5 x10 3/mm (ref 0.2–1.0)
Monocyte %: 8.9 %
Neutrophil #: 3.7 x10 3/mm (ref 1.4–6.5)
Neutrophil %: 68.2 %
Platelet: 357 x10 3/mm (ref 150–440)
RBC: 4.31 10*6/uL — ABNORMAL LOW (ref 4.40–5.90)
RDW: 14.4 % (ref 11.5–14.5)
WBC: 5.5 x10 3/mm (ref 3.8–10.6)

## 2013-11-01 LAB — COMPREHENSIVE METABOLIC PANEL
ALK PHOS: 82 U/L
Albumin: 3.7 g/dL (ref 3.4–5.0)
Anion Gap: 10 (ref 7–16)
BILIRUBIN TOTAL: 0.6 mg/dL (ref 0.2–1.0)
BUN: 19 mg/dL — ABNORMAL HIGH (ref 7–18)
CALCIUM: 8.5 mg/dL (ref 8.5–10.1)
CHLORIDE: 103 mmol/L (ref 98–107)
CREATININE: 1.12 mg/dL (ref 0.60–1.30)
Co2: 27 mmol/L (ref 21–32)
EGFR (African American): >60
Glucose: 121 mg/dL — ABNORMAL HIGH (ref 65–99)
OSMOLALITY: 283 (ref 275–301)
POTASSIUM: 3.9 mmol/L (ref 3.5–5.1)
SGOT(AST): 16 U/L (ref 15–37)
SGPT (ALT): 22 U/L
Sodium: 140 mmol/L (ref 136–145)
Total Protein: 7 g/dL (ref 6.4–8.2)

## 2013-11-11 ENCOUNTER — Ambulatory Visit: Payer: Self-pay | Admitting: Oncology

## 2013-11-19 ENCOUNTER — Other Ambulatory Visit: Payer: Self-pay | Admitting: Hematology and Oncology

## 2013-11-21 ENCOUNTER — Other Ambulatory Visit: Payer: 59

## 2013-11-28 ENCOUNTER — Ambulatory Visit: Payer: 59 | Admitting: Hematology and Oncology

## 2013-11-29 LAB — COMPREHENSIVE METABOLIC PANEL
ALT: 23 U/L
ANION GAP: 10 (ref 7–16)
AST: 27 U/L (ref 15–37)
Albumin: 3.7 g/dL (ref 3.4–5.0)
Alkaline Phosphatase: 97 U/L
BUN: 20 mg/dL — AB (ref 7–18)
Bilirubin,Total: 0.4 mg/dL (ref 0.2–1.0)
CREATININE: 1.22 mg/dL (ref 0.60–1.30)
Calcium, Total: 8.4 mg/dL — ABNORMAL LOW (ref 8.5–10.1)
Chloride: 104 mmol/L (ref 98–107)
Co2: 30 mmol/L (ref 21–32)
EGFR (Non-African Amer.): >60
GLUCOSE: 72 mg/dL (ref 65–99)
Osmolality: 288 (ref 275–301)
POTASSIUM: 4.5 mmol/L (ref 3.5–5.1)
Sodium: 144 mmol/L (ref 136–145)
TOTAL PROTEIN: 7 g/dL (ref 6.4–8.2)

## 2013-11-29 LAB — CBC CANCER CENTER
BASOS ABS: 0.1 x10 3/mm (ref 0.0–0.1)
Basophil %: 0.8 %
Eosinophil #: 0.1 x10 3/mm (ref 0.0–0.7)
Eosinophil %: 0.7 %
HCT: 38 % — ABNORMAL LOW (ref 40.0–52.0)
HGB: 12.7 g/dL — ABNORMAL LOW (ref 13.0–18.0)
LYMPHS PCT: 17.1 %
Lymphocyte #: 1.5 x10 3/mm (ref 1.0–3.6)
MCH: 30.2 pg (ref 26.0–34.0)
MCHC: 33.4 g/dL (ref 32.0–36.0)
MCV: 91 fL (ref 80–100)
Monocyte #: 0.8 x10 3/mm (ref 0.2–1.0)
Monocyte %: 9.1 %
Neutrophil #: 6.3 x10 3/mm (ref 1.4–6.5)
Neutrophil %: 72.3 %
PLATELETS: 331 x10 3/mm (ref 150–440)
RBC: 4.2 10*6/uL — ABNORMAL LOW (ref 4.40–5.90)
RDW: 14.2 % (ref 11.5–14.5)
WBC: 8.6 x10 3/mm (ref 3.8–10.6)

## 2013-11-29 LAB — LACTATE DEHYDROGENASE: LDH: 182 U/L (ref 85–241)

## 2013-12-04 LAB — PROT IMMUNOELECTROPHORES(ARMC)

## 2013-12-07 ENCOUNTER — Ambulatory Visit: Payer: Self-pay | Admitting: Oncology

## 2013-12-12 ENCOUNTER — Ambulatory Visit: Payer: Self-pay | Admitting: Oncology

## 2013-12-27 LAB — CBC CANCER CENTER
Basophil #: 0.1 x10 3/mm (ref 0.0–0.1)
Basophil %: 0.8 %
Eosinophil #: 0.1 x10 3/mm (ref 0.0–0.7)
Eosinophil %: 1.6 %
HCT: 38.5 % — AB (ref 40.0–52.0)
HGB: 12.7 g/dL — AB (ref 13.0–18.0)
LYMPHS PCT: 15.7 %
Lymphocyte #: 1.1 x10 3/mm (ref 1.0–3.6)
MCH: 30 pg (ref 26.0–34.0)
MCHC: 32.9 g/dL (ref 32.0–36.0)
MCV: 91 fL (ref 80–100)
Monocyte #: 0.6 x10 3/mm (ref 0.2–1.0)
Monocyte %: 8.2 %
NEUTROS PCT: 73.7 %
Neutrophil #: 5.3 x10 3/mm (ref 1.4–6.5)
Platelet: 310 x10 3/mm (ref 150–440)
RBC: 4.22 10*6/uL — AB (ref 4.40–5.90)
RDW: 14.2 % (ref 11.5–14.5)
WBC: 7.2 x10 3/mm (ref 3.8–10.6)

## 2013-12-27 LAB — COMPREHENSIVE METABOLIC PANEL
ALBUMIN: 3.6 g/dL (ref 3.4–5.0)
ALK PHOS: 94 U/L
AST: 21 U/L (ref 15–37)
Anion Gap: 8 (ref 7–16)
BILIRUBIN TOTAL: 0.5 mg/dL (ref 0.2–1.0)
BUN: 23 mg/dL — AB (ref 7–18)
CO2: 29 mmol/L (ref 21–32)
Calcium, Total: 8.6 mg/dL (ref 8.5–10.1)
Chloride: 105 mmol/L (ref 98–107)
Creatinine: 1.22 mg/dL (ref 0.60–1.30)
Glucose: 121 mg/dL — ABNORMAL HIGH (ref 65–99)
Osmolality: 288 (ref 275–301)
Potassium: 4.7 mmol/L (ref 3.5–5.1)
SGPT (ALT): 23 U/L
Sodium: 142 mmol/L (ref 136–145)
TOTAL PROTEIN: 6.9 g/dL (ref 6.4–8.2)

## 2014-01-11 ENCOUNTER — Ambulatory Visit: Payer: Self-pay | Admitting: Oncology

## 2014-02-19 ENCOUNTER — Ambulatory Visit: Payer: Self-pay | Admitting: Hematology and Oncology

## 2014-02-19 LAB — COMPREHENSIVE METABOLIC PANEL
ALBUMIN: 3.9 g/dL (ref 3.4–5.0)
ANION GAP: 8 (ref 7–16)
Alkaline Phosphatase: 86 U/L
BUN: 31 mg/dL — ABNORMAL HIGH (ref 7–18)
Bilirubin,Total: 1 mg/dL (ref 0.2–1.0)
Calcium, Total: 8.8 mg/dL (ref 8.5–10.1)
Chloride: 104 mmol/L (ref 98–107)
Co2: 30 mmol/L (ref 21–32)
Creatinine: 1.11 mg/dL (ref 0.60–1.30)
EGFR (African American): >60
EGFR (Non-African Amer.): >60
Glucose: 114 mg/dL — ABNORMAL HIGH (ref 65–99)
Osmolality: 291 (ref 275–301)
Potassium: 4.4 mmol/L (ref 3.5–5.1)
SGOT(AST): 19 U/L (ref 15–37)
SGPT (ALT): 19 U/L
SODIUM: 142 mmol/L (ref 136–145)
TOTAL PROTEIN: 7.1 g/dL (ref 6.4–8.2)

## 2014-02-19 LAB — CBC CANCER CENTER
Basophil #: 0.1 x10 3/mm (ref 0.0–0.1)
Basophil %: 1 %
Eosinophil #: 0 x10 3/mm (ref 0.0–0.7)
Eosinophil %: 0.6 %
HCT: 41.5 % (ref 40.0–52.0)
HGB: 13.8 g/dL (ref 13.0–18.0)
Lymphocyte #: 1.5 x10 3/mm (ref 1.0–3.6)
Lymphocyte %: 23.2 %
MCH: 30.1 pg (ref 26.0–34.0)
MCHC: 33.1 g/dL (ref 32.0–36.0)
MCV: 91 fL (ref 80–100)
Monocyte #: 0.6 x10 3/mm (ref 0.2–1.0)
Monocyte %: 9 %
NEUTROS PCT: 66.2 %
Neutrophil #: 4.2 x10 3/mm (ref 1.4–6.5)
Platelet: 287 x10 3/mm (ref 150–440)
RBC: 4.56 10*6/uL (ref 4.40–5.90)
RDW: 13.9 % (ref 11.5–14.5)
WBC: 6.4 x10 3/mm (ref 3.8–10.6)

## 2014-02-19 LAB — LACTATE DEHYDROGENASE: LDH: 153 U/L (ref 85–241)

## 2014-03-02 ENCOUNTER — Inpatient Hospital Stay: Payer: Self-pay | Admitting: Oncology

## 2014-03-02 LAB — CBC CANCER CENTER
BASOS ABS: 0.1 x10 3/mm (ref 0.0–0.1)
Basophil %: 0.7 %
EOS PCT: 0.7 %
Eosinophil #: 0.1 x10 3/mm (ref 0.0–0.7)
HCT: 36.3 % — AB (ref 40.0–52.0)
HGB: 12.2 g/dL — ABNORMAL LOW (ref 13.0–18.0)
LYMPHS PCT: 6.1 %
Lymphocyte #: 0.6 x10 3/mm — ABNORMAL LOW (ref 1.0–3.6)
MCH: 29.8 pg (ref 26.0–34.0)
MCHC: 33.5 g/dL (ref 32.0–36.0)
MCV: 89 fL (ref 80–100)
MONOS PCT: 12.9 %
Monocyte #: 1.4 x10 3/mm — ABNORMAL HIGH (ref 0.2–1.0)
NEUTROS ABS: 8.5 x10 3/mm — AB (ref 1.4–6.5)
Neutrophil %: 79.6 %
Platelet: 299 x10 3/mm (ref 150–440)
RBC: 4.08 10*6/uL — ABNORMAL LOW (ref 4.40–5.90)
RDW: 13.9 % (ref 11.5–14.5)
WBC: 10.7 x10 3/mm — ABNORMAL HIGH (ref 3.8–10.6)

## 2014-03-02 LAB — URINALYSIS, COMPLETE
BLOOD: NEGATIVE
Bacteria: NONE SEEN
Bilirubin,UR: NEGATIVE
GLUCOSE, UR: NEGATIVE mg/dL (ref 0–75)
Ketone: NEGATIVE
LEUKOCYTE ESTERASE: NEGATIVE
NITRITE: NEGATIVE
Ph: 7 (ref 4.5–8.0)
Protein: NEGATIVE
RBC,UR: 7 /HPF (ref 0–5)
Specific Gravity: 1.013 (ref 1.003–1.030)

## 2014-03-02 LAB — COMPREHENSIVE METABOLIC PANEL
ANION GAP: 8 (ref 7–16)
Albumin: 3.1 g/dL — ABNORMAL LOW (ref 3.4–5.0)
Alkaline Phosphatase: 162 U/L — ABNORMAL HIGH
BILIRUBIN TOTAL: 1 mg/dL (ref 0.2–1.0)
BUN: 16 mg/dL (ref 7–18)
CALCIUM: 8.7 mg/dL (ref 8.5–10.1)
Chloride: 102 mmol/L (ref 98–107)
Co2: 31 mmol/L (ref 21–32)
Creatinine: 0.99 mg/dL (ref 0.60–1.30)
Glucose: 100 mg/dL — ABNORMAL HIGH (ref 65–99)
Osmolality: 283 (ref 275–301)
Potassium: 3.5 mmol/L (ref 3.5–5.1)
SGOT(AST): 47 U/L — ABNORMAL HIGH (ref 15–37)
SGPT (ALT): 67 U/L — ABNORMAL HIGH
Sodium: 141 mmol/L (ref 136–145)
Total Protein: 6.9 g/dL (ref 6.4–8.2)

## 2014-03-02 LAB — LACTATE DEHYDROGENASE: LDH: 183 U/L (ref 85–241)

## 2014-03-03 LAB — CREATININE, SERUM
Creatinine: 0.72 mg/dL
EGFR (African American): >60
EGFR (Non-African Amer.): >60

## 2014-03-03 LAB — URINE CULTURE

## 2014-03-05 LAB — CBC WITH DIFFERENTIAL/PLATELET
Basophil #: 0.1 10*3/uL (ref 0.0–0.1)
Basophil %: 0.6 %
Eosinophil #: 0.2 10*3/uL (ref 0.0–0.7)
Eosinophil %: 2 %
HCT: 37.6 % — AB (ref 40.0–52.0)
HGB: 12.6 g/dL — ABNORMAL LOW (ref 13.0–18.0)
Lymphocyte #: 1.1 10*3/uL (ref 1.0–3.6)
Lymphocyte %: 13.1 %
MCH: 30.4 pg (ref 26.0–34.0)
MCHC: 33.6 g/dL (ref 32.0–36.0)
MCV: 90 fL (ref 80–100)
Monocyte #: 0.9 x10 3/mm (ref 0.2–1.0)
Monocyte %: 10.8 %
NEUTROS ABS: 6.3 10*3/uL (ref 1.4–6.5)
Neutrophil %: 73.5 %
PLATELETS: 469 10*3/uL — AB (ref 150–440)
RBC: 4.16 10*6/uL — AB (ref 4.40–5.90)
RDW: 14.6 % — ABNORMAL HIGH (ref 11.5–14.5)
WBC: 8.6 10*3/uL (ref 3.8–10.6)

## 2014-03-05 LAB — CREATININE, SERUM
Creatinine: 0.89 mg/dL (ref 0.60–1.30)
EGFR (African American): >60
EGFR (Non-African Amer.): >60

## 2014-03-05 LAB — POTASSIUM: Potassium: 3.8 mmol/L (ref 3.5–5.1)

## 2014-03-07 LAB — CULTURE, BLOOD (SINGLE)

## 2014-03-12 LAB — COMPREHENSIVE METABOLIC PANEL
ALK PHOS: 126 U/L — AB
ALT: 40 U/L
ANION GAP: 8 (ref 7–16)
Albumin: 3.8 g/dL (ref 3.4–5.0)
BILIRUBIN TOTAL: 0.9 mg/dL (ref 0.2–1.0)
BUN: 24 mg/dL — AB (ref 7–18)
Calcium, Total: 9.3 mg/dL (ref 8.5–10.1)
Chloride: 102 mmol/L (ref 98–107)
Co2: 29 mmol/L (ref 21–32)
Creatinine: 1.03 mg/dL (ref 0.60–1.30)
EGFR (African American): >60
Glucose: 93 mg/dL (ref 65–99)
Osmolality: 281 (ref 275–301)
POTASSIUM: 4.4 mmol/L (ref 3.5–5.1)
SGOT(AST): 24 U/L (ref 15–37)
Sodium: 139 mmol/L (ref 136–145)
TOTAL PROTEIN: 7.5 g/dL (ref 6.4–8.2)

## 2014-03-12 LAB — CBC CANCER CENTER
Basophil #: 0.1 x10 3/mm (ref 0.0–0.1)
Basophil %: 1.3 %
EOS ABS: 0.1 x10 3/mm (ref 0.0–0.7)
Eosinophil %: 1.6 %
HCT: 40 % (ref 40.0–52.0)
HGB: 13.1 g/dL (ref 13.0–18.0)
LYMPHS ABS: 1.1 x10 3/mm (ref 1.0–3.6)
LYMPHS PCT: 23.5 %
MCH: 29.4 pg (ref 26.0–34.0)
MCHC: 32.6 g/dL (ref 32.0–36.0)
MCV: 90 fL (ref 80–100)
MONOS PCT: 13.3 %
Monocyte #: 0.6 x10 3/mm (ref 0.2–1.0)
Neutrophil #: 2.7 x10 3/mm (ref 1.4–6.5)
Neutrophil %: 60.3 %
Platelet: 495 x10 3/mm — ABNORMAL HIGH (ref 150–440)
RBC: 4.44 10*6/uL (ref 4.40–5.90)
RDW: 14.5 % (ref 11.5–14.5)
WBC: 4.5 x10 3/mm (ref 3.8–10.6)

## 2014-03-12 LAB — LACTATE DEHYDROGENASE: LDH: 165 U/L (ref 85–241)

## 2014-03-13 ENCOUNTER — Ambulatory Visit: Payer: Self-pay | Admitting: Oncology

## 2014-03-13 ENCOUNTER — Ambulatory Visit: Payer: Self-pay | Admitting: Hematology and Oncology

## 2014-04-18 ENCOUNTER — Ambulatory Visit: Payer: Self-pay | Admitting: Oncology

## 2014-04-18 LAB — CBC CANCER CENTER
Basophil #: 0 x10 3/mm (ref 0.0–0.1)
Basophil %: 1 %
Eosinophil #: 0.1 x10 3/mm (ref 0.0–0.7)
Eosinophil %: 1.2 %
HCT: 39.9 % — AB (ref 40.0–52.0)
HGB: 13.5 g/dL (ref 13.0–18.0)
LYMPHS ABS: 1.3 x10 3/mm (ref 1.0–3.6)
Lymphocyte %: 27.9 %
MCH: 29.5 pg (ref 26.0–34.0)
MCHC: 33.8 g/dL (ref 32.0–36.0)
MCV: 88 fL (ref 80–100)
MONOS PCT: 12 %
Monocyte #: 0.6 x10 3/mm (ref 0.2–1.0)
NEUTROS ABS: 2.7 x10 3/mm (ref 1.4–6.5)
Neutrophil %: 57.9 %
Platelet: 361 x10 3/mm (ref 150–440)
RBC: 4.56 10*6/uL (ref 4.40–5.90)
RDW: 14.8 % — ABNORMAL HIGH (ref 11.5–14.5)
WBC: 4.7 x10 3/mm (ref 3.8–10.6)

## 2014-04-18 LAB — COMPREHENSIVE METABOLIC PANEL
ALK PHOS: 90 U/L
ALT: 33 U/L
Albumin: 4.2 g/dL (ref 3.4–5.0)
Anion Gap: 10 (ref 7–16)
BUN: 23 mg/dL — ABNORMAL HIGH (ref 7–18)
Bilirubin,Total: 1 mg/dL (ref 0.2–1.0)
CALCIUM: 8.5 mg/dL (ref 8.5–10.1)
Chloride: 104 mmol/L (ref 98–107)
Co2: 26 mmol/L (ref 21–32)
Creatinine: 0.99 mg/dL (ref 0.60–1.30)
EGFR (African American): >60
EGFR (Non-African Amer.): >60
GLUCOSE: 96 mg/dL (ref 65–99)
Osmolality: 283 (ref 275–301)
Potassium: 4.7 mmol/L (ref 3.5–5.1)
SGOT(AST): 27 U/L (ref 15–37)
Sodium: 140 mmol/L (ref 136–145)
Total Protein: 7.3 g/dL (ref 6.4–8.2)

## 2014-05-14 ENCOUNTER — Ambulatory Visit: Payer: Self-pay | Admitting: Oncology

## 2014-06-20 ENCOUNTER — Ambulatory Visit
Admit: 2014-06-20 | Disposition: A | Discharge status: Home / Self Care | Payer: Self-pay | Attending: Oncology | Admitting: Oncology

## 2014-06-20 LAB — CREATININE, SERUM: CREATINE, SERUM: 0.85

## 2014-07-13 ENCOUNTER — Ambulatory Visit
Admit: 2014-07-13 | Disposition: A | Discharge status: Home / Self Care | Payer: Self-pay | Attending: Oncology | Admitting: Oncology

## 2014-07-16 LAB — CREATININE, SERUM: CREATINE, SERUM: 0.85

## 2014-08-04 NOTE — Discharge Summary (Signed)
PATIENT NAME:  [name], [name] MR#:  [id number] DATE OF BIRTH:  July 03, 1950  DATE OF ADMISSION:  09/15/2013 DATE OF TRANSFER:  09/16/2013   For a detailed note, please take a look at the history and physical done on admission by Dr. Bridgett Larsson.   DIAGNOSES: Presently are as follows: Persistent vertigo and dizziness suspected to be secondary to complications of Waldenstrom macroglobulinemia, anxiety/depression, gastroesophageal reflux disease.   [facility] COURSE: Dr. Kallie Edward from oncology.   CURRENT MEDICATIONS: Tylenol 650 q.4 hours as needed, vitamin D3 at 1000 international units daily, meclizine 25 mg t.i.d., Zofran 4 mg q.4 hours as needed, Protonix 40 mg daily, Paxil 40 mg daily.   PERTINENT STUDIES DONE DURING THE HOSPITAL COURSE: Are as follows: A CT scan of the head done without contrast showing mild atrophic changes, no acute abnormality. 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   BRIEF HOSPITAL COURSE: This is a 64 year old male who presented to the hospital on September 15, 2013, secondary to persistent vertigo and dizziness.  1.  Dizziness/vertigo: The exact etiology of this is presently unclear, but suspected to be complications of the underlying Waldenstrom macroglobulinemia. The patient, despite being on Antivert, continues to have persistent symptoms. He has had a neurological work-up including CT head and MRI brain, which has been negative. The case was discussed with the oncologist here on call, Dr. 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  2.  Anxiety/depression: The patient has been maintained on some Paxil. He will continue  that.  3.  GERD: The patient has been maintained on Protonix. He will continue that.   Serum viscosity has been ordered, which is currently pending. 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   TIME SPENT: 50 minutes.   ____________________________ Belia Heman. Verdell Carmine, MD vjs:jcm D: 09/16/2013 14:31:15 ET T: 09/16/2013 15:45:29 ET JOB#: [id number]  cc: Belia Heman. Verdell Carmine, MD, <Dictator> Henreitta Leber MD ELECTRONICALLY SIGNED 09/16/2013 22:00

## 2014-08-04 NOTE — H&P (Signed)
PATIENT NAME:  [name], [name] MR#:  [id number] DATE OF BIRTH:  08-28-1950  DATE OF ADMISSION:  09/15/2013  REFERRING PHYSICIAN: Dr. Kary Kos  REFERRING PHYSICIAN:  Dr. Jasmine December  CHIEF COMPLAINT:  Dizziness for 3 days.  HISTORY OF PRESENT ILLNESS: A 64 year old Caucasian male with a history of Waldenstrom macroglobulinemia, GERD, obstructive sleep apnea presented to the ED with dizziness for 3 days. The patient describes the dizziness as both lightheadedness and room spinning around. The patient also feels weak, has mild headache, blurry vision and nausea but the patient denies any fever or chills. No cough, sputum, shortness of breath. No ear pain or discharge. The patient denies any other symptoms.   PAST MEDICAL HISTORY: As mentioned above. Waldenstrom macroglobulinemia, GERD, obstructive sleep apnea.   PAST SURGICAL HISTORY: Nose surgery secondary to trauma, bone marrow biopsy, colonoscopy.   SOCIAL HISTORY: No smoking or drinking or illicit drugs.   FAMILY HISTORY: Both parents lived up to late 62s with diabetes.  No history of cancer, heart attack or stroke. Brother has a mini stroke.   ALLERGIES: No.   HOME MEDICATIONS: Vitamin D3 1000 international units 1 tablet once a day, paroxetine 40 mg p.o. daily, Dexilant 60 mg p.o. daily, acetaminophen 325 mg 2 tablets p.o. p.r.n.  REVIEW OF SYSTEMS:   CONSTITUTIONAL: The patient denies any fever or chills, but has headache and dizziness and generalized weakness.  EYES: Double vision and blurry vision.  ENT: No postnasal drip, slurred speech or dysphagia.  CARDIOVASCULAR: No chest pain, palpitation, orthopnea, nocturnal dyspnea. No leg edema.  PULMONARY: No cough, sputum, shortness of breath or hemoptysis.  GASTROINTESTINAL: No abdominal pain, nausea, vomiting, diarrhea. No melena or bloody stool. GENITOURINARY:  No dysuria, hematuria or incontinence.  SKIN: No rash or jaundice.  NEUROLOGIC: No syncope, loss of consciousness or  seizure.  ENDOCRINOLOGY: No polyuria, polydipsia, heat or cold intolerance.  HEMATOLOGY: No easy bruising or bleeding.   PHYSICAL EXAMINATION: VITAL SIGNS: Temperature 98, blood pressure 129/77, pulse 90, O2 saturation 100% on room air.  GENERAL: The patient is alert, awake, oriented, in no acute distress.  HEENT: Pupils round, equal and reactive to light and accommodation.  NECK: Supple. No JVD or carotid bruit. No lymphadenopathy. No thyromegaly.  CARDIOVASCULAR: S1, S2 regular rate, rhythm. No murmurs or gallops.  PULMONARY: Bilateral air entry. No wheezing or rales. No use of accessory muscles to breathe.  ABDOMEN: Soft. No distention or tenderness. No organomegaly. Bowel sounds present.  EXTREMITIES: No edema, clubbing or cyanosis. No calf tenderness. Bilateral pedal pulses present.  SKIN: No rash or jaundice.  NEUROLOGIC: AAO x 3. No focal deficit. Power 5/5. Sensory intact.   LABORATORY DATA: CBC in normal range. Troponin less than 0.02, glucose 103, BUN 16, creatinine 1.03. Electrolytes normal.  CAT scan of head: Mild atrophic changes.  No acute abnormality.   IMPRESSIONS: 1.  Vertigo, unclear etiology.  2.  Waldenstrom macroglobulinemia. 3.  Obstructive sleep apnea.  4.  Gastroesophageal reflux disease.  PLAN OF TREATMENT: 1.  The patient will be placed for observation. We will get an MRI which was ordered by Dr. Jasmine December and start Celada.  2.  Give normal saline IV. The patient's wants Dr. Oliva Bustard consult for WM disease.  I requested consult but according to hematologist on-call, the patient can follow up as outpatient.  3.  We will continue the patient's home medications. 4.  Gastrointestinal and deep vein thrombosis prophylaxis. 5.  Discussed the patient's condition and plan of treatment with the patient  and the patient's family.   TIME SPENT: About 53 minutes.    ____________________________ Demetrios Loll, MD qc:ce D: 09/15/2013 14:44:30 ET T: 09/15/2013 15:42:07  ET JOB#: [id number]  cc: Demetrios Loll, MD, <Dictator> Demetrios Loll MD ELECTRONICALLY SIGNED 09/16/2013 16:25

## 2014-08-04 NOTE — Consult Note (Signed)
Brief Consult Note: Diagnosis: Waldenstroms with hyperviscosity syndrome.   Patient was seen by consultant.   Recommend to proceed with surgery or procedure.   Recommend further assessment or treatment.   Discussed with Attending MD.   Comments: Patient having severe symptoms of dissiness with blurred vision for past 5 days. Diagnosed with Waldenstroms several years ago and had had 2 years treatment with Rituxan under DR Lamonte Sakai who is no longer ar [place]. Last ffup about 6 months ago. ENT and Brain MRI negative. Now symptoms consistent with hyperviscosity syndrome. Discussed with Dr Harriet Masson at [facility]- patient will be transferred for urgent plasmapheresis. Hyperviscoisty labs at [facility] pending. Clinical symptoms dictate uregnt intervention with palsmapheresis.  Electronic Signatures: Georges Mouse (MD)  (Signed 06-Jun-15 14:36)  Authored: Brief Consult Note   Last Updated: 06-Jun-15 14:36 by Georges Mouse (MD)

## 2014-08-05 NOTE — Op Note (Signed)
PATIENT NAME:  [name], [name] MR#:  [id number] DATE OF BIRTH:  05/17/1950  DATE OF PROCEDURE:  05/14/2011  PREOPERATIVE DIAGNOSIS: Right femoral neck fracture.   POSTOPERATIVE DIAGNOSIS: Right femoral neck fracture.   PROCEDURE: Right hip hemiarthroplasty.   SURGEON:  Dr. Thornton Park   ANESTHESIA: Spinal.  PROSTHESIS: Stryker Accolade FX stem with 53-mm outer femoral component with a -3 inner head. This is a bipolar prosthesis.   INDICATION FOR THE PROCEDURE: The patient is a 64 year old male who had an injury to the right hip when a trailer he was pulling hit his right leg forcing an external rotation-type injury. He was brought to [facility] after not being able to bear weight on the right lower extremity. His was diagnosed by x-ray with a femoral neck fracture.   PROCEDURE NOTE: The patient was met in the preoperative area. He had his right leg signed within the operative field according to the hospital's right site protocol. He was brought to the operating room where he underwent spinal anesthesia and was then placed in the right lateral decubitus position. All bony prominences were adequately padded. This included an axillary roll under his left side and padding to protect the left common peroneal nerve. The patient was prepped and draped in sterile fashion. A time-out was performed to verify the patient's name, date of birth, medical record number, correct site of surgery, and correct procedure to be performed. It was also used to verify the patient had received antibiotics and that all appropriate instruments, implants, and radiographic studies were available in the room. The patient received 2 grams of Kefzol prior to the onset of the case. When all who were present agreed, the case began.   The patient had bony landmarks identified. The proposed incision was drawn out with a surgical marker based on these bony landmarks. A #10 blade was used to make the  initial incision. The subcutaneous tissues were then dissected with a deep #10  blade. The fascia lata was identified and cleared of overlying soft tissue with use of a Acupuncturist. The fascia lata was then sharply incised revealing underlying bursa of the right hip. The bursa was then dissected using electrocautery. The piriformis was identified as were the external rotators. These were removed from their attachments on the posterior greater trochanter and tagged for later repair. The capsule was then identified. A T-shaped capsulotomy was performed. Both leaflets of the capsule were tagged for later repair. The underlying femoral neck fracture was then easily identified. The femoral head was removed with the help of a straight osteotome. The femoral head was measured to be a size 53. The right hip was then internally rotated.  Proximal femoral osteotomy was performed approximately 1 cm above the lesser trochanter. Cobra retractors were used to establish visualization of the acetabulum. Size 52 and 53 femoral head trials were used and the 53 was found to have the better fit into the acetabulum. Attention was then turned to femoral canal preparation. A box osteotome was used to create the initial entry into the proximal femur. A single hand reamer was then used to establish the femoral canal. Progressive broaches were then used starting with a size zero. These went up in incremental sizes until the best medial and lateral fit was established with a size four. The size four broach was left in place. A planer device was used to remove any excess bone in the proximal femur. Trial components were then established with a 53  outer shell. This was reduced and brought to a full range of motion. It was felt that the neutral inner head was slightly long and a -3 head was then applied to the trial components and the hip was re-reduced. This had excellent stability and the leg lengths were equivalent. Trial components were  then removed. The hip joint was copiously irrigated. The actual size three Stryker HFX stem was then inserted. A 53 bipolar component with a -3 inner head was then applied to the femoral neck component and the hip was reduced. Again, the hip joint was copiously irrigated.   The leaflets of the capsulotomy were then repaired using #2 Ti-Cron. The external rotators as well as the capsule were then repaired to the soft tissues at the greater trochanter. Again, the wound was copiously irrigated. The fascia lata was closed with interrupted 0 Vicryl, the subcutaneous tissue was closed with 2-0 Vicryl and the skin approximated with staples. Dry sterile dressing was applied. The patient was then placed on his back. The leg lengths were equivalent in the operating room. A hip abduction pillow was applied and he was transferred to a hospital bed. He had tolerated the procedure well and was stable in the recovery room. I was scrubbed and present for the entire case and all sharp and instrument counts were correct at the conclusion of the case.        I met with the patient's family to let them know the case had gone without complication and that the patient was stable in the recovery room.     ____________________________ Timoteo Gaul, MD klk:bjt D: 05/14/2011 15:25:34 ET T: 05/15/2011 10:45:26 ET JOB#: [id number]  cc: Timoteo Gaul, MD, <Dictator> Timoteo Gaul MD ELECTRONICALLY SIGNED 05/28/2011 9:48

## 2014-08-05 NOTE — Consult Note (Signed)
Brief Consult Note: Diagnosis: Pre op eval for right hip Fracture, OSA, GERD.   Patient was seen by consultant.   Consult note dictated.   Recommend to proceed with surgery or procedure.   Orders entered.   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 Medical consult for pre op clearance  * Pre op eval- no cardiac history, no DM or renal disease. vitals stable, active at baseline. 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 get notes from Dr. Verneda Skill office, last chemo is 05/08/11, rituixan every 2 months maintanence, f/u blood counts. follows up in [place] with Dr. Lamonte Sakai, oncology  * GERD- protonix  Electronic Signatures: Gladstone Lighter (MD)  (Signed 30-Jan-13 14:03)  Authored: Brief Consult Note   Last Updated: 30-Jan-13 14:03 by Gladstone Lighter (MD)

## 2014-08-05 NOTE — H&P (Signed)
Subjective/Chief Complaint Right hip pain status post fall    History of Present Illness Mr. [name] is a 64 y/o male who is being treated in [place] (Dr. Lamonte Sakai) for lymphoma who presents today with right hip pain after a fall in his yard today.  Patient explains he was pulling a trailer and slipped falling backwards.  The trailer hitch rolled over his right thigh causing an external rotation force.  He was able to get up and stand and make his way into the house after the injury. However, after getting into the house he was unable to walk or stand.  He was driven to the hospital by his wife and had diffiuclty getting into the car.    Past Medical Health Cancer, Lymphoma   Past Med/Surgical Hx:  waldenstroms:   Anxiety:   GERD - Esophageal Reflux:   Bone Cancer:   Denies medical history:   ALLERGIES:  No Known Allergies:   HOME MEDICATIONS:  chemotherapy:   , Active  Dexilant 60 mg oral delayed release capsule: 1 cap(s) orally once a day, Active  Family and Social History:   Place of Living Home   Review of Systems:   Subjective/Chief Complaint Right hip pain    Fever/Chills No    Cough No    Abdominal Pain No    SOB/DOE No    Chest Pain No   Physical Exam:   GEN NAD, thin    HEENT PERRL, hearing intact to voice, moist oral mucosa, Oropharynx clear, good dentition    RESP normal resp effort  clear BS  no use of accessory muscles    CARD regular rate  no murmur  No LE edema  no JVD    GU foley catheter in place  clear yellow urine draining    LYMPH negative neck    EXTR negative cyanosis/clubbing, negative edema, Right lower extremity shortened and externally rotated.  Mild paresthesia present with light  touch of the right foot.  Pedal pulses are palpable.  No thigh swelling.    SKIN normal to palpation, No rashes, No ulcers, No right hip erythema or ecchymosis    NEURO motor/sensory function intact, Mild paresthesias in right foot    PSYCH A+O to time,  place, person, good insight   Routine Hem:  30-Jan-13 10:27    WBC (CBC) 11.9   RBC (CBC) 3.94   Hemoglobin (CBC) 11.7   Hematocrit (CBC) 35.6   Platelet Count (CBC) 240   MCV 90   MCH 29.7   MCHC 32.9   RDW 13.7   Neutrophil % 90.1   Lymphocyte % 5.6   Monocyte % 4.0   Eosinophil % 0.1   Basophil % 0.2   Neutrophil # 10.7   Lymphocyte # 0.7   Monocyte # 0.5   Eosinophil # 0.0   Basophil # 0.0  Routine Chem:  30-Jan-13 10:27    Glucose, Serum 90   BUN 32   Creatinine (comp) 0.83   Sodium, Serum 142   Potassium, Serum 3.8   Chloride, Serum 106   CO2, Serum 27   Calcium (Total), Serum 8.8  Hepatic:  30-Jan-13 10:27    Bilirubin, Total 0.6   Alkaline Phosphatase 72   SGPT (ALT) 23   SGOT (AST) 23   Total Protein, Serum 7.4   Albumin, Serum 3.6  Routine Chem:  30-Jan-13 10:27    Osmolality (calc) 290   eGFR (African American) >60   eGFR (Non-African American) >60  Anion Gap 9  Routine BB:  30-Jan-13 10:27    Antibody Screen NEGATIVE   Crossmatch Unit 1 Ready   Crossmatch Unit 2 Ready  Routine Coag:  30-Jan-13 10:27    Prothrombin 14.0   INR 1.0   Activated PTT (APTT) 26.9  Routine UA:  30-Jan-13 14:24    Color (UA) Yellow   Clarity (UA) Clear   Glucose (UA) Negative   Bilirubin (UA) Negative   Ketones (UA) Trace   Specific Gravity (UA) 1.013   Blood (UA) Negative   pH (UA) 7.0   Protein (UA) Negative   Nitrite (UA) Negative   Leukocyte Esterase (UA) Negative   RBC (UA) <1 /HPF   WBC (UA) 2 /HPF   Mucous (UA) PRESENT   Radiology Results: XRay:    30-Jan-13 10:57, Femur Right   Femur Right   REASON FOR EXAM:    had trailer fall on right leg...  COMMENTS:   LMP: (Male)    PROCEDURE: DXR - DXR FEMUR RIGHT  - May 13 2011 10:57AM     RESULT: History: Trauma.    Findings: Right femoral neck fracture appears to be present. If   confirmation isneeded a CT can be obtained. Mild angulation deformity is   present.    IMPRESSION:  Findings most  consistent right femoral neck fracture.          Verified By: Osa Craver, M.D., MD    30-Jan-13 14:18, Chest Portable Single View   Chest Portable Single View   REASON FOR EXAM:    pre op eval  COMMENTS:       PROCEDURE: DXR - DXR PORTABLE CHEST SINGLE VIEW  - May 13 2011  2:18PM     RESULT: Apical pleural parenchymal thickening is noted. These changes   could be related to scarring. Active disease includinggranulomatous   disease cannot be excluded. No focal alveolar infiltrate is noted. The   cardiovascular structures stable.     IMPRESSION:      1. Biapical pleural and pulmonary pleural thickening most likely related   to scarring. An active process such as granulomas disease cannot be   excluded.  2. Cardiomegaly.   3. No congestive heart failure.      Thank you for the opportunity to contribute to the care of your patient.           Verified By: Osa Craver, M.D., MD  CT:    30-Jan-13 12:25, CT Hip Right Without Contrast   CT Hip Right Without Contrast   REASON FOR EXAM:    Right hip/femur pain trauma DOI 05/13/11  COMMENTS:   LMP: (Male)    PROCEDURE: CT  - CT HIP RIGHT WITHOUT CONTRAST  - May 13 2011 12:25PM     RESULT: Multislice helical acquisition through the right hip is   reconstructed at bone window settings in the axial, coronal and sagittal   planes at 2.0 mm slice thickness. There is no previous similar study for   comparison.    Subcapital slightly rotated fracture is seen in the right hip. The   acetabulum appears intact without evidence of dislocation. The pubic   symphysis and superior and inferior pubic rami are unremarkable.    IMPRESSION:   1. Subcapital right femoral fracture. Slight angulation of the femoral   neck with rotation of the femoral head in the acetabulum.          Verified By: Sundra Aland, M.D., MD  Assessment/Admission Diagnosis Right femoral neck fracture    Plan I reviewed the diagnosis with  the patient and his wife this evening.  I drew a diagram of the injury on his white board and explained the details of the surgical procedure.  I am recommending a hemiarthroplasty for his femoral neck fracture.  The risks and benefits of surgical intervention were discussed in detail with the patient. The patient expressed understanding of the risks and benefits and agreed with plans for surgery.  The risks include, but are not limited to: infection, bleeding requiring transfusion, nerve and blood vessel injury (especially the sciatic nerve leading to foot drop), dislocation, fracture, leg length discrepancy, need for more surgery including conversion to a total hip arthroplasty, DVT, and PE, MI, pneumonia, stroke, respiratory failure and death.  Patient was seen and cleared for surgery by [facility].  I spoke with Dr. Lamonte Sakai on the phone this afternoon and he felt the patient was ok to proceed with surgery.  He did explain that the patient is at slightly higher risk for infection given the patient is receiving a medication which is an antibody to CD20.  I relayed this information to the patient and he understands that he is at increased risk.  I have also reviewed the risks and benefits of blood transfusions and consent for transfusions was also signed.  The patient is NPO after midnight and surgery is scheduled for 1PM tomorrow.  I answered all questions by the patient and his wife.  They understand and agree with the plan.   Electronic Signatures: Thornton Park (MD)  (Signed 30-Jan-13 18:18)  Authored: CHIEF COMPLAINT and HISTORY, PAST MEDICAL/SURGIAL HISTORY, ALLERGIES, HOME MEDICATIONS, FAMILY AND SOCIAL HISTORY, REVIEW OF SYSTEMS, PHYSICAL EXAM, LABS, Radiology, ASSESSMENT AND PLAN   Last Updated: 30-Jan-13 18:18 by Thornton Park (MD)

## 2014-08-05 NOTE — Consult Note (Signed)
PATIENT NAME:  [name], WHANG MR#:  [id number] DATE OF BIRTH:  10/14/1950  DATE OF CONSULTATION:  05/13/2011  REFERRING PHYSICIAN:   CONSULTING PHYSICIAN:  Gladstone Lighter, MD  ADMITTING PHYSICIAN: Dr. Mack Guise   REASON FOR CONSULTATION: Preop evaluation.   BRIEF HISTORY: Mr. Guarino is a pleasant 64 year old Caucasian male with no significant medical problems other than gastroesophageal reflux disease and sleep apnea on CPAP machine, also on Rituxan maintenance treatment for his macroglobulinemia brought to the hospital secondary to fall and right hip pain. Patient has been working in his yard trying to pull a Act[employer] with accidentally he fell down and the portion of the trailer fell on his right thigh. He couldn't bear any weight at home and was in severe pain so was brought to the Emergency Room. In the ER the CT of the right hip showing subcapital right femoral fracture with slight angulation. So he is being admitted to orthopedics for surgery and medicine consult has been requested for preop evaluation.   Patient has not been sick lately. He does not have any high blood pressure, diabetes, renal problems or cardiac disease. No arrhythmia or congestive heart failure. He denies any chest pain on exertion or rest and no evidence of any dyspnea on exertion, pedal edema. He is very active, rides his bike at home and works in the yard.   PAST MEDICAL HISTORY:  1. 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  2. Gastroesophageal reflux disease. 3. Obstructive sleep apnea.    PAST SURGICAL HISTORY:  1. Nose surgery secondary to trauma.  2. Bone marrow biopsy.  3. Colonoscopy.   MEDICATIONS AT HOME:  1. Dexilant 60 mg p.o. daily.  2. CPAP for sleep apnea.   ALLERGIES: No known drug allergies.   FAMILY HISTORY: Both parents lived up to late 43s and had diabetes. No heart disease in the family or cancer.   SOCIAL HISTORY: Lives at home with wife. Quit  smoking in 2008, prior history of 30 pack-year smoking. No alcohol or other drugs.   REVIEW OF SYSTEMS: CONSTITUTIONAL: No fatigue, weakness or weight loss or weight gain. EYES: Uses reading glasses. No glaucoma, inflammation or cataracts. ENT: No tinnitus, ear pain, hearing loss, epistaxis or discharge. RESPIRATORY: No cough, wheeze, hemoptysis, or chronic obstructive pulmonary disease. CARDIOVASCULAR: No chest pain, orthopnea, edema, arrhythmia, palpitations or syncope. GASTROINTESTINAL: No nausea, vomiting, diarrhea, abdominal pain, hematemesis, or melena. GENITOURINARY: No dysuria, hematuria, renal calculus, frequency, or incontinence. ENDOCRINE: No polyuria, nocturia, thyroid problems, heat or cold intolerance. HEMATOLOGY: No anemia, easy bruising. Positive for history of Waldenstrm's macroglobulinemia. SKIN: No acne, rash, or lesions. MUSCULOSKELETAL: Positive for right hip pain after fall. No arthritis or gout. NEUROLOGIC: No numbness, weakness, cerebrovascular accident, transient ischemic attack, or seizures. PSYCHOLOGICAL: No anxiety, insomnia, or depression.   PHYSICAL EXAMINATION:  VITAL SIGNS: Temperature 96.9 degrees Fahrenheit, pulse 101, respirations 20, blood pressure 125/72, pulse ox 99% on room air.   GENERAL: Well built, well nourished male lying in bed, not in any acute distress.   HEENT: Normocephalic, atraumatic. Pupils equal, round, reacting to light. Anicteric sclerae. Extraocular movements intact. Oropharynx clear without erythema, mass or exudates.   NECK: Supple. No thyromegaly, JVD or carotid bruits. No lymphadenopathy.   LUNGS: Clear to auscultation. No wheeze or crackles. No use of accessory muscles for breathing.   CARDIOVASCULAR: S1, S2 regular rate and rhythm. No murmurs, rubs, or gallops.   ABDOMEN: Soft, nontender, nondistended. No hepatosplenomegaly. Normal bowel sounds.   EXTREMITIES: No pedal  edema. No clubbing or cyanosis. 2+ dorsalis pedis pulses palpable  bilaterally. Right hip is extended, externally rotated.  NEUROLOGIC: Cranial nerves intact. No focal motor or sensory deficits.   PSYCHOLOGICAL: Patient is awake, alert, oriented x3.   SKIN: No acne, rash, or lesions.   LABORATORY, DIAGNOSTIC AND RADIOLOGICAL DATA:  WBC 11.9, hemoglobin 11.7, hematocrit 35.6, platelet count 240.   Sodium 142, potassium 3.8, chloride 106, bicarbonate 27, BUN 32, creatinine 0.83, glucose 90, calcium 8.8.   ALT 23, AST 23, alkaline phosphatase 72,k total bilirubin 0.6, albumin 3.6. Blood group B positive. INR 1.0, PTT 26.9. Right femur x-ray findings consistent with right femoral neck fracture. CT of the right hip showing subcapital right femoral fracture with slight angulation of femoral neck and rotation of the femoral head and acetabulum. EKG showing normal sinus rhythm No ST-T wave abnormalities, heart rate of 83.   RECOMMENDATIONS: This is a 64 year old man with history of gastroesophageal reflux disease and Waldenstrm's macroglobulinemia admitted after a fall and right hip fracture. Medical consultation requested for preoperative clearance.  1. Preop evaluation. Patient does not have any risk factors like cardiac history, diabetes or renal disease. He is going to have an intermediate risk procedure. He is a low risk candidate. Can proceed with the surgery at this time with the chest x-ray pending. His EKGs is normal sinus rhythm with no acute ST-T wave abnormalities.  2. Obstructive sleep apnea. Continue with CPAP.  3. Gastroesophageal reflux disease. On Protonix while in the hospital.  4. History of Waldenstrm's macroglobulinemia diagnosed more than a year ago. Follows up with Dr. Lamonte Sakai in [facility] oncology. He has been on Rituxan maintenance. He gets it every two months, last dose of chemotherapy was on 05/08/2011. He has not required any prior blood transfusions or has had any side effects from the Rituxan. Continue to monitor his blood counts while in the  hospital. Will need deep vein thrombosis prophylaxis postoperatively because of his hyperviscosity from macroglobulinemia.  5. Right hip fracture. Management per ortho, likely ORIF tomorrow. Pain medications and PT consult after surgery.   TOTAL TIME SPENT IN CONSULTATION: 50 minutes.   CODE STATUS: FULL CODE.  ____________________________ Gladstone Lighter, MD rk:cms D: 05/13/2011 14:05:16 ET T: 05/13/2011 14:44:54 ET JOB#: [id number]  cc: Gladstone Lighter, MD, <Dictator> Irven Easterly. Kary Kos, MD Timoteo Gaul, MD Dr. Lamonte Sakai, [facility] Oncology  Gladstone Lighter MD ELECTRONICALLY SIGNED 05/19/2011 13:39

## 2014-08-05 NOTE — Discharge Summary (Signed)
PATIENT NAME:  [name], [name] MR#:  [id number] DATE OF BIRTH:  December 07, 1950  DATE OF ADMISSION:  05/13/2011 DATE OF DISCHARGE:  05/22/2011  ADMITTING DIAGNOSIS: Right femoral neck hip fracture.   HISTORY OF PRESENT ILLNESS: Mr. Nawrot is a 64 year old male with a history of lymphoma who presented to the [facility] Emergency Department with right hip pain after a fall in his yard. The patient was pulling a trailer and slipped and fell backwards onto his right hip. The trailer hitch also rolled over his thighs creating an external rotation force of the right hip. Initially the patient was able to stand and walk into the house, but later he was unable to bear weight on the right side which necessitated his visit to the emergency room. The patient was diagnosed with a femoral neck hip fracture by x-ray in the emergency department and admitted to the orthopedic surgery service.   PAST MEDICAL HISTORY:  1. Anxiety.  2. Esophageal reflux. 3. Lymphoma. 4. Waldenstrom's.   HOME MEDICATIONS:  1. Chemotherapy. 2. Dexilant 60 mg daily.   DRUG ALLERGIES: The patient has no known drug allergies.   HOSPITAL COURSE: The patient was admitted to the orthopedic surgery service on 05/13/2011. He was cleared by the medical service preoperatively. I spoke with the patient's oncologist, Dr. Lamonte Sakai, in [place], prior to the operation as well who felt that there was no medical contraindication to his undergoing hip surgery. The patient underwent an uncomplicated right hip hemiarthroplasty, on 05/14/2011. Following surgery he was brought to the orthopedic floor. On postoperative day number one, the patient was doing well. He was up and out of bed to a chair and had no complaints of pain. He was kept on IV antibiotics for the 24 hours postoperative. He was given Kefzol. The patient was followed by the [facility] medical service while an inpatient. He was seen by the physical and occupational consultants  on postoperative day number one and they continued to follow him throughout his hospital stay. The patient had daily labs drawn to ensure a stable hematocrit, white count and platelets as well as monitoring of his electrolytes. On postoperative day number two, the patient again was out of bed to chair and doing well. There were no acute issues. The patient was placed on Lovenox postoperatively and was continued on this medication throughout his hospitalization. On postoperative number three, the patient had a fever of 102 overnight which responded well to incentive spirometry. The patient had negative blood and urine cultures. He continue to have normal motor and sensory function, in the right lower extremity. On the morning of 05/18/2011, the patient was found to have a hematocrit that dropped down to 23 and he was ordered for 2 units of packed red blood cells. He felt tired but denied any chest or shortness of breath at that time. He did have difficulty participating with physical therapy due to fatigue. The patient responded well to the transfusions. Dr. Leia Alf, our oncologist, was consulted to see the patient for symptoms of fatigue in the setting of anemia. He felt that the anemia could be expected from a history of stage IV lymphoma in the setting of recent surgery. He recommended continuing to monitor the CBC. On 05/19/2011, the patient had increased pain in the right lower thigh and knee. This prevented him from participating with physical therapy. He had no pain in the right hip, however. Ultrasound was ordered which was negative for right lower extremity deep vein thrombosis.  He also had x-rays taken of the right and left lower extremities none of which demonstrated any evidence of fracture or other osseous abnormality in the lower extremity. The patient's hip remained located and the prosthesis was in good position. The patient was placed on oxycodone for pain control and given Valium for  potential muscle spasms of the quadriceps muscles, on the right side. The following day, the patient was much improved in regards to his lower extremity pain. He was able to participate again with physical therapy. He continued with physical therapy through to his discharge and had no further acute events until discharge on 05/22/2011. On the day of discharge, the patient was doing well. He still occasionally had intermittent muscle spasms, but his pain was well controlled on oxycodone. Given his clinical improvement and hemodynamic stability, he was prepared for discharge.   DISCHARGE INSTRUCTIONS: The patient will be discharged home with home health and home physical therapy. He cleared PT here at [facility]. He will remain on Lovenox for deep vein thrombosis prophylaxis, 40 mg daily. He was given prescriptions for Lovenox, oxycodone, and Valium prior to discharge. The BNA will check his dressing daily until it is dry. He will continue on regular home physical therapy for gait training, lower extremity strengthening, and hip range of motion. He will follow-up with me in 7 to 10 days for reevaluation. The patient is to remain on posterior hip precautions while at home. He will contact me with any questions or concerns. His hematocrit at discharge was 26. His examination of the right hip on discharge included an incision that was clean, dry, and intact. The patient did not have significant thigh edema. His thigh muscle compartments were soft and compressible. He had intact motor sensory and vascular examination of the bilateral lower extremities.   DISCHARGE DIAGNOSES:  1. Right femoral neck hip fracture status post hemiarthroplasty.  2. Acute postoperative blood loss anemia requiring transfusion of 2 units of packed red blood cells. ____________________________ Timoteo Gaul, MD klk:slb D: 06/04/2011 17:15:08 ET T: 06/04/2011 17:32:34 ET JOB#: [id number]  cc: Timoteo Gaul,  MD, <Dictator> Timoteo Gaul MD ELECTRONICALLY SIGNED 06/05/2011 12:23
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
## 2014-08-17 ENCOUNTER — Other Ambulatory Visit: Payer: Self-pay | Admitting: *Deleted

## 2014-08-17 DIAGNOSIS — C880 Waldenstrom macroglobulinemia: Secondary | ICD-10-CM

## 2014-08-20 ENCOUNTER — Other Ambulatory Visit: Payer: Self-pay | Admitting: Oncology

## 2014-08-20 ENCOUNTER — Inpatient Hospital Stay: Payer: 59 | Attending: Oncology

## 2014-08-20 ENCOUNTER — Inpatient Hospital Stay: Payer: 59

## 2014-08-20 ENCOUNTER — Inpatient Hospital Stay (HOSPITAL_BASED_OUTPATIENT_CLINIC_OR_DEPARTMENT_OTHER): Payer: 59 | Admitting: Oncology

## 2014-08-20 VITALS — BP 126/80 | HR 73 | Temp 95.2°F | Ht 71.5 in | Wt 154.3 lb

## 2014-08-20 VITALS — BP 112/60 | HR 70 | Resp 18

## 2014-08-20 DIAGNOSIS — Z79899 Other long term (current) drug therapy: Secondary | ICD-10-CM | POA: Insufficient documentation

## 2014-08-20 DIAGNOSIS — C880 Waldenstrom macroglobulinemia: Secondary | ICD-10-CM

## 2014-08-20 DIAGNOSIS — R911 Solitary pulmonary nodule: Secondary | ICD-10-CM | POA: Insufficient documentation

## 2014-08-20 DIAGNOSIS — Z87891 Personal history of nicotine dependence: Secondary | ICD-10-CM

## 2014-08-20 DIAGNOSIS — Z5111 Encounter for antineoplastic chemotherapy: Secondary | ICD-10-CM | POA: Insufficient documentation

## 2014-08-20 LAB — CBC WITH DIFFERENTIAL/PLATELET
Basophils Absolute: 0 10*3/uL (ref 0–0.1)
Basophils Relative: 1 %
Eosinophils Absolute: 0.2 10*3/uL (ref 0–0.7)
Eosinophils Relative: 4 %
HCT: 38.3 % — ABNORMAL LOW (ref 40.0–52.0)
Hemoglobin: 13 g/dL (ref 13.0–18.0)
LYMPHS ABS: 0.9 10*3/uL — AB (ref 1.0–3.6)
Lymphocytes Relative: 17 %
MCH: 29.9 pg (ref 26.0–34.0)
MCHC: 33.9 g/dL (ref 32.0–36.0)
MCV: 88.2 fL (ref 80.0–100.0)
Monocytes Absolute: 0.5 10*3/uL (ref 0.2–1.0)
Monocytes Relative: 9 %
Neutro Abs: 3.8 10*3/uL (ref 1.4–6.5)
Neutrophils Relative %: 69 %
PLATELETS: 342 10*3/uL (ref 150–440)
RBC: 4.35 MIL/uL — ABNORMAL LOW (ref 4.40–5.90)
RDW: 14.8 % — ABNORMAL HIGH (ref 11.5–14.5)
WBC: 5.5 10*3/uL (ref 3.8–10.6)

## 2014-08-20 LAB — COMPREHENSIVE METABOLIC PANEL
ALK PHOS: 75 U/L (ref 38–126)
ALT: 16 U/L — AB (ref 17–63)
ANION GAP: 6 (ref 5–15)
AST: 22 U/L (ref 15–41)
Albumin: 4.3 g/dL (ref 3.5–5.0)
BUN: 27 mg/dL — ABNORMAL HIGH (ref 6–20)
CALCIUM: 9.2 mg/dL (ref 8.9–10.3)
CO2: 31 mmol/L (ref 22–32)
Chloride: 104 mmol/L (ref 101–111)
Creatinine, Ser: 0.83 mg/dL (ref 0.61–1.24)
GFR calc Af Amer: >60 mL/min (ref >60)
GFR calc non Af Amer: >60 mL/min (ref >60)
Glucose, Bld: 71 mg/dL (ref 65–99)
POTASSIUM: 4.3 mmol/L (ref 3.5–5.1)
SODIUM: 141 mmol/L (ref 135–145)
TOTAL PROTEIN: 7.4 g/dL (ref 6.5–8.1)
Total Bilirubin: 0.8 mg/dL (ref 0.3–1.2)

## 2014-08-20 LAB — MAGNESIUM: Magnesium: 2.1 mg/dL (ref 1.7–2.4)

## 2014-08-20 MED ORDER — SODIUM CHLORIDE 0.9 % IV SOLN
375.0000 mg/m2 | Freq: Once | INTRAVENOUS | Status: AC
  Administered 2014-08-20: 700 mg via INTRAVENOUS
  Filled 2014-08-20: qty 50

## 2014-08-20 MED ORDER — SODIUM CHLORIDE 0.9 % IV SOLN
Freq: Once | INTRAVENOUS | Status: AC
  Administered 2014-08-20: 12:00:00 via INTRAVENOUS
  Filled 2014-08-20: qty 250

## 2014-08-20 MED ORDER — DIPHENHYDRAMINE HCL 25 MG PO CAPS
50.0000 mg | ORAL_CAPSULE | Freq: Once | ORAL | Status: AC
  Administered 2014-08-20: 50 mg via ORAL
  Filled 2014-08-20: qty 2

## 2014-08-20 MED ORDER — ACETAMINOPHEN 325 MG PO TABS
650.0000 mg | ORAL_TABLET | Freq: Once | ORAL | Status: AC
  Administered 2014-08-20: 650 mg via ORAL
  Filled 2014-08-20: qty 2

## 2014-08-20 MED ORDER — SODIUM CHLORIDE 0.9 % IV SOLN: 375.0000 mg/m2 | Freq: Once | INTRAVENOUS | Status: DC

## 2014-08-21 ENCOUNTER — Encounter: Payer: Self-pay | Admitting: Oncology

## 2014-08-21 LAB — PROTEIN ELECTROPHORESIS, SERUM
A/G RATIO SPE: 1.3 (ref 0.7–2.0)
ALPHA-1-GLOBULIN: 0.2 g/dL (ref 0.1–0.4)
Albumin ELP: 3.7 g/dL (ref 3.2–5.6)
Alpha-2-Globulin: 0.8 g/dL (ref 0.4–1.2)
BETA GLOBULIN: 0.9 g/dL (ref 0.6–1.3)
GAMMA GLOBULIN: 1 g/dL (ref 0.5–1.6)
Globulin, Total: 2.9 g/dL (ref 2.0–4.5)
M-Spike, %: 0.5 g/dL — ABNORMAL HIGH
Total Protein ELP: 6.6 g/dL (ref 6.0–8.5)

## 2014-08-21 NOTE — Progress Notes (Signed)
[place] @ [facility] Telephone:(336) [phone]  Fax:(336) [phone]     [name] OB: 03-07-1951  MR#: [id number]  OVF#:[id number]  Patient Care Team: Maryland Pink, MD as PCP - General (Family Medicine) Clarene Essex, MD (Gastroenterology)  CHIEF COMPLAINT:  Chief Complaint  Patient presents with  . Follow-up   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1,Waldenstrm's macroglobulinemi diagnosis in February of 2012 treated with induction  rituximab therapy  weekly x4 followed by a maintenancerituximab every 2 months   for 2 years , last dose was in March of 2014. 2,n June of 2015 patient was admitted in Hospital with dizziness and vertigo.  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  Patient was referred to [facility] for plasmapheresis and had undergone one plasmapheresis with significant improvement in symptoms.  Patient is here for further followup and treatment consideration. 3.started on rituximab 375 mg per meter squared weekly x4  Finished in July of 2015 4.pulmonary nodule was PET negative (November, 2015) Patient was started on  ibrutanib  but with poor tolerance so discontinued in November of 2015 5.patient was started on maintenance Rituxan therapy, April 18, 2014  No history exists.    Oncology Flowsheet 08/28/2011 10/26/2011 12/30/2011 02/29/2012 04/25/2012 07/01/2012 08/20/2014  diphenhydrAMINE (BENADRYL) PO 50 mg 50 mg 50 mg 50 mg 50 mg 50 mg 50 mg  ondansetron (ZOFRAN) IJ - - - - - - -  riTUXimab (RITUXAN) IV 375 mg/m2 375 mg/m2 375 mg/m2 375 mg/m2 375 mg/m2 375 mg/m2 375 mg/m2    INTERVAL HISTORY: 64 year old gentleman is here for further evaluation and maintenance chemotherapy with Rituxan.  Tolerating treatment very well.  Dizziness has improved.  Patient is now retired.  Working outside.  Has intermittent swelling of lower extremity.  No sinus infection no chills or fever  REVIEW OF SYSTEMS:   GENERAL:   Feels good.  Active.  No fevers, sweats or weight loss. PERFORMANCE STATUS (ECOG):  01 HEENT:  No visual changes, runny nose, sore throat, mouth sores or tenderness. Lungs: No shortness of breath or cough.  No hemoptysis. Cardiac:  No chest pain, palpitations, orthopnea, or PND. GI:  No nausea, vomiting, diarrhea, constipation, melena or hematochezia. GU:  No urgency, frequency, dysuria, or hematuria. Musculoskeletal:  No back pain.  No joint pain.  No muscle tenderness. Extremities:  No pain or swelling. Skin:  No rashes or skin changes. Neuro:  No headache, numbness or weakness, balance or coordination issues. Endocrine:  No diabetes, thyroid issues, hot flashes or night sweats. Psych:  No mood changes, depression or anxiety. Pain:  No focal pain. Review of systems:  All other systems reviewed and found to be negative.  As per HPI. Otherwise, a complete review of systems is negatve.  PAST MEDICAL HISTORY: Past Medical History  Diagnosis Date  . Anemia     prev bone marrow bx 02/11/10  . GERD (gastroesophageal reflux disease)   . Colitis 2004  . Anxiety   . 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. Sleep apnea 09-12-10    Uses CPAP  . Depression   . Need for prophylactic vaccination and inoculation against influenza 01/31/2013  . Bone cancer     PAST SURGICAL HISTORY: Past Surgical History  Procedure Laterality Date  . Nasal fracture surgery    . Colonoscopy  03/17/2011    Procedure: COLONOSCOPY;  Surgeon: Jeryl Columbia, MD;  Location: [facility];  Service: Endoscopy;  Laterality: N/A;  . Hip fracture surgery  January 2013    R hip    FAMILY HISTORy No significant family history     ADVANCED DIRECTIVES:   Advance Directives (For Healthcare)          Patient does have  will                                            HEALTH MAINTENANCE: History  Substance Use Topics  . Smoking status: Former Smoker    Quit date: 04/14/2007  . Smokeless tobacco: Never  Used  . Alcohol Use: No     Colonoscopy:  PAP:  Bone density:  Lipid panel:  Allergies  Allergen Reactions  . Ibrutinib Nausea Only    Current Outpatient Prescriptions  Medication Sig Dispense Refill  . dexlansoprazole (DEXILANT) 60 MG capsule Take 60 mg by mouth daily.      Marland Kitchen PARoxetine (PAXIL) 40 MG tablet Take 40 mg by mouth every morning.    . Vitamin D, Cholecalciferol, 1000 UNITS TABS Take by mouth.    Marland Kitchen VITAMIN D, ERGOCALCIFEROL, PO Take by mouth daily.     No current facility-administered medications for this visit.    OBJECTIVE:  Filed Vitals:   08/20/14 1024  BP: 126/80  Pulse: 73  Temp: 95.2 F (35.1 C)     Body mass index is 21.23 kg/(m^2).    ECOG FS:1 - Symptomatic but completely ambulatory  PHYSICAL EXAM: GENERAL:  Well developed, well nourished, sitting comfortably in the exam room in no acute distress. MENTAL STATUS:  Alert and oriented to person, place and time. HEAD:  Normocephalic, atraumatic, face symmetric, no Cushingoid features. EYES:  eyes.  Pupils equal round and reactive to light and accomodation.  No conjunctivitis or scleral icterus. ENT:  Oropharynx clear without lesion.  Tongue normal. Mucous membranes moist.  RESPIRATORY:  Clear to auscultation without rales, wheezes or rhonchi. CARDIOVASCULAR:  Regular rate and rhythm without murmur, rub or gallop.  ABDOMEN:  Soft, non-tender, with active bowel sounds, and no hepatosplenomegaly.  No masses. BACK:  No CVA tenderness.  No tenderness on percussion of the back or rib cage. SKIN:  No rashes, ulcers or lesions. EXTREMITIES: No edema, no skin discoloration or tenderness.  No palpable cords. LYMPH NODES: No palpable cervical, supraclavicular, axillary or inguinal adenopathy  NEUROLOGICAL: Unremarkable. PSYCH:  Appropriate.   LAB RESULTS:  Appointment on 08/20/2014  Component Date Value Ref Range Status  . WBC 08/20/2014 5.5  3.8 - 10.6 K/uL Final  . RBC 08/20/2014 4.35* 4.40 - 5.90  MIL/uL Final  . Hemoglobin 08/20/2014 13.0  13.0 - 18.0 g/dL Final  . HCT 08/20/2014 38.3* 40.0 - 52.0 % Final  . MCV 08/20/2014 88.2  80.0 - 100.0 fL Final  . MCH 08/20/2014 29.9  26.0 - 34.0 pg Final  . MCHC 08/20/2014 33.9  32.0 - 36.0 g/dL Final  . RDW 08/20/2014 14.8* 11.5 - 14.5 % Final  . Platelets 08/20/2014 342  150 - 440 K/uL Final  . Neutrophils Relative % 08/20/2014 69   Final  . Neutro Abs 08/20/2014 3.8  1.4 - 6.5 K/uL Final  . Lymphocytes Relative 08/20/2014 17   Final  . Lymphs Abs 08/20/2014 0.9* 1.0 - 3.6 K/uL Final  . Monocytes Relative 08/20/2014 9   Final  . Monocytes Absolute 08/20/2014 0.5  0.2 - 1.0 K/uL Final  . Eosinophils Relative 08/20/2014 4  Final  . Eosinophils Absolute 08/20/2014 0.2  0 - 0.7 K/uL Final  . Basophils Relative 08/20/2014 1   Final  . Basophils Absolute 08/20/2014 0.0  0 - 0.1 K/uL Final  . Sodium 08/20/2014 141  135 - 145 mmol/L Final  . Potassium 08/20/2014 4.3  3.5 - 5.1 mmol/L Final  . Chloride 08/20/2014 104  101 - 111 mmol/L Final  . CO2 08/20/2014 31  22 - 32 mmol/L Final  . Glucose, Bld 08/20/2014 71  65 - 99 mg/dL Final  . BUN 08/20/2014 27* 6 - 20 mg/dL Final  . Creatinine, Ser 08/20/2014 0.83  0.61 - 1.24 mg/dL Final  . Calcium 08/20/2014 9.2  8.9 - 10.3 mg/dL Final  . Total Protein 08/20/2014 7.4  6.5 - 8.1 g/dL Final  . Albumin 08/20/2014 4.3  3.5 - 5.0 g/dL Final  . AST 08/20/2014 22  15 - 41 U/L Final  . ALT 08/20/2014 16* 17 - 63 U/L Final  . Alkaline Phosphatase 08/20/2014 75  38 - 126 U/L Final  . Total Bilirubin 08/20/2014 0.8  0.3 - 1.2 mg/dL Final  . GFR calc non Af Amer 08/20/2014 >60  >60 mL/min Final  . GFR calc Af Amer 08/20/2014 >60  >60 mL/min Final   Comment: (NOTE) The eGFR has been calculated using the CKD EPI equation. This calculation has not been validated in all clinical situations. eGFR's persistently <60 mL/min signify possible Chronic Kidney Disease.   . Anion gap 08/20/2014 6  5 - 15 Final  .  Magnesium 08/20/2014 2.1  1.7 - 2.4 mg/dL Final     Impression Waldenstrm's macroglobulinemia in remission Maintenance therapy with rituximab Lung nodule which is being followed regularly by CT scan.  Stable Patient has quit smoking MEDICAL DECISION MAKING:  Continue rituximab maintenance treatment.  No significant side effect.  SIEP and light chain pending.  Patient expressed understanding and was in agreement with this plan. He also understands that He can call clinic at any time with any questions, concerns, or complaints.    No matching staging information was found for the patient.  Forest Gleason, MD   08/21/2014 8:10 AM

## 2014-10-01 ENCOUNTER — Telehealth: Payer: Self-pay | Admitting: *Deleted

## 2014-10-01 NOTE — Telephone Encounter (Signed)
Appt agreed on for 6/21 at 9:45 by wife

## 2014-10-02 ENCOUNTER — Inpatient Hospital Stay: Payer: 59 | Attending: Oncology | Admitting: Oncology

## 2014-10-02 ENCOUNTER — Inpatient Hospital Stay: Payer: 59

## 2014-10-02 VITALS — BP 125/78 | HR 78 | Temp 96.2°F | Wt 152.8 lb

## 2014-10-02 DIAGNOSIS — D649 Anemia, unspecified: Secondary | ICD-10-CM | POA: Diagnosis not present

## 2014-10-02 DIAGNOSIS — N410 Acute prostatitis: Secondary | ICD-10-CM

## 2014-10-02 DIAGNOSIS — Z79899 Other long term (current) drug therapy: Secondary | ICD-10-CM

## 2014-10-02 DIAGNOSIS — Z5111 Encounter for antineoplastic chemotherapy: Secondary | ICD-10-CM | POA: Insufficient documentation

## 2014-10-02 DIAGNOSIS — R509 Fever, unspecified: Secondary | ICD-10-CM

## 2014-10-02 DIAGNOSIS — R911 Solitary pulmonary nodule: Secondary | ICD-10-CM | POA: Diagnosis not present

## 2014-10-02 DIAGNOSIS — G4730 Sleep apnea, unspecified: Secondary | ICD-10-CM | POA: Diagnosis not present

## 2014-10-02 DIAGNOSIS — Z87891 Personal history of nicotine dependence: Secondary | ICD-10-CM | POA: Diagnosis not present

## 2014-10-02 DIAGNOSIS — F419 Anxiety disorder, unspecified: Secondary | ICD-10-CM

## 2014-10-02 DIAGNOSIS — K219 Gastro-esophageal reflux disease without esophagitis: Secondary | ICD-10-CM | POA: Diagnosis not present

## 2014-10-02 DIAGNOSIS — C880 Waldenstrom macroglobulinemia: Secondary | ICD-10-CM

## 2014-10-02 DIAGNOSIS — N451 Epididymitis: Secondary | ICD-10-CM

## 2014-10-02 DIAGNOSIS — Z8781 Personal history of (healed) traumatic fracture: Secondary | ICD-10-CM | POA: Diagnosis not present

## 2014-10-02 DIAGNOSIS — Z8579 Personal history of other malignant neoplasms of lymphoid, hematopoietic and related tissues: Secondary | ICD-10-CM

## 2014-10-02 DIAGNOSIS — R300 Dysuria: Secondary | ICD-10-CM

## 2014-10-02 DIAGNOSIS — F329 Major depressive disorder, single episode, unspecified: Secondary | ICD-10-CM | POA: Diagnosis not present

## 2014-10-02 DIAGNOSIS — R109 Unspecified abdominal pain: Secondary | ICD-10-CM

## 2014-10-02 DIAGNOSIS — N508 Other specified disorders of male genital organs: Secondary | ICD-10-CM

## 2014-10-02 LAB — COMPREHENSIVE METABOLIC PANEL
ALT: 17 U/L (ref 17–63)
ANION GAP: 8 (ref 5–15)
AST: 26 U/L (ref 15–41)
Albumin: 4.4 g/dL (ref 3.5–5.0)
Alkaline Phosphatase: 82 U/L (ref 38–126)
BILIRUBIN TOTAL: 1.1 mg/dL (ref 0.3–1.2)
BUN: 22 mg/dL — AB (ref 6–20)
CO2: 28 mmol/L (ref 22–32)
CREATININE: 0.89 mg/dL (ref 0.61–1.24)
Calcium: 8.8 mg/dL — ABNORMAL LOW (ref 8.9–10.3)
Chloride: 100 mmol/L — ABNORMAL LOW (ref 101–111)
GFR calc non Af Amer: >60 mL/min (ref >60)
GLUCOSE: 93 mg/dL (ref 65–99)
Potassium: 4.3 mmol/L (ref 3.5–5.1)
Sodium: 136 mmol/L (ref 135–145)
Total Protein: 7.6 g/dL (ref 6.5–8.1)

## 2014-10-02 LAB — CBC WITH DIFFERENTIAL/PLATELET
BASOS ABS: 0 10*3/uL (ref 0–0.1)
BASOS PCT: 1 %
Eosinophils Absolute: 0 10*3/uL (ref 0–0.7)
Eosinophils Relative: 1 %
HEMATOCRIT: 40.4 % (ref 40.0–52.0)
Hemoglobin: 13.3 g/dL (ref 13.0–18.0)
Lymphocytes Relative: 21 %
Lymphs Abs: 1.4 10*3/uL (ref 1.0–3.6)
MCH: 29.4 pg (ref 26.0–34.0)
MCHC: 32.9 g/dL (ref 32.0–36.0)
MCV: 89.3 fL (ref 80.0–100.0)
Monocytes Absolute: 0.6 10*3/uL (ref 0.2–1.0)
Monocytes Relative: 10 %
NEUTROS ABS: 4.4 10*3/uL (ref 1.4–6.5)
Neutrophils Relative %: 67 %
Platelets: 344 10*3/uL (ref 150–440)
RBC: 4.53 MIL/uL (ref 4.40–5.90)
RDW: 14.8 % — AB (ref 11.5–14.5)
WBC: 6.5 10*3/uL (ref 3.8–10.6)

## 2014-10-02 LAB — URINALYSIS COMPLETE WITH MICROSCOPIC (ARMC ONLY)
Bacteria, UA: NONE SEEN
Bilirubin Urine: NEGATIVE
Glucose, UA: NEGATIVE mg/dL
Hgb urine dipstick: NEGATIVE
KETONES UR: NEGATIVE mg/dL
LEUKOCYTES UA: NEGATIVE
NITRITE: NEGATIVE
PH: 7 (ref 5.0–8.0)
Protein, ur: NEGATIVE mg/dL
RBC / HPF: NONE SEEN RBC/hpf (ref 0–5)
Specific Gravity, Urine: 1.008 (ref 1.005–1.030)
Squamous Epithelial / LPF: NONE SEEN

## 2014-10-02 MED ORDER — CIPROFLOXACIN HCL 500 MG PO TABS: 500.0000 mg | ORAL_TABLET | Freq: Two times a day (BID) | ORAL | Status: DC

## 2014-10-02 NOTE — Progress Notes (Signed)
Patient does have living will.  Former smoker.  Since last Wednesday, patient complains of being hot and weak.  Very moody.  Also having soft stools that end with watery diarrhea,  Patient states he has abdominal and chest pain but no SOB, Also complains of being nauseated.  When he eats he has a lot of gas.  Feels sore from his esophagus into his abdomen. It is hard to swallow.

## 2014-10-03 ENCOUNTER — Encounter: Payer: Self-pay | Admitting: Oncology

## 2014-10-03 LAB — PROTEIN ELECTROPHORESIS, SERUM
A/G Ratio: 1.4 (ref 0.7–1.7)
ALPHA-1-GLOBULIN: 0.2 g/dL (ref 0.0–0.4)
ALPHA-2-GLOBULIN: 0.8 g/dL (ref 0.4–1.0)
Albumin ELP: 3.9 g/dL (ref 2.9–4.4)
Beta Globulin: 1 g/dL (ref 0.7–1.3)
Gamma Globulin: 0.8 g/dL (ref 0.4–1.8)
Globulin, Total: 2.8 g/dL (ref 2.2–3.9)
M-Spike, %: 0.4 g/dL — ABNORMAL HIGH
Total Protein ELP: 6.7 g/dL (ref 6.0–8.5)

## 2014-10-03 NOTE — Progress Notes (Signed)
[place] @ [facility] Telephone:(336) [phone]  Fax:(336) [phone]     JASE REEP OB: 1951-01-13  MR#: [id number]  YKD#:[id number]  Patient Care Team: Maryland Pink, MD as PCP - General (Family Medicine) Clarene Essex, MD (Gastroenterology)  CHIEF COMPLAINT:  Chief Complaint  Patient presents with  . Follow-up   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1,Waldenstrm's macroglobulinemi diagnosis in February of 2012 treated with induction  rituximab therapy  weekly x4 followed by a maintenancerituximab every 2 months   for 2 years , last dose was in March of 2014. 2,n June of 2015 patient was admitted in Hospital with dizziness and vertigo.  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  Patient was referred to [facility] for plasmapheresis and had undergone one plasmapheresis with significant improvement in symptoms.  Patient is here for further followup and treatment consideration. 3.started on rituximab 375 mg per meter squared weekly x4  Finished in July of 2015 4.pulmonary nodule was PET negative (November, 2015) Patient was started on  ibrutanib  but with poor tolerance so discontinued in November of 2015 5.patient was started on maintenance Rituxan therapy, April 18, 2014  No history exists.    Oncology Flowsheet 08/28/2011 10/26/2011 12/30/2011 02/29/2012 04/25/2012 07/01/2012 08/20/2014  diphenhydrAMINE (BENADRYL) PO 50 mg 50 mg 50 mg 50 mg 50 mg 50 mg 50 mg  ondansetron (ZOFRAN) IJ - - - - - - -  riTUXimab (RITUXAN) IV 375 mg/m2 375 mg/m2 375 mg/m2 375 mg/m2 375 mg/m2 375 mg/m2 375 mg/m2    INTERVAL HISTORY: 64 year old gentleman is here for further evaluation and maintenance chemotherapy with Rituxan.  Tolerating treatment very well.  Dizziness has improved.  Patient is now retired.  Working outside.  Has intermittent swelling of lower extremity.  No sinus infection no chills or fever June 22, 20166 patient came today for  the follow-up complaining of low lower abdominal discomfort.  Stinging and burning while passing urine.  Right scrotal pain.  Low-grade fever no chills.  Feeling very uncomfortable.   REVIEW OF SYSTEMS:   GENERAL:  Feels good.  Active.  No fevers, sweats or weight loss. PERFORMANCE STATUS (ECOG):  01 HEENT:  No visual changes, runny nose, sore throat, mouth sores or tenderness. Lungs: No shortness of breath or cough.  No hemoptysis. Cardiac:  No chest pain, palpitations, orthopnea, or PND. GI:  No nausea, vomiting, diarrhea, constipation, melena or hematochezia. GU: Dysuria hematuria. l:  No back pain.  No joint pain.  No muscle tenderness. Extremities:  No pain or swelling. Skin:  No rashes or skin changes. Neuro:  No headache, numbness or weakness, balance or coordination issues. Endocrine:  No diabetes, thyroid issues, hot flashes or night sweats. Psych:  No mood changes, depression or anxiety. Pain: Right lower abdominal pain.   Review of systems:  All other systems reviewed and found to be negative.  As per HPI. Otherwise, a complete review of systems is negatve.  PAST MEDICAL HISTORY: Past Medical History  Diagnosis Date  . Anemia     prev bone marrow bx 02/11/10  . GERD (gastroesophageal reflux disease)   . Colitis 2004  . Anxiety   . 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. Sleep apnea 09-12-10    Uses CPAP  . Depression   . Need for prophylactic vaccination and inoculation against influenza 01/31/2013  . Bone cancer     PAST SURGICAL HISTORY: Past Surgical History  Procedure Laterality Date  . Nasal fracture surgery    .  Colonoscopy  03/17/2011    Procedure: COLONOSCOPY;  Surgeon: Jeryl Columbia, MD;  Location: [facility];  Service: Endoscopy;  Laterality: N/A;  . Hip fracture surgery  January 2013    R hip    FAMILY HISTORy No significant family history     ADVANCED DIRECTIVES:   Advance Directives (For Healthcare)          Patient does have  will                                             HEALTH MAINTENANCE: History  Substance Use Topics  . Smoking status: Former Smoker    Quit date: 04/14/2007  . Smokeless tobacco: Never Used  . Alcohol Use: No      Allergies  Allergen Reactions  . Ibrutinib Nausea Only    Current Outpatient Prescriptions  Medication Sig Dispense Refill  . dexlansoprazole (DEXILANT) 60 MG capsule Take 60 mg by mouth daily.      Marland Kitchen PARoxetine (PAXIL) 40 MG tablet Take 40 mg by mouth every morning.    . Vitamin D, Cholecalciferol, 1000 UNITS TABS Take by mouth.    . ciprofloxacin (CIPRO) 500 MG tablet Take 1 tablet (500 mg total) by mouth 2 (two) times daily. 20 tablet 0  . VITAMIN D, ERGOCALCIFEROL, PO Take by mouth daily.     No current facility-administered medications for this visit.    OBJECTIVE:  Filed Vitals:   10/02/14 1045  BP: 125/78  Pulse: 78  Temp: 96.2 F (35.7 C)     Body mass index is 21.01 kg/(m^2).    ECOG FS:1 - Symptomatic but completely ambulatory  PHYSICAL EXAM: GENERAL:  Well developed, well nourished, sitting comfortably in the exam room in no acute distress. MENTAL STATUS:  Alert and oriented to person, place and time. HEAD:  Normocephalic, atraumatic, face symmetric, no Cushingoid features. EYES:  eyes.  Pupils equal round and reactive to light and accomodation.  No conjunctivitis or scleral icterus. ENT:  Oropharynx clear without lesion.  Tongue normal. Mucous membranes moist.  RESPIRATORY:  Clear to auscultation without rales, wheezes or rhonchi. CARDIOVASCULAR:  Regular rate and rhythm without murmur, rub or gallop.  ABDOMEN:  Soft, non-tender, with active bowel sounds, and no hepatosplenomegaly.  No masses.  Tenderness in the lower abdominal area BACK:  No CVA tenderness.  No tenderness on percussion of the back or rib cage. SKIN:  No rashes, ulcers or lesions. EXTREMITIES: No edema, no skin discoloration or tenderness.  No palpable cords. LYMPH NODES: No  palpable cervical, supraclavicular, axillary or inguinal adenopathy  NEUROLOGICAL: Unremarkable. PSYCH:  Appropriate.  GU: Tenderness in the right   Epididymis  LAB RESULTS:  Appointment on 10/02/2014  Component Date Value Ref Range Status  . Sodium 10/02/2014 136  135 - 145 mmol/L Final  . Potassium 10/02/2014 4.3  3.5 - 5.1 mmol/L Final  . Chloride 10/02/2014 100* 101 - 111 mmol/L Final  . CO2 10/02/2014 28  22 - 32 mmol/L Final  . Glucose, Bld 10/02/2014 93  65 - 99 mg/dL Final  . BUN 10/02/2014 22* 6 - 20 mg/dL Final  . Creatinine, Ser 10/02/2014 0.89  0.61 - 1.24 mg/dL Final  . Calcium 10/02/2014 8.8* 8.9 - 10.3 mg/dL Final  . Total Protein 10/02/2014 7.6  6.5 - 8.1 g/dL Final  . Albumin 10/02/2014 4.4  3.5 - 5.0  g/dL Final  . AST 10/02/2014 26  15 - 41 U/L Final  . ALT 10/02/2014 17  17 - 63 U/L Final  . Alkaline Phosphatase 10/02/2014 82  38 - 126 U/L Final  . Total Bilirubin 10/02/2014 1.1  0.3 - 1.2 mg/dL Final  . GFR calc non Af Amer 10/02/2014 >60  >60 mL/min Final  . GFR calc Af Amer 10/02/2014 >60  >60 mL/min Final   Comment: (NOTE) The eGFR has been calculated using the CKD EPI equation. This calculation has not been validated in all clinical situations. eGFR's persistently <60 mL/min signify possible Chronic Kidney Disease.   . Anion gap 10/02/2014 8  5 - 15 Final  . WBC 10/02/2014 6.5  3.8 - 10.6 K/uL Final  . RBC 10/02/2014 4.53  4.40 - 5.90 MIL/uL Final  . Hemoglobin 10/02/2014 13.3  13.0 - 18.0 g/dL Final  . HCT 10/02/2014 40.4  40.0 - 52.0 % Final  . MCV 10/02/2014 89.3  80.0 - 100.0 fL Final  . MCH 10/02/2014 29.4  26.0 - 34.0 pg Final  . MCHC 10/02/2014 32.9  32.0 - 36.0 g/dL Final  . RDW 10/02/2014 14.8* 11.5 - 14.5 % Final  . Platelets 10/02/2014 344  150 - 440 K/uL Final  . Neutrophils Relative % 10/02/2014 67   Final  . Neutro Abs 10/02/2014 4.4  1.4 - 6.5 K/uL Final  . Lymphocytes Relative 10/02/2014 21   Final  . Lymphs Abs 10/02/2014 1.4  1.0 -  3.6 K/uL Final  . Monocytes Relative 10/02/2014 10   Final  . Monocytes Absolute 10/02/2014 0.6  0.2 - 1.0 K/uL Final  . Eosinophils Relative 10/02/2014 1   Final  . Eosinophils Absolute 10/02/2014 0.0  0 - 0.7 K/uL Final  . Basophils Relative 10/02/2014 1   Final  . Basophils Absolute 10/02/2014 0.0  0 - 0.1 K/uL Final  . Color, Urine 10/02/2014 STRAW* YELLOW Final  . APPearance 10/02/2014 CLEAR* CLEAR Final  . Glucose, UA 10/02/2014 NEGATIVE  NEGATIVE mg/dL Final  . Bilirubin Urine 10/02/2014 NEGATIVE  NEGATIVE Final  . Ketones, ur 10/02/2014 NEGATIVE  NEGATIVE mg/dL Final  . Specific Gravity, Urine 10/02/2014 1.008  1.005 - 1.030 Final  . Hgb urine dipstick 10/02/2014 NEGATIVE  NEGATIVE Final  . pH 10/02/2014 7.0  5.0 - 8.0 Final  . Protein, ur 10/02/2014 NEGATIVE  NEGATIVE mg/dL Final  . Nitrite 10/02/2014 NEGATIVE  NEGATIVE Final  . Leukocytes, UA 10/02/2014 NEGATIVE  NEGATIVE Final  . RBC / HPF 10/02/2014 NONE SEEN  0 - 5 RBC/hpf Final  . WBC, UA 10/02/2014 0-5  0 - 5 WBC/hpf Final  . Bacteria, UA 10/02/2014 NONE SEEN  NONE SEEN Final  . Squamous Epithelial / LPF 10/02/2014 NONE SEEN  NONE SEEN Final   Lab Results  Component Value Date   KPAFRELGTCHN 1.38 05/30/2013   LAMBDASER 2.11 05/30/2013   KAPLAMBRATIO 0.65 05/30/2013    Impression Waldenstrm's macroglobulinemia in remission Maintenance therapy with rituximab Lung nodule which is being followed regularly by CT scan.  Stable Patient has quit smoking.  MEDICAL DECISION MAKING:  Acute prostatitis and  Epididymitis  Check urinalysis culture Start patient on Cipro 500 mg every 12 hourly for 10 days If no improvement to call me  Patient expressed understanding and was in agreement with this plan. He also understands that He can call clinic at any time with any questions, concerns, or complaints.    No matching staging information was found for the patient.  Forest Gleason, MD   10/03/2014 8:35 AM

## 2014-10-19 ENCOUNTER — Other Ambulatory Visit: Payer: Self-pay | Admitting: *Deleted

## 2014-10-19 DIAGNOSIS — C880 Waldenstrom macroglobulinemia: Secondary | ICD-10-CM

## 2014-10-22 ENCOUNTER — Encounter: Payer: Self-pay | Admitting: Oncology

## 2014-10-22 ENCOUNTER — Inpatient Hospital Stay: Payer: 59

## 2014-10-22 ENCOUNTER — Inpatient Hospital Stay: Payer: 59 | Attending: Oncology | Admitting: Oncology

## 2014-10-22 VITALS — BP 116/78 | HR 69 | Temp 95.7°F | Wt 153.4 lb

## 2014-10-22 DIAGNOSIS — C8800 Waldenstrom macroglobulinemia not having achieved remission: Secondary | ICD-10-CM

## 2014-10-22 DIAGNOSIS — R911 Solitary pulmonary nodule: Secondary | ICD-10-CM | POA: Diagnosis not present

## 2014-10-22 DIAGNOSIS — Z79899 Other long term (current) drug therapy: Secondary | ICD-10-CM | POA: Diagnosis not present

## 2014-10-22 DIAGNOSIS — Z87891 Personal history of nicotine dependence: Secondary | ICD-10-CM | POA: Diagnosis not present

## 2014-10-22 DIAGNOSIS — Z5111 Encounter for antineoplastic chemotherapy: Secondary | ICD-10-CM | POA: Diagnosis not present

## 2014-10-22 LAB — CBC WITH DIFFERENTIAL/PLATELET
BASOS ABS: 0 10*3/uL (ref 0–0.1)
BASOS PCT: 1 %
EOS PCT: 2 %
Eosinophils Absolute: 0.1 10*3/uL (ref 0–0.7)
HEMATOCRIT: 40.2 % (ref 40.0–52.0)
HEMOGLOBIN: 13.3 g/dL (ref 13.0–18.0)
LYMPHS PCT: 24 %
Lymphs Abs: 1.1 10*3/uL (ref 1.0–3.6)
MCH: 29.7 pg (ref 26.0–34.0)
MCHC: 32.9 g/dL (ref 32.0–36.0)
MCV: 90.1 fL (ref 80.0–100.0)
MONO ABS: 0.5 10*3/uL (ref 0.2–1.0)
MONOS PCT: 10 %
Neutro Abs: 3 10*3/uL (ref 1.4–6.5)
Neutrophils Relative %: 63 %
Platelets: 331 10*3/uL (ref 150–440)
RBC: 4.47 MIL/uL (ref 4.40–5.90)
RDW: 14.3 % (ref 11.5–14.5)
WBC: 4.7 10*3/uL (ref 3.8–10.6)

## 2014-10-22 LAB — COMPREHENSIVE METABOLIC PANEL
ALK PHOS: 79 U/L (ref 38–126)
ALT: 18 U/L (ref 17–63)
ANION GAP: 7 (ref 5–15)
AST: 24 U/L (ref 15–41)
Albumin: 4.4 g/dL (ref 3.5–5.0)
BUN: 30 mg/dL — AB (ref 6–20)
CO2: 28 mmol/L (ref 22–32)
CREATININE: 0.93 mg/dL (ref 0.61–1.24)
Calcium: 8.8 mg/dL — ABNORMAL LOW (ref 8.9–10.3)
Chloride: 100 mmol/L — ABNORMAL LOW (ref 101–111)
GLUCOSE: 104 mg/dL — AB (ref 65–99)
Potassium: 4.6 mmol/L (ref 3.5–5.1)
SODIUM: 135 mmol/L (ref 135–145)
Total Bilirubin: 1.3 mg/dL — ABNORMAL HIGH (ref 0.3–1.2)
Total Protein: 7.4 g/dL (ref 6.5–8.1)

## 2014-10-22 LAB — LACTATE DEHYDROGENASE: LDH: 138 U/L (ref 98–192)

## 2014-10-22 MED ORDER — ACETAMINOPHEN 325 MG PO TABS
650.0000 mg | ORAL_TABLET | Freq: Once | ORAL | Status: AC
  Administered 2014-10-22: 650 mg via ORAL
  Filled 2014-10-22: qty 2

## 2014-10-22 MED ORDER — SODIUM CHLORIDE 0.9 % IV SOLN: 375.0000 mg/m2 | Freq: Once | INTRAVENOUS | Status: DC

## 2014-10-22 MED ORDER — SODIUM CHLORIDE 0.9 % IV SOLN
Freq: Once | INTRAVENOUS | Status: AC
  Administered 2014-10-22: 11:00:00 via INTRAVENOUS
  Filled 2014-10-22: qty 1000

## 2014-10-22 MED ORDER — DIPHENHYDRAMINE HCL 25 MG PO CAPS
50.0000 mg | ORAL_CAPSULE | Freq: Once | ORAL | Status: AC
  Administered 2014-10-22: 50 mg via ORAL
  Filled 2014-10-22: qty 2

## 2014-10-22 MED ORDER — SODIUM CHLORIDE 0.9 % IV SOLN
375.0000 mg/m2 | Freq: Once | INTRAVENOUS | Status: AC
  Administered 2014-10-22: 700 mg via INTRAVENOUS
  Filled 2014-10-22: qty 60

## 2014-10-22 NOTE — Progress Notes (Signed)
[place] @ [facility] [phone]  [phone]     [name] OB: Sep 24, 1950  MR#: [id number]  ZSW#:[id number]  Patient Care Team: Maryland Pink, MD as PCP - General (Family Medicine) Clarene Essex, MD (Gastroenterology)  CHIEF COMPLAINT:  Chief Complaint  Patient presents with  . Follow-up   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1,Waldenstrm's macroglobulinemi diagnosis in February of 2012 treated with induction  rituximab therapy  weekly x4 followed by a maintenancerituximab every 2 months   for 2 years , last dose was in March of 2014. 2,n June of 2015 patient was admitted in Hospital with dizziness and vertigo.  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  Patient was referred to [facility] for plasmapheresis and had undergone one plasmapheresis with significant improvement in symptoms.  Patient is here for further followup and treatment consideration. 3.started on rituximab 375 mg per meter squared weekly x4  Finished in July of 2015 4.pulmonary nodule was PET negative (November, 2015) Patient was started on  ibrutanib  but with poor tolerance so discontinued in November of 2015 5.patient was started on maintenance Rituxan therapy, April 18, 2014  No history exists.    Oncology Flowsheet 10/26/2011 12/30/2011 02/29/2012 04/25/2012 07/01/2012 08/20/2014 10/22/2014  diphenhydrAMINE (BENADRYL) PO 50 mg 50 mg 50 mg 50 mg 50 mg 50 mg 50 mg  ondansetron (ZOFRAN) IJ - - - - - - -  riTUXimab (RITUXAN) IV 375 mg/m2 375 mg/m2 375 mg/m2 375 mg/m2 375 mg/m2 375 mg/m2 375 mg/m2    INTERVAL HISTORY: 64 year old gentleman is here for further evaluation and maintenance chemotherapy with Rituxan.  Tolerating treatment very well.  Dizziness has improved.  Patient is now retired.  Working outside.  Has intermittent swelling of lower extremity.  No sinus infection no chills or fever June 22, 20166 patient came today for  the follow-up complaining of low lower abdominal discomfort.  Stinging and burning while passing urine.  Right scrotal pain.  Low-grade fever no chills.  Feeling very uncomfortable. October 22, 2014 Patient is feeling better after was treated with Cipro.  Patient had   EPIDIDYMITIS  and is completely resolved.  No chills.  No fever. No dizziness.  Here for maintenance rituximab therapy.  REVIEW OF SYSTEMS:   GENERAL:  Feels good.  Active.  No fevers, sweats or weight loss. PERFORMANCE STATUS (ECOG):  01 HEENT:  No visual changes, runny nose, sore throat, mouth sores or tenderness. Lungs: No shortness of breath or cough.  No hemoptysis. Cardiac:  No chest pain, palpitations, orthopnea, or PND. GI:  No nausea, vomiting, diarrhea, constipation, melena or hematochezia. GU: Dysuria hematuria. l:  No back pain.  No joint pain.  No muscle tenderness. Extremities:  No pain or swelling. Skin:  No rashes or skin changes. Neuro:  No headache, numbness or weakness, balance or coordination issues. Endocrine:  No diabetes, thyroid issues, hot flashes or night sweats. Psych:  No mood changes, depression or anxiety. Pain: Right lower abdominal pain.   Review of systems:  All other systems reviewed and found to be negative.  As per HPI. Otherwise, a complete review of systems is negatve.  PAST MEDICAL HISTORY: Past Medical History  Diagnosis Date  . Anemia     prev bone marrow bx 02/11/10  . GERD (gastroesophageal reflux disease)   . Colitis 2004  . Anxiety   . 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. Sleep apnea 09-12-10    Uses CPAP  . Depression   .  Need for prophylactic vaccination and inoculation against influenza 01/31/2013  . Bone cancer     PAST SURGICAL HISTORY: Past Surgical History  Procedure Laterality Date  . Nasal fracture surgery    . Colonoscopy  03/17/2011    Procedure: COLONOSCOPY;  Surgeon: Jeryl Columbia, MD;  Location: [facility];  Service: Endoscopy;  Laterality: N/A;  . Hip  fracture surgery  January 2013    R hip    FAMILY HISTORy No significant family history     ADVANCED DIRECTIVES:   Patient does have advance healthcare directive, Patient   does not desire to make any chanGE               HEALTH MAINTENANCE: History  Substance Use Topics  . Smoking status: Former Smoker    Quit date: 04/14/2007  . Smokeless tobacco: Never Used  . Alcohol Use: No      Allergies  Allergen Reactions  . Ibrutinib Nausea Only    Current Outpatient Prescriptions  Medication Sig Dispense Refill  . ciprofloxacin (CIPRO) 500 MG tablet Take 1 tablet (500 mg total) by mouth 2 (two) times daily. 20 tablet 0  . dexlansoprazole (DEXILANT) 60 MG capsule Take 60 mg by mouth daily.      Marland Kitchen PARoxetine (PAXIL) 40 MG tablet Take 40 mg by mouth every morning.    . Vitamin D, Cholecalciferol, 1000 UNITS TABS Take by mouth.    Marland Kitchen VITAMIN D, ERGOCALCIFEROL, PO Take by mouth daily.     No current facility-administered medications for this visit.    OBJECTIVE:  Filed Vitals:   10/22/14 0911  BP: 116/78  Pulse: 69  Temp: 95.7 F (35.4 C)     Body mass index is 21.1 kg/(m^2).    ECOG FS:1 - Symptomatic but completely ambulatory  PHYSICAL EXAM: GENERAL:  Well developed, well nourished, sitting comfortably in the exam room in no acute distress. MENTAL STATUS:  Alert and oriented to person, place and time. HEAD:  Normocephalic, atraumatic, face symmetric, no Cushingoid features. EYES:  eyes.  Pupils equal round and reactive to light and accomodation.  No conjunctivitis or scleral icterus. ENT:  Oropharynx clear without lesion.  Tongue normal. Mucous membranes moist.  RESPIRATORY:  Clear to auscultation without rales, wheezes or rhonchi. CARDIOVASCULAR:  Regular rate and rhythm without murmur, rub or gallop.  ABDOMEN:  Soft, non-tender, with active bowel sounds, and no hepatosplenomegaly.  No masses.  Tenderness in the lower abdominal area BACK:  No CVA tenderness.  No  tenderness on percussion of the back or rib cage. SKIN:  No rashes, ulcers or lesions. EXTREMITIES: No edema, no skin discoloration or tenderness.  No palpable cords. LYMPH NODES: No palpable cervical, supraclavicular, axillary or inguinal adenopathy  NEUROLOGICAL: Unremarkable. PSYCH:  Appropriate.  GU: Improved swelling in and pain in the scrotal area LAB RESULTS:  Appointment on 10/22/2014  Component Date Value Ref Range Status  . WBC 10/22/2014 4.7  3.8 - 10.6 K/uL Final  . RBC 10/22/2014 4.47  4.40 - 5.90 MIL/uL Final  . Hemoglobin 10/22/2014 13.3  13.0 - 18.0 g/dL Final  . HCT 10/22/2014 40.2  40.0 - 52.0 % Final  . MCV 10/22/2014 90.1  80.0 - 100.0 fL Final  . MCH 10/22/2014 29.7  26.0 - 34.0 pg Final  . MCHC 10/22/2014 32.9  32.0 - 36.0 g/dL Final  . RDW 10/22/2014 14.3  11.5 - 14.5 % Final  . Platelets 10/22/2014 331  150 - 440 K/uL Final  . Neutrophils  Relative % 10/22/2014 63   Final  . Neutro Abs 10/22/2014 3.0  1.4 - 6.5 K/uL Final  . Lymphocytes Relative 10/22/2014 24   Final  . Lymphs Abs 10/22/2014 1.1  1.0 - 3.6 K/uL Final  . Monocytes Relative 10/22/2014 10   Final  . Monocytes Absolute 10/22/2014 0.5  0.2 - 1.0 K/uL Final  . Eosinophils Relative 10/22/2014 2   Final  . Eosinophils Absolute 10/22/2014 0.1  0 - 0.7 K/uL Final  . Basophils Relative 10/22/2014 1   Final  . Basophils Absolute 10/22/2014 0.0  0 - 0.1 K/uL Final  . LDH 10/22/2014 138  98 - 192 U/L Final  . Sodium 10/22/2014 135  135 - 145 mmol/L Final  . Potassium 10/22/2014 4.6  3.5 - 5.1 mmol/L Final  . Chloride 10/22/2014 100* 101 - 111 mmol/L Final  . CO2 10/22/2014 28  22 - 32 mmol/L Final  . Glucose, Bld 10/22/2014 104* 65 - 99 mg/dL Final  . BUN 10/22/2014 30* 6 - 20 mg/dL Final  . Creatinine, Ser 10/22/2014 0.93  0.61 - 1.24 mg/dL Final  . Calcium 10/22/2014 8.8* 8.9 - 10.3 mg/dL Final  . Total Protein 10/22/2014 7.4  6.5 - 8.1 g/dL Final  . Albumin 10/22/2014 4.4  3.5 - 5.0 g/dL Final  .  AST 10/22/2014 24  15 - 41 U/L Final  . ALT 10/22/2014 18  17 - 63 U/L Final  . Alkaline Phosphatase 10/22/2014 79  38 - 126 U/L Final  . Total Bilirubin 10/22/2014 1.3* 0.3 - 1.2 mg/dL Final  . GFR calc non Af Amer 10/22/2014 >60  >60 mL/min Final  . GFR calc Af Amer 10/22/2014 >60  >60 mL/min Final   Comment: (NOTE) The eGFR has been calculated using the CKD EPI equation. This calculation has not been validated in all clinical situations. eGFR's persistently <60 mL/min signify possible Chronic Kidney Disease.   . Anion gap 10/22/2014 7  5 - 15 Final   Lab Results  Component Value Date   KPAFRELGTCHN 1.38 05/30/2013   LAMBDASER 2.11 05/30/2013   KAPLAMBRATIO 0.65 05/30/2013    Impression Waldenstrm's macroglobulinemia in remission Maintenance therapy with rituximab Lung nodule which is being followed regularly by CT scan.  Stable Patient has quit smoking.  MEDICAL DECISION MAKING:  Significant improvement in general condition.  We will continue rituximab for maintenance and recheck light chain and SIEP ring next appointment  Patient expressed understanding and was in agreement with this plan. He also understands that He can call clinic at any time with any questions, concerns, or complaints.    No matching staging information was found for the patient.  Forest Gleason, MD   10/22/2014 2:53 PM

## 2014-10-22 NOTE — Progress Notes (Signed)
Patient does have living will.  Former smoker. 

## 2014-10-23 LAB — PROTEIN ELECTROPHORESIS, SERUM
A/G Ratio: 1.3 (ref 0.7–1.7)
ALBUMIN ELP: 3.9 g/dL (ref 2.9–4.4)
Alpha-1-Globulin: 0.2 g/dL (ref 0.0–0.4)
Alpha-2-Globulin: 0.8 g/dL (ref 0.4–1.0)
Beta Globulin: 1 g/dL (ref 0.7–1.3)
GAMMA GLOBULIN: 1 g/dL (ref 0.4–1.8)
Globulin, Total: 2.9 g/dL (ref 2.2–3.9)
M-SPIKE, %: 0.5 g/dL — AB
Total Protein ELP: 6.8 g/dL (ref 6.0–8.5)

## 2014-10-23 LAB — KAPPA/LAMBDA LIGHT CHAINS
Kappa free light chain: 13.38 mg/L (ref 3.30–19.40)
Kappa, lambda light chain ratio: 0.69 (ref 0.26–1.65)
Lambda free light chains: 19.42 mg/L (ref 5.71–26.30)

## 2014-11-02 LAB — URINE CULTURE

## 2014-12-24 ENCOUNTER — Inpatient Hospital Stay: Payer: 59

## 2014-12-24 ENCOUNTER — Inpatient Hospital Stay: Payer: 59 | Attending: Oncology | Admitting: Oncology

## 2014-12-24 ENCOUNTER — Encounter: Payer: Self-pay | Admitting: Oncology

## 2014-12-24 VITALS — BP 126/74 | HR 71 | Temp 96.6°F | Resp 18

## 2014-12-24 VITALS — BP 123/73 | HR 69 | Temp 96.2°F | Resp 18 | Ht 71.5 in | Wt 157.0 lb

## 2014-12-24 DIAGNOSIS — K219 Gastro-esophageal reflux disease without esophagitis: Secondary | ICD-10-CM | POA: Insufficient documentation

## 2014-12-24 DIAGNOSIS — Z5111 Encounter for antineoplastic chemotherapy: Secondary | ICD-10-CM | POA: Diagnosis not present

## 2014-12-24 DIAGNOSIS — G4730 Sleep apnea, unspecified: Secondary | ICD-10-CM | POA: Insufficient documentation

## 2014-12-24 DIAGNOSIS — Z87891 Personal history of nicotine dependence: Secondary | ICD-10-CM | POA: Diagnosis not present

## 2014-12-24 DIAGNOSIS — Z79899 Other long term (current) drug therapy: Secondary | ICD-10-CM

## 2014-12-24 DIAGNOSIS — C880 Waldenstrom macroglobulinemia: Secondary | ICD-10-CM

## 2014-12-24 DIAGNOSIS — F329 Major depressive disorder, single episode, unspecified: Secondary | ICD-10-CM | POA: Diagnosis not present

## 2014-12-24 DIAGNOSIS — F419 Anxiety disorder, unspecified: Secondary | ICD-10-CM | POA: Diagnosis not present

## 2014-12-24 DIAGNOSIS — R911 Solitary pulmonary nodule: Secondary | ICD-10-CM | POA: Diagnosis not present

## 2014-12-24 LAB — COMPREHENSIVE METABOLIC PANEL
ALT: 17 U/L (ref 17–63)
AST: 22 U/L (ref 15–41)
Albumin: 4.3 g/dL (ref 3.5–5.0)
Alkaline Phosphatase: 69 U/L (ref 38–126)
Anion gap: 6 (ref 5–15)
BILIRUBIN TOTAL: 1 mg/dL (ref 0.3–1.2)
BUN: 24 mg/dL — AB (ref 6–20)
CHLORIDE: 105 mmol/L (ref 101–111)
CO2: 28 mmol/L (ref 22–32)
CREATININE: 0.85 mg/dL (ref 0.61–1.24)
Calcium: 9 mg/dL (ref 8.9–10.3)
GFR calc Af Amer: >60 mL/min (ref >60)
GLUCOSE: 106 mg/dL — AB (ref 65–99)
Potassium: 4.2 mmol/L (ref 3.5–5.1)
Sodium: 139 mmol/L (ref 135–145)
TOTAL PROTEIN: 7.3 g/dL (ref 6.5–8.1)

## 2014-12-24 LAB — CBC WITH DIFFERENTIAL/PLATELET
BASOS ABS: 0 10*3/uL (ref 0–0.1)
Basophils Relative: 1 %
Eosinophils Absolute: 0.1 10*3/uL (ref 0–0.7)
Eosinophils Relative: 2 %
HEMATOCRIT: 39 % — AB (ref 40.0–52.0)
Hemoglobin: 13.4 g/dL (ref 13.0–18.0)
LYMPHS PCT: 21 %
Lymphs Abs: 1 10*3/uL (ref 1.0–3.6)
MCH: 30.3 pg (ref 26.0–34.0)
MCHC: 34.3 g/dL (ref 32.0–36.0)
MCV: 88.4 fL (ref 80.0–100.0)
Monocytes Absolute: 0.5 10*3/uL (ref 0.2–1.0)
Monocytes Relative: 10 %
NEUTROS ABS: 3.2 10*3/uL (ref 1.4–6.5)
NEUTROS PCT: 66 %
Platelets: 309 10*3/uL (ref 150–440)
RBC: 4.42 MIL/uL (ref 4.40–5.90)
RDW: 14.1 % (ref 11.5–14.5)
WBC: 4.7 10*3/uL (ref 3.8–10.6)

## 2014-12-24 LAB — VITAMIN B12: VITAMIN B 12: 223 pg/mL (ref 180–914)

## 2014-12-24 MED ORDER — ACETAMINOPHEN 325 MG PO TABS
650.0000 mg | ORAL_TABLET | Freq: Once | ORAL | Status: AC
  Administered 2014-12-24: 650 mg via ORAL
  Filled 2014-12-24: qty 2

## 2014-12-24 MED ORDER — INFLUENZA VAC SPLIT QUAD 0.5 ML IM SUSY: 0.5000 mL | PREFILLED_SYRINGE | INTRAMUSCULAR | Status: DC

## 2014-12-24 MED ORDER — INFLUENZA VAC SPLIT QUAD 0.5 ML IM SUSY: 0.5000 mL | PREFILLED_SYRINGE | Freq: Once | INTRAMUSCULAR | Status: DC

## 2014-12-24 MED ORDER — DIPHENHYDRAMINE HCL 25 MG PO CAPS
50.0000 mg | ORAL_CAPSULE | Freq: Once | ORAL | Status: AC
  Administered 2014-12-24: 50 mg via ORAL
  Filled 2014-12-24: qty 2

## 2014-12-24 MED ORDER — SODIUM CHLORIDE 0.9 % IV SOLN
Freq: Once | INTRAVENOUS | Status: AC
  Administered 2014-12-24: 11:00:00 via INTRAVENOUS
  Filled 2014-12-24: qty 1000

## 2014-12-24 MED ORDER — SODIUM CHLORIDE 0.9 % IV SOLN: 375.0000 mg/m2 | Freq: Once | INTRAVENOUS | Status: DC

## 2014-12-24 MED ORDER — SODIUM CHLORIDE 0.9 % IV SOLN
375.0000 mg/m2 | Freq: Once | INTRAVENOUS | Status: AC
  Administered 2014-12-24: 700 mg via INTRAVENOUS
  Filled 2014-12-24: qty 60

## 2014-12-24 NOTE — Addendum Note (Signed)
Addended by: Telford Nab on: 12/24/2014 02:04 PM   Modules accepted: Orders

## 2014-12-24 NOTE — Progress Notes (Signed)
[place] @ [facility] [phone]  [phone]) [phone]     SIDDH VANDEVENTER OB: 08/31/1950  MR#: [id number]  ESP#:[id number]  Patient Care Team: Maryland Pink, MD as PCP - General (Family Medicine) Clarene Essex, MD (Gastroenterology)  CHIEF COMPLAINT:  No chief complaint on file.  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1,Waldenstrm's macroglobulinemi diagnosis in February of 2012 treated with induction  rituximab therapy  weekly x4 followed by a maintenancerituximab every 2 months   for 2 years , last dose was in March of 2014. 2,n June of 2015 patient was admitted in Hospital with dizziness and vertigo.  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  Patient was referred to [facility] for plasmapheresis and had undergone one plasmapheresis with significant improvement in symptoms.  Patient is here for further followup and treatment consideration. 3.started on rituximab 375 mg per meter squared weekly x4  Finished in July of 2015 4.pulmonary nodule was PET negative (November, 2015) Patient was started on  ibrutanib  but with poor tolerance so discontinued in November of 2015 5.patient was started on maintenance Rituxan therapy, April 18, 2014  No history exists.    Oncology Flowsheet 10/26/2011 12/30/2011 02/29/2012 04/25/2012 07/01/2012 08/20/2014 10/22/2014  diphenhydrAMINE (BENADRYL) PO 50 mg 50 mg 50 mg 50 mg 50 mg 50 mg 50 mg  ondansetron (ZOFRAN) IJ - - - - - - -  riTUXimab (RITUXAN) IV 375 mg/m2 375 mg/m2 375 mg/m2 375 mg/m2 375 mg/m2 375 mg/m2 375 mg/m2    INTERVAL HISTORY: 64 year old gentleman is here for further evaluation and maintenance chemotherapy with Rituxan.  Tolerating treatment very well.  Dizziness has improved.  Patient is now retired.  Working outside.  Has intermittent swelling of lower extremity.  No sinus infection no chills or fever June 22, 20166 patient came today for the follow-up complaining  of low lower abdominal discomfort.  Stinging and burning while passing urine.  Right scrotal pain.  Low-grade fever no chills.  Feeling very uncomfortable. October 22, 2014 Patient is feeling better after was treated with Cipro.  Patient had   EPIDIDYMITIS  and is completely resolved.  No chills.  No fever. No dizziness.  Here for maintenance rituximab therapy. September, 2016 Patient is here for ongoing evaluation and continuation of maintenance rituximab therapy.  Monoclonal spike last 2 months ago was 0.5.  Patient has occasional headache.  No nausea no vomiting appetite has been stable.  No chills or fever  REVIEW OF SYSTEMS:   GENERAL:  Feels good.  Active.  No fevers, sweats or weight loss. PERFORMANCE STATUS (ECOG):  01 HEENT:  No visual changes, runny nose, sore throat, mouth sores or tenderness. Lungs: No shortness of breath or cough.  No hemoptysis. Cardiac:  No chest pain, palpitations, orthopnea, or PND. GI:  No nausea, vomiting, diarrhea, constipation, melena or hematochezia. GU: Dysuria hematuria. l:  No back pain.  No joint pain.  No muscle tenderness. Extremities:  No pain or swelling. Skin:  No rashes or skin changes. Neuro:  No headache, numbness or weakness, balance or coordination issues. Endocrine:  No diabetes, thyroid issues, hot flashes or night sweats. Psych:  No mood changes, depression or anxiety. Pain: Right lower abdominal pain.   Review of systems:  All other systems reviewed and found to be negative.  As per HPI. Otherwise, a complete review of systems is negatve.  PAST MEDICAL HISTORY: Past Medical History  Diagnosis Date  . Anemia     prev  bone marrow bx 02/11/10  . GERD (gastroesophageal reflux disease)   . Colitis 2004  . Anxiety   . 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. Sleep apnea 09-12-10    Uses CPAP  . Depression   . Need for prophylactic vaccination and inoculation against influenza 01/31/2013  . Bone cancer     PAST SURGICAL HISTORY: Past  Surgical History  Procedure Laterality Date  . Nasal fracture surgery    . Colonoscopy  03/17/2011    Procedure: COLONOSCOPY;  Surgeon: Jeryl Columbia, MD;  Location: [facility];  Service: Endoscopy;  Laterality: N/A;  . Hip fracture surgery  January 2013    R hip    FAMILY HISTORy No significant family history     ADVANCED DIRECTIVES:   Patient does have advance healthcare directive, Patient   does not desire to make any chanGE               HEALTH MAINTENANCE: Social History  Substance Use Topics  . Smoking status: Former Smoker    Quit date: 04/14/2007  . Smokeless tobacco: Never Used  . Alcohol Use: No      Allergies  Allergen Reactions  . Ibrutinib Nausea Only    Current Outpatient Prescriptions  Medication Sig Dispense Refill  . ciprofloxacin (CIPRO) 500 MG tablet Take 1 tablet (500 mg total) by mouth 2 (two) times daily. 20 tablet 0  . dexlansoprazole (DEXILANT) 60 MG capsule Take 60 mg by mouth daily.      Marland Kitchen PARoxetine (PAXIL) 40 MG tablet Take 40 mg by mouth every morning.    . Vitamin D, Cholecalciferol, 1000 UNITS TABS Take by mouth.    Marland Kitchen VITAMIN D, ERGOCALCIFEROL, PO Take by mouth daily.     No current facility-administered medications for this visit.    OBJECTIVE:  There were no vitals filed for this visit.   There is no weight on file to calculate BMI.    ECOG FS:1 - Symptomatic but completely ambulatory  PHYSICAL EXAM: GENERAL:  Well developed, well nourished, sitting comfortably in the exam room in no acute distress. MENTAL STATUS:  Alert and oriented to person, place and time. HEAD:  Normocephalic, atraumatic, face symmetric, no Cushingoid features. EYES:  eyes.  Pupils equal round and reactive to light and accomodation.  No conjunctivitis or scleral icterus. ENT:  Oropharynx clear without lesion.  Tongue normal. Mucous membranes moist.  RESPIRATORY:  Clear to auscultation without rales, wheezes or rhonchi. CARDIOVASCULAR:  Regular rate and  rhythm without murmur, rub or gallop.  ABDOMEN:  Soft, non-tender, with active bowel sounds, and no hepatosplenomegaly.  No masses.  Tenderness in the lower abdominal area BACK:  No CVA tenderness.  No tenderness on percussion of the back or rib cage. SKIN:  No rashes, ulcers or lesions. EXTREMITIES: No edema, no skin discoloration or tenderness.  No palpable cords. LYMPH NODES: No palpable cervical, supraclavicular, axillary or inguinal adenopathy  NEUROLOGICAL: Unremarkable. PSYCH:  Appropriate.  GU: Improved swelling in and pain in the scrotal area LAB RESULTS:  No visits with results within 3 Day(s) from this visit. Latest known visit with results is:  Appointment on 10/22/2014  Component Date Value Ref Range Status  . WBC 10/22/2014 4.7  3.8 - 10.6 K/uL Final  . RBC 10/22/2014 4.47  4.40 - 5.90 MIL/uL Final  . Hemoglobin 10/22/2014 13.3  13.0 - 18.0 g/dL Final  . HCT 10/22/2014 40.2  40.0 - 52.0 % Final  . MCV 10/22/2014 90.1  80.0 - 100.0  fL Final  . MCH 10/22/2014 29.7  26.0 - 34.0 pg Final  . MCHC 10/22/2014 32.9  32.0 - 36.0 g/dL Final  . RDW 10/22/2014 14.3  11.5 - 14.5 % Final  . Platelets 10/22/2014 331  150 - 440 K/uL Final  . Neutrophils Relative % 10/22/2014 63   Final  . Neutro Abs 10/22/2014 3.0  1.4 - 6.5 K/uL Final  . Lymphocytes Relative 10/22/2014 24   Final  . Lymphs Abs 10/22/2014 1.1  1.0 - 3.6 K/uL Final  . Monocytes Relative 10/22/2014 10   Final  . Monocytes Absolute 10/22/2014 0.5  0.2 - 1.0 K/uL Final  . Eosinophils Relative 10/22/2014 2   Final  . Eosinophils Absolute 10/22/2014 0.1  0 - 0.7 K/uL Final  . Basophils Relative 10/22/2014 1   Final  . Basophils Absolute 10/22/2014 0.0  0 - 0.1 K/uL Final  . Total Protein ELP 10/22/2014 6.8  6.0 - 8.5 g/dL Final  . Albumin ELP 10/22/2014 3.9  2.9 - 4.4 g/dL Final  . Alpha-1-Globulin 10/22/2014 0.2  0.0 - 0.4 g/dL Final  . Alpha-2-Globulin 10/22/2014 0.8  0.4 - 1.0 g/dL Final  . Beta Globulin  10/22/2014 1.0  0.7 - 1.3 g/dL Final  . Gamma Globulin 10/22/2014 1.0  0.4 - 1.8 g/dL Final  . M-Spike, % 10/22/2014 0.5* Not Observed g/dL Final  . SPE Interp. 10/22/2014 Comment   Final   Comment: (NOTE) The SPE pattern demonstrates a single peak (M-spike) in the gamma region which may represent monoclonal protein. This peak may also be caused by circulating immune complexes, cryoglobulins, C-reactive protein, fibrinogen or hemolysis.  If clinically indicated, the presence of a monoclonal gammopathy may be confirmed by immuno- fixation, as well as an evaluation of the urine for the presence of Bence-Jones protein. Performed At: [facility] [place], [place] [id number] Lindon Romp MD IZ:[id number]   . Comment 10/22/2014 Comment   Final   Comment: (NOTE) Protein electrophoresis scan will follow via computer, mail, or courier delivery.   Marland Kitchen GLOBULIN, TOTAL 10/22/2014 2.9  2.2 - 3.9 g/dL Corrected  . A/G Ratio 10/22/2014 1.3  0.7 - 1.7 Corrected  . Kappa free light chain 10/22/2014 13.38  3.30 - 19.40 mg/L Final  . Lamda free light chains 10/22/2014 19.42  5.71 - 26.30 mg/L Final  . Kappa, lamda light chain ratio 10/22/2014 0.69  0.26 - 1.65 Final   Comment: (NOTE) Performed At: [facility] [place], [place] [id number] Lindon Romp MD [id number]   . LDH 10/22/2014 138  98 - 192 U/L Final  . Sodium 10/22/2014 135  135 - 145 mmol/L Final  . Potassium 10/22/2014 4.6  3.5 - 5.1 mmol/L Final  . Chloride 10/22/2014 100* 101 - 111 mmol/L Final  . CO2 10/22/2014 28  22 - 32 mmol/L Final  . Glucose, Bld 10/22/2014 104* 65 - 99 mg/dL Final  . BUN 10/22/2014 30* 6 - 20 mg/dL Final  . Creatinine, Ser 10/22/2014 0.93  0.61 - 1.24 mg/dL Final  . Calcium 10/22/2014 8.8* 8.9 - 10.3 mg/dL Final  . Total Protein 10/22/2014 7.4  6.5 - 8.1 g/dL Final  . Albumin 10/22/2014 4.4  3.5 - 5.0 g/dL Final  . AST 10/22/2014 24  15 - 41 U/L Final  .  ALT 10/22/2014 18  17 - 63 U/L Final  . Alkaline Phosphatase 10/22/2014 79  38 - 126 U/L Final  . Total Bilirubin 10/22/2014 1.3* 0.3 -  1.2 mg/dL Final  . GFR calc non Af Amer 10/22/2014 >60  >60 mL/min Final  . GFR calc Af Amer 10/22/2014 >60  >60 mL/min Final   Comment: (NOTE) The eGFR has been calculated using the CKD EPI equation. This calculation has not been validated in all clinical situations. eGFR's persistently <60 mL/min signify possible Chronic Kidney Disease.   . Anion gap 10/22/2014 7  5 - 15 Final   Lab Results  Component Value Date   KPAFRELGTCHN 13.38 10/22/2014   LAMBDASER 19.42 10/22/2014   KAPLAMBRATIO 0.69 10/22/2014    Impression Waldenstrm's macroglobulinemia in remission Maintenance therapy with rituximab Lung nodule which is being followed regularly by CT scan.  Stable Patient has quit smoking.  MEDICAL DECISION MAKING:  Significant improvement in general condition.  We will continue rituximab for maintenance and recheck light chain and SIEP ring next appointment  Patient expressed understanding and was in agreement with this plan. He also understands that He can call clinic at any time with any questions, concerns, or complaints.    No matching staging information was found for the patient.  Forest Gleason, MD   12/24/2014 8:00 AM

## 2014-12-25 LAB — PROTEIN ELECTROPHORESIS, SERUM
A/G Ratio: 1.5 (ref 0.7–1.7)
ALPHA-1-GLOBULIN: 0.2 g/dL (ref 0.0–0.4)
ALPHA-2-GLOBULIN: 0.7 g/dL (ref 0.4–1.0)
Albumin ELP: 3.8 g/dL (ref 2.9–4.4)
Beta Globulin: 0.9 g/dL (ref 0.7–1.3)
GAMMA GLOBULIN: 0.9 g/dL (ref 0.4–1.8)
GLOBULIN, TOTAL: 2.6 g/dL (ref 2.2–3.9)
M-SPIKE, %: 0.5 g/dL — AB
Total Protein ELP: 6.4 g/dL (ref 6.0–8.5)

## 2014-12-25 LAB — KAPPA/LAMBDA LIGHT CHAINS
KAPPA FREE LGHT CHN: 12.71 mg/L (ref 3.30–19.40)
Kappa, lambda light chain ratio: 0.65 (ref 0.26–1.65)
Lambda free light chains: 19.68 mg/L (ref 5.71–26.30)

## 2015-02-25 ENCOUNTER — Encounter: Payer: Self-pay | Admitting: Oncology

## 2015-02-25 ENCOUNTER — Inpatient Hospital Stay (HOSPITAL_BASED_OUTPATIENT_CLINIC_OR_DEPARTMENT_OTHER): Payer: 59 | Admitting: Oncology

## 2015-02-25 ENCOUNTER — Inpatient Hospital Stay: Payer: 59 | Attending: Oncology

## 2015-02-25 ENCOUNTER — Inpatient Hospital Stay: Payer: 59

## 2015-02-25 VITALS — BP 136/78 | HR 82 | Temp 96.3°F | Wt 156.3 lb

## 2015-02-25 DIAGNOSIS — Z87891 Personal history of nicotine dependence: Secondary | ICD-10-CM

## 2015-02-25 DIAGNOSIS — R911 Solitary pulmonary nodule: Secondary | ICD-10-CM

## 2015-02-25 DIAGNOSIS — Z79899 Other long term (current) drug therapy: Secondary | ICD-10-CM

## 2015-02-25 DIAGNOSIS — G4730 Sleep apnea, unspecified: Secondary | ICD-10-CM | POA: Diagnosis not present

## 2015-02-25 DIAGNOSIS — K219 Gastro-esophageal reflux disease without esophagitis: Secondary | ICD-10-CM | POA: Insufficient documentation

## 2015-02-25 DIAGNOSIS — F329 Major depressive disorder, single episode, unspecified: Secondary | ICD-10-CM | POA: Insufficient documentation

## 2015-02-25 DIAGNOSIS — F419 Anxiety disorder, unspecified: Secondary | ICD-10-CM | POA: Diagnosis not present

## 2015-02-25 DIAGNOSIS — C880 Waldenstrom macroglobulinemia: Secondary | ICD-10-CM | POA: Diagnosis not present

## 2015-02-25 LAB — COMPREHENSIVE METABOLIC PANEL
ALT: 17 U/L (ref 17–63)
ANION GAP: 9 (ref 5–15)
AST: 26 U/L (ref 15–41)
Albumin: 4.5 g/dL (ref 3.5–5.0)
Alkaline Phosphatase: 79 U/L (ref 38–126)
BUN: 26 mg/dL — ABNORMAL HIGH (ref 6–20)
CALCIUM: 8.8 mg/dL — AB (ref 8.9–10.3)
CHLORIDE: 101 mmol/L (ref 101–111)
CO2: 26 mmol/L (ref 22–32)
Creatinine, Ser: 0.91 mg/dL (ref 0.61–1.24)
GFR calc non Af Amer: >60 mL/min (ref >60)
Glucose, Bld: 111 mg/dL — ABNORMAL HIGH (ref 65–99)
Potassium: 4.3 mmol/L (ref 3.5–5.1)
SODIUM: 136 mmol/L (ref 135–145)
Total Bilirubin: 1 mg/dL (ref 0.3–1.2)
Total Protein: 7.4 g/dL (ref 6.5–8.1)

## 2015-02-25 LAB — CBC WITH DIFFERENTIAL/PLATELET
BASOS ABS: 0 10*3/uL (ref 0–0.1)
Basophils Relative: 1 %
EOS PCT: 1 %
Eosinophils Absolute: 0.1 10*3/uL (ref 0–0.7)
HCT: 40.7 % (ref 40.0–52.0)
Hemoglobin: 13.8 g/dL (ref 13.0–18.0)
LYMPHS PCT: 20 %
Lymphs Abs: 0.9 10*3/uL — ABNORMAL LOW (ref 1.0–3.6)
MCH: 29.9 pg (ref 26.0–34.0)
MCHC: 33.9 g/dL (ref 32.0–36.0)
MCV: 88.4 fL (ref 80.0–100.0)
Monocytes Absolute: 0.4 10*3/uL (ref 0.2–1.0)
Monocytes Relative: 9 %
Neutro Abs: 3.1 10*3/uL (ref 1.4–6.5)
Neutrophils Relative %: 69 %
PLATELETS: 344 10*3/uL (ref 150–440)
RBC: 4.6 MIL/uL (ref 4.40–5.90)
RDW: 14.5 % (ref 11.5–14.5)
WBC: 4.5 10*3/uL (ref 3.8–10.6)

## 2015-02-25 LAB — MAGNESIUM: MAGNESIUM: 1.9 mg/dL (ref 1.7–2.4)

## 2015-02-25 NOTE — Progress Notes (Signed)
[facility] [phone]  [phone]     [name] OB: 08/07/1950  MR#: [id number]  CSN#:[id number]  Patient Care Team: James Hedrick, MD as PCP - General (Family Medicine) Marc Magod, MD (Gastroenterology)  CHIEF COMPLAINT:  Chief Complaint  Patient presents with  . OTHER   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1,Waldenstrm's macroglobulinemi diagnosis in February of 2012 treated with induction  rituximab therapy  weekly x4 followed by a maintenancerituximab every 2 months   for 2 years , last dose was in March of 2014. 2,n June of 2015 patient was admitted in Hospital with dizziness and vertigo.  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  Patient was referred to [facility] for plasmapheresis and had undergone one plasmapheresis with significant improvement in symptoms.  Patient is here for further followup and treatment consideration. 3.started on rituximab 375 mg per meter squared weekly x4  Finished in July of 2015 4.pulmonary nodule was PET negative (November, 2015) Patient was started on  ibrutanib  but with poor tolerance so discontinued in November of 2015 5.patient was started on maintenance Rituxan therapy, April 18, 2014 6.  Because of crackles symptomatic relief rituximab was discontinued in November of 2016  No history exists.    Oncology Flowsheet 12/30/2011 02/29/2012 04/25/2012 07/01/2012 08/20/2014 10/22/2014 12/24/2014  diphenhydrAMINE (BENADRYL) PO 50 mg 50 mg 50 mg 50 mg 50 mg 50 mg 50 mg  ondansetron (ZOFRAN) IJ - - - - - - -  riTUXimab (RITUXAN) IV 375 mg/m2 375 mg/m2 375 mg/m2 375 mg/m2 375 mg/m2 375 mg/m2 375 mg/m2    INTERVAL HISTORY: 63-year-old gentleman is here for further evaluation and maintenance chemotherapy with Rituxan.  Tolerating treatment very well.  Dizziness has improved.  Patient is now retired.  Working outside.  Has intermittent swelling of lower  extremity.  No sinus infection no chills or fever Patient is here for ongoing evaluation and treatment consideration. No chills fever no headache.  Patient complains of some bilateral axillary pains in lower extremity. Fully  functional.  REVIEW OF SYSTEMS:   GENERAL:  Feels good.  Active.  No fevers, sweats or weight loss. PERFORMANCE STATUS (ECOG):  01 HEENT:  No visual changes, runny nose, sore throat, mouth sores or tenderness. Lungs: No shortness of breath or cough.  No hemoptysis. Cardiac:  No chest pain, palpitations, orthopnea, or PND. GI:  No nausea, vomiting, diarrhea, constipation, melena or hematochezia. GU: Dysuria hematuria. l:  No back pain.  No joint pain.  No muscle tenderness. Extremities:  No pain or swelling. Skin:  No rashes or skin changes. Neuro:  No headache, numbness or weakness, balance or coordination issues. Endocrine:  No diabetes, thyroid issues, hot flashes or night sweats. Psych:  No mood changes, depression or anxiety. Pain: Right lower abdominal pain.   Review of systems:  All other systems reviewed and found to be negative.  As per HPI. Otherwise, a complete review of systems is negatve.  PAST MEDICAL HISTORY: Past Medical History  Diagnosis Date  . Anemia     prev bone marrow bx 02/11/10  . GERD (gastroesophageal reflux disease)   . Colitis 2004  . Anxiety   . 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HCC)   . Sleep apnea 09-12-10    Uses CPAP  . Depression   . Need for prophylactic vaccination and inoculation against influenza 01/31/2013  . Bone cancer (HCC)     PAST SURGICAL HISTORY: Past Surgical History  Procedure   Laterality Date  . Nasal fracture surgery    . Colonoscopy  03/17/2011    Procedure: COLONOSCOPY;  Surgeon: Jeryl Columbia, MD;  Location: [facility];  Service: Endoscopy;  Laterality: N/A;  . Hip fracture surgery  January 2013    R hip    FAMILY HISTORy No significant family history     ADVANCED DIRECTIVES:   Patient does  have advance healthcare directive, Patient   does not desire to make any chanGE               HEALTH MAINTENANCE: Social History  Substance Use Topics  . Smoking status: Former Smoker    Quit date: 04/14/2007  . Smokeless tobacco: Never Used  . Alcohol Use: No      Allergies  Allergen Reactions  . Ibrutinib Nausea Only    Current Outpatient Prescriptions  Medication Sig Dispense Refill  . dexlansoprazole (DEXILANT) 60 MG capsule Take 60 mg by mouth daily.      Marland Kitchen PARoxetine (PAXIL) 40 MG tablet Take 40 mg by mouth every morning.    . Vitamin D, Cholecalciferol, 1000 UNITS TABS Take by mouth.     No current facility-administered medications for this visit.    OBJECTIVE:  Filed Vitals:   02/25/15 0905  BP: 136/78  Pulse: 82  Temp: 96.3 F (35.7 C)     Body mass index is 21.5 kg/(m^2).    ECOG FS:1 - Symptomatic but completely ambulatory  PHYSICAL EXAM: GENERAL:  Well developed, well nourished, sitting comfortably in the exam room in no acute distress. MENTAL STATUS:  Alert and oriented to person, place and time. HEAD:  Normocephalic, atraumatic, face symmetric, no Cushingoid features. EYES:  eyes.  Pupils equal round and reactive to light and accomodation.  No conjunctivitis or scleral icterus. ENT:  Oropharynx clear without lesion.  Tongue normal. Mucous membranes moist.  RESPIRATORY:  Clear to auscultation without rales, wheezes or rhonchi. CARDIOVASCULAR:  Regular rate and rhythm without murmur, rub or gallop.  ABDOMEN:  Soft, non-tender, with active bowel sounds, and no hepatosplenomegaly.  No masses.  Tenderness in the lower abdominal area BACK:  No CVA tenderness.  No tenderness on percussion of the back or rib cage. SKIN:  No rashes, ulcers or lesions. EXTREMITIES: No edema, no skin discoloration or tenderness.  No palpable cords. LYMPH NODES: No palpable cervical, supraclavicular, axillary or inguinal adenopathy  NEUROLOGICAL: Unremarkable. PSYCH:   Appropriate.  GU: Improved swelling in and pain in the scrotal area LAB RESULTS:  Appointment on 02/25/2015  Component Date Value Ref Range Status  . Sodium 02/25/2015 136  135 - 145 mmol/L Final  . Potassium 02/25/2015 4.3  3.5 - 5.1 mmol/L Final  . Chloride 02/25/2015 101  101 - 111 mmol/L Final  . CO2 02/25/2015 26  22 - 32 mmol/L Final  . Glucose, Bld 02/25/2015 111* 65 - 99 mg/dL Final  . BUN 02/25/2015 26* 6 - 20 mg/dL Final  . Creatinine, Ser 02/25/2015 0.91  0.61 - 1.24 mg/dL Final  . Calcium 02/25/2015 8.8* 8.9 - 10.3 mg/dL Final  . Total Protein 02/25/2015 7.4  6.5 - 8.1 g/dL Final  . Albumin 02/25/2015 4.5  3.5 - 5.0 g/dL Final  . AST 02/25/2015 26  15 - 41 U/L Final  . ALT 02/25/2015 17  17 - 63 U/L Final  . Alkaline Phosphatase 02/25/2015 79  38 - 126 U/L Final  . Total Bilirubin 02/25/2015 1.0  0.3 - 1.2 mg/dL Final  . GFR calc  non Af Amer 02/25/2015 >60  >60 mL/min Final  . GFR calc Af Amer 02/25/2015 >60  >60 mL/min Final   Comment: (NOTE) The eGFR has been calculated using the CKD EPI equation. This calculation has not been validated in all clinical situations. eGFR's persistently <60 mL/min signify possible Chronic Kidney Disease.   . Anion gap 02/25/2015 9  5 - 15 Final  . Magnesium 02/25/2015 1.9  1.7 - 2.4 mg/dL Final  . WBC 02/25/2015 4.5  3.8 - 10.6 K/uL Final  . RBC 02/25/2015 4.60  4.40 - 5.90 MIL/uL Final  . Hemoglobin 02/25/2015 13.8  13.0 - 18.0 g/dL Final  . HCT 02/25/2015 40.7  40.0 - 52.0 % Final  . MCV 02/25/2015 88.4  80.0 - 100.0 fL Final  . MCH 02/25/2015 29.9  26.0 - 34.0 pg Final  . MCHC 02/25/2015 33.9  32.0 - 36.0 g/dL Final  . RDW 02/25/2015 14.5  11.5 - 14.5 % Final  . Platelets 02/25/2015 344  150 - 440 K/uL Final  . Neutrophils Relative % 02/25/2015 69   Final  . Neutro Abs 02/25/2015 3.1  1.4 - 6.5 K/uL Final  . Lymphocytes Relative 02/25/2015 20   Final  . Lymphs Abs 02/25/2015 0.9* 1.0 - 3.6 K/uL Final  . Monocytes Relative  02/25/2015 9   Final  . Monocytes Absolute 02/25/2015 0.4  0.2 - 1.0 K/uL Final  . Eosinophils Relative 02/25/2015 1   Final  . Eosinophils Absolute 02/25/2015 0.1  0 - 0.7 K/uL Final  . Basophils Relative 02/25/2015 1   Final  . Basophils Absolute 02/25/2015 0.0  0 - 0.1 K/uL Final   Lab Results  Component Value Date   KPAFRELGTCHN 12.71 12/24/2014   LAMBDASER 19.68 12/24/2014   KAPLAMBRATIO 0.65 12/24/2014    Impression Waldenstrm's macroglobulinemia in remission Maintenance therapy with rituximab Because patient is symptomatically better.  No major complaints.  IgM spike has dropped significantly. I discussed possibility of cumulative side effect of ongoing rituximab therapy Lymphopenia recurrent sinus infection Plan told patient that we would like to hold off any further treatment unless patient has significant symptoms associated with rising IgM spike Patient is agreeable to it Total duration of visit was 25minutes.  50% or more time was spent in counseling patient and family regarding prognosis and options of treatment and available resources Lung nodule which is being followed regularly by CT scan.  Stable Patient has quit smoking.  MEDICAL DECISION MAKING:  Significant improvement in general condition.  We will continue rituximab for maintenance and recheck light chain and SIEP ring next appointment  Patient expressed understanding and was in agreement with this plan. He also understands that He can call clinic at any time with any questions, concerns, or complaints.    No matching staging information was found for the patient.  Janak Choksi, MD   02/25/2015 9:52 AM  

## 2015-02-25 NOTE — Progress Notes (Signed)
Patient complains of being lightheaded at times and also states he is having hot flashes.

## 2015-02-26 LAB — MULTIPLE MYELOMA PANEL, SERUM
ALBUMIN/GLOB SERPL: 1.5 (ref 0.7–1.7)
ALPHA2 GLOB SERPL ELPH-MCNC: 0.8 g/dL (ref 0.4–1.0)
Albumin SerPl Elph-Mcnc: 4 g/dL (ref 2.9–4.4)
Alpha 1: 0.2 g/dL (ref 0.0–0.4)
B-GLOBULIN SERPL ELPH-MCNC: 0.9 g/dL (ref 0.7–1.3)
GAMMA GLOB SERPL ELPH-MCNC: 0.9 g/dL (ref 0.4–1.8)
GLOBULIN, TOTAL: 2.8 g/dL (ref 2.2–3.9)
IGG (IMMUNOGLOBIN G), SERUM: 515 mg/dL — AB (ref 700–1600)
IGM, SERUM: 546 mg/dL — AB (ref 20–172)
IgA: 114 mg/dL (ref 61–437)
M Protein SerPl Elph-Mcnc: 0.5 g/dL — ABNORMAL HIGH
Total Protein ELP: 6.8 g/dL (ref 6.0–8.5)

## 2015-02-26 LAB — PROTEIN ELECTROPHORESIS, SERUM
A/G Ratio: 1.3 (ref 0.7–1.7)
ALPHA-1-GLOBULIN: 0.2 g/dL (ref 0.0–0.4)
Albumin ELP: 4 g/dL (ref 2.9–4.4)
Alpha-2-Globulin: 0.7 g/dL (ref 0.4–1.0)
Beta Globulin: 1 g/dL (ref 0.7–1.3)
GAMMA GLOBULIN: 1 g/dL (ref 0.4–1.8)
GLOBULIN, TOTAL: 3 g/dL (ref 2.2–3.9)
M-SPIKE, %: 0.5 g/dL — AB
TOTAL PROTEIN ELP: 7 g/dL (ref 6.0–8.5)

## 2015-02-26 LAB — KAPPA/LAMBDA LIGHT CHAINS
Kappa free light chain: 14.34 mg/L (ref 3.30–19.40)
Kappa, lambda light chain ratio: 0.76 (ref 0.26–1.65)
Lambda free light chains: 18.89 mg/L (ref 5.71–26.30)

## 2015-03-14 ENCOUNTER — Other Ambulatory Visit: Payer: Self-pay | Admitting: *Deleted

## 2015-03-14 ENCOUNTER — Inpatient Hospital Stay: Payer: 59

## 2015-03-14 ENCOUNTER — Encounter: Payer: Self-pay | Admitting: Oncology

## 2015-03-14 ENCOUNTER — Inpatient Hospital Stay (HOSPITAL_BASED_OUTPATIENT_CLINIC_OR_DEPARTMENT_OTHER): Payer: 59 | Admitting: Oncology

## 2015-03-14 ENCOUNTER — Inpatient Hospital Stay: Payer: 59 | Attending: Oncology

## 2015-03-14 VITALS — BP 131/83 | HR 84 | Temp 97.1°F | Wt 155.4 lb

## 2015-03-14 DIAGNOSIS — Z79899 Other long term (current) drug therapy: Secondary | ICD-10-CM | POA: Diagnosis not present

## 2015-03-14 DIAGNOSIS — R509 Fever, unspecified: Secondary | ICD-10-CM | POA: Diagnosis not present

## 2015-03-14 DIAGNOSIS — C880 Waldenstrom macroglobulinemia: Secondary | ICD-10-CM

## 2015-03-14 DIAGNOSIS — Z9221 Personal history of antineoplastic chemotherapy: Secondary | ICD-10-CM | POA: Insufficient documentation

## 2015-03-14 DIAGNOSIS — R42 Dizziness and giddiness: Secondary | ICD-10-CM | POA: Diagnosis not present

## 2015-03-14 DIAGNOSIS — Z8579 Personal history of other malignant neoplasms of lymphoid, hematopoietic and related tissues: Secondary | ICD-10-CM | POA: Diagnosis present

## 2015-03-14 DIAGNOSIS — R51 Headache: Secondary | ICD-10-CM | POA: Diagnosis not present

## 2015-03-14 DIAGNOSIS — F419 Anxiety disorder, unspecified: Secondary | ICD-10-CM | POA: Insufficient documentation

## 2015-03-14 DIAGNOSIS — J069 Acute upper respiratory infection, unspecified: Secondary | ICD-10-CM | POA: Diagnosis not present

## 2015-03-14 DIAGNOSIS — M545 Low back pain: Secondary | ICD-10-CM | POA: Insufficient documentation

## 2015-03-14 DIAGNOSIS — Z87891 Personal history of nicotine dependence: Secondary | ICD-10-CM | POA: Diagnosis not present

## 2015-03-14 DIAGNOSIS — R531 Weakness: Secondary | ICD-10-CM

## 2015-03-14 DIAGNOSIS — G4730 Sleep apnea, unspecified: Secondary | ICD-10-CM | POA: Insufficient documentation

## 2015-03-14 DIAGNOSIS — F329 Major depressive disorder, single episode, unspecified: Secondary | ICD-10-CM | POA: Diagnosis not present

## 2015-03-14 DIAGNOSIS — K219 Gastro-esophageal reflux disease without esophagitis: Secondary | ICD-10-CM | POA: Insufficient documentation

## 2015-03-14 DIAGNOSIS — R05 Cough: Secondary | ICD-10-CM | POA: Insufficient documentation

## 2015-03-14 LAB — CBC WITH DIFFERENTIAL/PLATELET
BASOS PCT: 1 %
Basophils Absolute: 0 10*3/uL (ref 0–0.1)
Eosinophils Absolute: 0 10*3/uL (ref 0–0.7)
Eosinophils Relative: 1 %
HEMATOCRIT: 40.6 % (ref 40.0–52.0)
HEMOGLOBIN: 13.6 g/dL (ref 13.0–18.0)
LYMPHS ABS: 0.9 10*3/uL — AB (ref 1.0–3.6)
Lymphocytes Relative: 14 %
MCH: 29.4 pg (ref 26.0–34.0)
MCHC: 33.6 g/dL (ref 32.0–36.0)
MCV: 87.3 fL (ref 80.0–100.0)
MONOS PCT: 12 %
Monocytes Absolute: 0.7 10*3/uL (ref 0.2–1.0)
NEUTROS ABS: 4.5 10*3/uL (ref 1.4–6.5)
NEUTROS PCT: 72 %
Platelets: 340 10*3/uL (ref 150–440)
RBC: 4.64 MIL/uL (ref 4.40–5.90)
RDW: 14.6 % — ABNORMAL HIGH (ref 11.5–14.5)
WBC: 6.2 10*3/uL (ref 3.8–10.6)

## 2015-03-14 LAB — COMPREHENSIVE METABOLIC PANEL
ALBUMIN: 4.6 g/dL (ref 3.5–5.0)
ALK PHOS: 92 U/L (ref 38–126)
ALT: 18 U/L (ref 17–63)
ANION GAP: 10 (ref 5–15)
AST: 24 U/L (ref 15–41)
BILIRUBIN TOTAL: 1 mg/dL (ref 0.3–1.2)
BUN: 19 mg/dL (ref 6–20)
CALCIUM: 9.1 mg/dL (ref 8.9–10.3)
CO2: 27 mmol/L (ref 22–32)
CREATININE: 0.92 mg/dL (ref 0.61–1.24)
Chloride: 99 mmol/L — ABNORMAL LOW (ref 101–111)
GFR calc Af Amer: >60 mL/min (ref >60)
GFR calc non Af Amer: >60 mL/min (ref >60)
GLUCOSE: 94 mg/dL (ref 65–99)
Potassium: 4.1 mmol/L (ref 3.5–5.1)
SODIUM: 136 mmol/L (ref 135–145)
TOTAL PROTEIN: 7.7 g/dL (ref 6.5–8.1)

## 2015-03-14 MED ORDER — PREDNISONE 10 MG PO TABS
ORAL_TABLET | ORAL | Status: DC
Start: 2015-03-14 — End: 2015-03-20

## 2015-03-14 MED ORDER — LEVOFLOXACIN 500 MG PO TABS: 500.0000 mg | ORAL_TABLET | Freq: Every day | ORAL | Status: DC

## 2015-03-14 MED ORDER — SODIUM CHLORIDE 0.9 % IV SOLN
10.0000 mg | Freq: Once | INTRAVENOUS | Status: AC
  Administered 2015-03-14: 10 mg via INTRAVENOUS
  Filled 2015-03-14: qty 1

## 2015-03-14 MED ORDER — SODIUM CHLORIDE 0.9 % IV SOLN
Freq: Once | INTRAVENOUS | Status: AC
  Administered 2015-03-14: 14:00:00 via INTRAVENOUS
  Filled 2015-03-14: qty 1000

## 2015-03-15 ENCOUNTER — Encounter: Payer: Self-pay | Admitting: Oncology

## 2015-03-15 LAB — PROTEIN ELECTROPHORESIS, SERUM
A/G Ratio: 1.4 (ref 0.7–1.7)
ALPHA-1-GLOBULIN: 0.2 g/dL (ref 0.0–0.4)
ALPHA-2-GLOBULIN: 0.8 g/dL (ref 0.4–1.0)
Albumin ELP: 4.1 g/dL (ref 2.9–4.4)
Beta Globulin: 1 g/dL (ref 0.7–1.3)
GAMMA GLOBULIN: 1 g/dL (ref 0.4–1.8)
Globulin, Total: 3 g/dL (ref 2.2–3.9)
M-SPIKE, %: 0.4 g/dL — AB
TOTAL PROTEIN ELP: 7.1 g/dL (ref 6.0–8.5)

## 2015-03-15 LAB — KAPPA/LAMBDA LIGHT CHAINS
KAPPA FREE LGHT CHN: 13.4 mg/L (ref 3.30–19.40)
Kappa, lambda light chain ratio: 0.66 (ref 0.26–1.65)
LAMDA FREE LIGHT CHAINS: 20.18 mg/L (ref 5.71–26.30)

## 2015-03-15 NOTE — Progress Notes (Signed)
[name] @ [facility] Telephone:(336) [phone]  [phone]) [phone]     [name] OB: [birth year]  MR#: [id number]  NOB#:[id number]  Patient Care Team: Maryland Pink, MD as PCP - General (Family Medicine) Clarene Essex, MD (Gastroenterology)  CHIEF COMPLAINT:  Chief Complaint  Patient presents with  . waldenstrom's macroglobulinemia  . Dizziness  . Fatigue  . Pain   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1,Waldenstrm's macroglobulinemi diagnosis in February of 2012 treated with induction  rituximab therapy  weekly x4 followed by a maintenancerituximab every 2 months   for 2 years , last dose was in March of 2014. 2,n June of 2015 patient was admitted in Hospital with dizziness and vertigo.  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  Patient was referred to [facility] for plasmapheresis and had undergone one plasmapheresis with significant improvement in symptoms.  Patient is here for further followup and treatment consideration. 3.started on rituximab 375 mg per meter squared weekly x4  Finished in July of 2015 4.pulmonary nodule was PET negative (November, 2015) Patient was started on  ibrutanib  but with poor tolerance so discontinued in November of 2015 5.patient was started on maintenance Rituxan therapy, April 18, 2014 6.  Because of crackles symptomatic relief rituximab was discontinued in November of 2016  No history exists.    Oncology Flowsheet 02/29/2012 04/25/2012 07/01/2012 08/20/2014 10/22/2014 12/24/2014 03/14/2015  dexamethasone (DECADRON) IV - - - - - - 10 mg  diphenhydrAMINE (BENADRYL) PO 50 mg 50 mg 50 mg 50 mg 50 mg 50 mg -  ondansetron (ZOFRAN) IJ - - - - - - -  riTUXimab (RITUXAN) IV 375 mg/m2 375 mg/m2 375 mg/m2 375 mg/m2 375 mg/m2 375 mg/m2 -    INTERVAL HISTORY:  64 year old gentleman came today as an acute add-on.  I received a phone call from patient's wife that he has been not feeling good for  last few days.'s morning if started feeling dizzy.  He has cough dry hacking cough with expectoration.  Low-grade fever.  Generalized weakness. REVIEW OF SYSTEMS:    general status: Patient is feeling weak and tired.  No change in a performance status.  No chills.  Low-grade fever HEENT: Alopecia.  No evidence of stomatitis Lungs: Cough with expectoration.  Low-grade fever. Cardiac: No chest pain or paroxysmal nocturnal dyspnea GI: No nausea no vomiting no diarrhea no abdominal pain Skin: No rash Lower extremity no swelling Neurological system: No tingling.  No numbness.  No other focal signs Musculoskeletal system no bony pains .  As per HPI. Otherwise, a complete review of systems is negatve.  PAST MEDICAL HISTORY: Past Medical History  Diagnosis Date  . Anemia     prev bone marrow bx 02/11/10  . GERD (gastroesophageal reflux disease)   . Colitis 2004  . Anxiety   . 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[place])   . Sleep apnea 09-12-10    Uses CPAP  . Depression   . Need for prophylactic vaccination and inoculation against influenza 01/31/2013  . Bone cancer ([place])     PAST SURGICAL HISTORY: Past Surgical History  Procedure Laterality Date  . Nasal fracture surgery    . Colonoscopy  03/17/2011    Procedure: COLONOSCOPY;  Surgeon: Jeryl Columbia, MD;  Location: [facility];  Service: Endoscopy;  Laterality: N/A;  . Hip fracture surgery  January 2013    R hip    FAMILY HISTORy No significant family history     ADVANCED DIRECTIVES:  Patient does have advance healthcare directive, Patient   does not desire to make any chanGE               HEALTH MAINTENANCE: Social History  Substance Use Topics  . Smoking status: Former Smoker    Quit date: 04/14/2007  . Smokeless tobacco: Never Used  . Alcohol Use: No      Allergies  Allergen Reactions  . Ibrutinib Nausea Only    Current Outpatient Prescriptions  Medication Sig Dispense Refill  . dexlansoprazole (DEXILANT)  60 MG capsule Take 60 mg by mouth daily.      Marland Kitchen levofloxacin (LEVAQUIN) 500 MG tablet Take 1 tablet (500 mg total) by mouth daily. 7 tablet 0  . PARoxetine (PAXIL) 40 MG tablet Take 40 mg by mouth every morning.    . predniSONE (DELTASONE) 10 MG tablet Start 24m taper by 138mdaily to until finished 21 tablet 0  . Vitamin D, Cholecalciferol, 1000 UNITS TABS Take by mouth.     No current facility-administered medications for this visit.    OBJECTIVE:  Filed Vitals:   03/14/15 1335  BP: 131/83  Pulse: 84  Temp: 97.1 F (36.2 C)     Body mass index is 21.38 kg/(m^2).    ECOG FS:1 - Symptomatic but completely ambulatory  PHYSICAL EXAM: GENERAL:  Well developed, well nourished, sitting comfortably in the exam room in no acute distress. MENTAL STATUS:  Alert and oriented to person, place and time. HEAD:  Normocephalic, atraumatic, face symmetric, no Cushingoid features. EYES:  eyes.  Pupils equal round and reactive to light and accomodation.  No conjunctivitis or scleral icterus. ENT:  Oropharynx clear without lesion.  Tongue normal. Mucous membranes moist.  RESPIRATORY:  Clear to auscultation without rales, wheezes or rhonchi. CARDIOVASCULAR:  Regular rate and rhythm without murmur, rub or gallop.  ABDOMEN:  Soft, non-tender, with active bowel sounds, and no hepatosplenomegaly.  No masses.  Tenderness in the lower abdominal area BACK:  No CVA tenderness.  No tenderness on percussion of the back or rib cage. SKIN:  No rashes, ulcers or lesions. EXTREMITIES: No edema, no skin discoloration or tenderness.  No palpable cords. LYMPH NODES: No palpable cervical, supraclavicular, axillary or inguinal adenopathy  NEUROLOGICAL: Unremarkable. PSYCH:  Appropriate.  GU: Improved swelling in and pain in the scrotal area LAB RESULTS:  Appointment on 03/14/2015  Component Date Value Ref Range Status  . WBC 03/14/2015 6.2  3.8 - 10.6 K/uL Final  . RBC 03/14/2015 4.64  4.40 - 5.90 MIL/uL Final    . Hemoglobin 03/14/2015 13.6  13.0 - 18.0 g/dL Final  . HCT 03/14/2015 40.6  40.0 - 52.0 % Final  . MCV 03/14/2015 87.3  80.0 - 100.0 fL Final  . MCH 03/14/2015 29.4  26.0 - 34.0 pg Final  . MCHC 03/14/2015 33.6  32.0 - 36.0 g/dL Final  . RDW 03/14/2015 14.6* 11.5 - 14.5 % Final  . Platelets 03/14/2015 340  150 - 440 K/uL Final  . Neutrophils Relative % 03/14/2015 72   Final  . Neutro Abs 03/14/2015 4.5  1.4 - 6.5 K/uL Final  . Lymphocytes Relative 03/14/2015 14   Final  . Lymphs Abs 03/14/2015 0.9* 1.0 - 3.6 K/uL Final  . Monocytes Relative 03/14/2015 12   Final  . Monocytes Absolute 03/14/2015 0.7  0.2 - 1.0 K/uL Final  . Eosinophils Relative 03/14/2015 1   Final  . Eosinophils Absolute 03/14/2015 0.0  0 - 0.7 K/uL Final  . Basophils Relative  03/14/2015 1   Final  . Basophils Absolute 03/14/2015 0.0  0 - 0.1 K/uL Final  . Sodium 03/14/2015 136  135 - 145 mmol/L Final  . Potassium 03/14/2015 4.1  3.5 - 5.1 mmol/L Final  . Chloride 03/14/2015 99* 101 - 111 mmol/L Final  . CO2 03/14/2015 27  22 - 32 mmol/L Final  . Glucose, Bld 03/14/2015 94  65 - 99 mg/dL Final  . BUN 03/14/2015 19  6 - 20 mg/dL Final  . Creatinine, Ser 03/14/2015 0.92  0.61 - 1.24 mg/dL Final  . Calcium 03/14/2015 9.1  8.9 - 10.3 mg/dL Final  . Total Protein 03/14/2015 7.7  6.5 - 8.1 g/dL Final  . Albumin 03/14/2015 4.6  3.5 - 5.0 g/dL Final  . AST 03/14/2015 24  15 - 41 U/L Final  . ALT 03/14/2015 18  17 - 63 U/L Final  . Alkaline Phosphatase 03/14/2015 92  38 - 126 U/L Final  . Total Bilirubin 03/14/2015 1.0  0.3 - 1.2 mg/dL Final  . GFR calc non Af Amer 03/14/2015 >60  >60 mL/min Final  . GFR calc Af Amer 03/14/2015 >60  >60 mL/min Final   Comment: (NOTE) The eGFR has been calculated using the CKD EPI equation. This calculation has not been validated in all clinical situations. eGFR's persistently <60 mL/min signify possible Chronic Kidney Disease.   . Anion gap 03/14/2015 10  5 - 15 Final   Lab Results   Component Value Date   KPAFRELGTCHN 14.34 02/25/2015   LAMBDASER 18.89 02/25/2015   KAPLAMBRATIO 0.76 02/25/2015    Impression Waldenstrm's macroglobulinemia in remission Patient is off all therapy at present time Acute on set  o  weakness low-grade fever most likely coinciding with acute respiratory tract infection. Patient's oral intake has decreased. Will hydrate patient.  Will give prednisone 60 mg taper by 10 mg to 0.  Start patient on Levaquin.  If there is no improvement and depending on SIEP further treatment for more or less Strom macroglobulinemia can be recommended. Duration of visit is 25 minutes and 50% of time was spent discussing VARIOUS  options or alleviating care with other physicians social worker    Patient expressed understanding and was in agreement with this plan. He also understands that He can call clinic at any time with any questions, concerns, or complaints.      Forest Gleason, MD   03/15/2015 4:06 PM

## 2015-03-18 ENCOUNTER — Ambulatory Visit: Payer: 59 | Admitting: Oncology

## 2015-03-18 LAB — MULTIPLE MYELOMA PANEL, SERUM
ALBUMIN SERPL ELPH-MCNC: 4.1 g/dL (ref 2.9–4.4)
ALBUMIN/GLOB SERPL: 1.5 (ref 0.7–1.7)
ALPHA2 GLOB SERPL ELPH-MCNC: 0.8 g/dL (ref 0.4–1.0)
Alpha 1: 0.2 g/dL (ref 0.0–0.4)
B-GLOBULIN SERPL ELPH-MCNC: 1 g/dL (ref 0.7–1.3)
Gamma Glob SerPl Elph-Mcnc: 0.9 g/dL (ref 0.4–1.8)
Globulin, Total: 2.9 g/dL (ref 2.2–3.9)
IGG (IMMUNOGLOBIN G), SERUM: 548 mg/dL — AB (ref 700–1600)
IGM, SERUM: 574 mg/dL — AB (ref 20–172)
IgA: 115 mg/dL (ref 61–437)
M Protein SerPl Elph-Mcnc: 0.4 g/dL — ABNORMAL HIGH
TOTAL PROTEIN ELP: 7 g/dL (ref 6.0–8.5)

## 2015-03-20 ENCOUNTER — Encounter: Payer: Self-pay | Admitting: Oncology

## 2015-03-20 ENCOUNTER — Inpatient Hospital Stay (HOSPITAL_BASED_OUTPATIENT_CLINIC_OR_DEPARTMENT_OTHER): Payer: 59 | Admitting: Oncology

## 2015-03-20 ENCOUNTER — Inpatient Hospital Stay: Payer: 59

## 2015-03-20 VITALS — BP 148/92 | HR 80 | Temp 96.3°F | Resp 18 | Wt 156.1 lb

## 2015-03-20 DIAGNOSIS — R51 Headache: Secondary | ICD-10-CM | POA: Diagnosis not present

## 2015-03-20 DIAGNOSIS — C880 Waldenstrom macroglobulinemia: Secondary | ICD-10-CM

## 2015-03-20 DIAGNOSIS — M545 Low back pain: Secondary | ICD-10-CM | POA: Diagnosis not present

## 2015-03-20 DIAGNOSIS — Z79899 Other long term (current) drug therapy: Secondary | ICD-10-CM

## 2015-03-20 DIAGNOSIS — Z8579 Personal history of other malignant neoplasms of lymphoid, hematopoietic and related tissues: Secondary | ICD-10-CM

## 2015-03-20 DIAGNOSIS — M546 Pain in thoracic spine: Secondary | ICD-10-CM

## 2015-03-20 DIAGNOSIS — R519 Headache, unspecified: Secondary | ICD-10-CM

## 2015-03-20 LAB — COMPREHENSIVE METABOLIC PANEL
ALBUMIN: 4.1 g/dL (ref 3.5–5.0)
ALK PHOS: 65 U/L (ref 38–126)
ALT: 13 U/L — AB (ref 17–63)
AST: 17 U/L (ref 15–41)
Anion gap: 8 (ref 5–15)
BILIRUBIN TOTAL: 0.8 mg/dL (ref 0.3–1.2)
BUN: 28 mg/dL — AB (ref 6–20)
CO2: 27 mmol/L (ref 22–32)
Calcium: 9.1 mg/dL (ref 8.9–10.3)
Chloride: 101 mmol/L (ref 101–111)
Creatinine, Ser: 0.85 mg/dL (ref 0.61–1.24)
GFR calc Af Amer: >60 mL/min (ref >60)
GFR calc non Af Amer: >60 mL/min (ref >60)
GLUCOSE: 112 mg/dL — AB (ref 65–99)
POTASSIUM: 4.2 mmol/L (ref 3.5–5.1)
Sodium: 136 mmol/L (ref 135–145)
TOTAL PROTEIN: 6.9 g/dL (ref 6.5–8.1)

## 2015-03-20 LAB — CBC WITH DIFFERENTIAL/PLATELET
BASOS ABS: 0 10*3/uL (ref 0–0.1)
BASOS PCT: 0 %
Eosinophils Absolute: 0 10*3/uL (ref 0–0.7)
Eosinophils Relative: 0 %
HEMATOCRIT: 39 % — AB (ref 40.0–52.0)
HEMOGLOBIN: 13.2 g/dL (ref 13.0–18.0)
LYMPHS ABS: 1 10*3/uL (ref 1.0–3.6)
LYMPHS PCT: 10 %
MCH: 29.7 pg (ref 26.0–34.0)
MCHC: 33.7 g/dL (ref 32.0–36.0)
MCV: 88.1 fL (ref 80.0–100.0)
Monocytes Absolute: 0.7 10*3/uL (ref 0.2–1.0)
Monocytes Relative: 7 %
NEUTROS ABS: 8.4 10*3/uL — AB (ref 1.4–6.5)
NEUTROS PCT: 83 %
Platelets: 386 10*3/uL (ref 150–440)
RBC: 4.43 MIL/uL (ref 4.40–5.90)
RDW: 14.6 % — AB (ref 11.5–14.5)
WBC: 10.2 10*3/uL (ref 3.8–10.6)

## 2015-03-20 NOTE — Progress Notes (Signed)
Patient states he is having pain from his lower back down his legs.  States it hurts to touch his knees.  States this all began after having bone marrow.  Patient states his legs give way on him.  Patient states he gets dizzy and feels like he cannot focus his eyes.  Wants to know if MD will x-ray his head.

## 2015-03-21 ENCOUNTER — Inpatient Hospital Stay: Payer: 59 | Admitting: Oncology

## 2015-03-21 ENCOUNTER — Inpatient Hospital Stay: Payer: 59

## 2015-03-22 ENCOUNTER — Encounter: Payer: Self-pay | Admitting: *Deleted

## 2015-03-22 ENCOUNTER — Other Ambulatory Visit: Payer: Self-pay

## 2015-03-22 DIAGNOSIS — Z87891 Personal history of nicotine dependence: Secondary | ICD-10-CM | POA: Diagnosis not present

## 2015-03-22 DIAGNOSIS — F419 Anxiety disorder, unspecified: Secondary | ICD-10-CM | POA: Diagnosis not present

## 2015-03-22 DIAGNOSIS — R51 Headache: Secondary | ICD-10-CM | POA: Insufficient documentation

## 2015-03-22 DIAGNOSIS — Z79899 Other long term (current) drug therapy: Secondary | ICD-10-CM | POA: Diagnosis not present

## 2015-03-22 DIAGNOSIS — F329 Major depressive disorder, single episode, unspecified: Secondary | ICD-10-CM | POA: Diagnosis not present

## 2015-03-22 DIAGNOSIS — H938X2 Other specified disorders of left ear: Secondary | ICD-10-CM | POA: Insufficient documentation

## 2015-03-22 DIAGNOSIS — R42 Dizziness and giddiness: Secondary | ICD-10-CM | POA: Insufficient documentation

## 2015-03-22 NOTE — ED Notes (Signed)
Pt has hx of bone cancer. Pt has c/o dizziness and headache x 8 days. Pt has been treated by MD for possible infection. Dizziness is exacerbated by position change. Pt c/o crackling sound in L ear and was treated for vertigo in past.

## 2015-03-23 ENCOUNTER — Emergency Department: Payer: 59

## 2015-03-23 ENCOUNTER — Emergency Department
Admission: EM | Admit: 2015-03-23 | Discharge: 2015-03-23 | Disposition: A | Discharge status: Home / Self Care | Payer: 59 | Attending: Emergency Medicine | Admitting: Emergency Medicine

## 2015-03-23 ENCOUNTER — Encounter: Payer: Self-pay | Admitting: Oncology

## 2015-03-23 DIAGNOSIS — F419 Anxiety disorder, unspecified: Secondary | ICD-10-CM

## 2015-03-23 DIAGNOSIS — R42 Dizziness and giddiness: Secondary | ICD-10-CM

## 2015-03-23 DIAGNOSIS — F32A Depression, unspecified: Secondary | ICD-10-CM

## 2015-03-23 DIAGNOSIS — F329 Major depressive disorder, single episode, unspecified: Secondary | ICD-10-CM

## 2015-03-23 LAB — CBC
HEMATOCRIT: 41.1 % (ref 40.0–52.0)
HEMOGLOBIN: 14 g/dL (ref 13.0–18.0)
MCH: 30.3 pg (ref 26.0–34.0)
MCHC: 33.9 g/dL (ref 32.0–36.0)
MCV: 89.2 fL (ref 80.0–100.0)
Platelets: 412 10*3/uL (ref 150–440)
RBC: 4.61 MIL/uL (ref 4.40–5.90)
RDW: 14.7 % — AB (ref 11.5–14.5)
WBC: 9.6 10*3/uL (ref 3.8–10.6)

## 2015-03-23 LAB — COMPREHENSIVE METABOLIC PANEL
ALT: 14 U/L — AB (ref 17–63)
AST: 16 U/L (ref 15–41)
Albumin: 4.2 g/dL (ref 3.5–5.0)
Alkaline Phosphatase: 70 U/L (ref 38–126)
Anion gap: 8 (ref 5–15)
BUN: 23 mg/dL — AB (ref 6–20)
CHLORIDE: 103 mmol/L (ref 101–111)
CO2: 29 mmol/L (ref 22–32)
CREATININE: 1.01 mg/dL (ref 0.61–1.24)
Calcium: 9 mg/dL (ref 8.9–10.3)
GFR calc Af Amer: >60 mL/min (ref >60)
GLUCOSE: 109 mg/dL — AB (ref 65–99)
Potassium: 3.8 mmol/L (ref 3.5–5.1)
SODIUM: 140 mmol/L (ref 135–145)
Total Bilirubin: 0.7 mg/dL (ref 0.3–1.2)
Total Protein: 7 g/dL (ref 6.5–8.1)

## 2015-03-23 LAB — URINALYSIS COMPLETE WITH MICROSCOPIC (ARMC ONLY)
BILIRUBIN URINE: NEGATIVE
Bacteria, UA: NONE SEEN
GLUCOSE, UA: NEGATIVE mg/dL
Hgb urine dipstick: NEGATIVE
Ketones, ur: NEGATIVE mg/dL
Leukocytes, UA: NEGATIVE
Nitrite: NEGATIVE
PH: 5 (ref 5.0–8.0)
Protein, ur: NEGATIVE mg/dL
SQUAMOUS EPITHELIAL / LPF: NONE SEEN
Specific Gravity, Urine: 1.019 (ref 1.005–1.030)

## 2015-03-23 LAB — URINE DRUG SCREEN, QUALITATIVE (ARMC ONLY)
Amphetamines, Ur Screen: NOT DETECTED
BARBITURATES, UR SCREEN: NOT DETECTED
BENZODIAZEPINE, UR SCRN: NOT DETECTED
Cannabinoid 50 Ng, Ur ~~LOC~~: NOT DETECTED
Cocaine Metabolite,Ur ~~LOC~~: NOT DETECTED
MDMA (Ecstasy)Ur Screen: NOT DETECTED
Methadone Scn, Ur: NOT DETECTED
OPIATE, UR SCREEN: NOT DETECTED
PHENCYCLIDINE (PCP) UR S: NOT DETECTED
Tricyclic, Ur Screen: NOT DETECTED

## 2015-03-23 LAB — SALICYLATE LEVEL: Salicylate Lvl: <4 mg/dL (ref 2.8–30.0)

## 2015-03-23 LAB — ETHANOL: Alcohol, Ethyl (B): <5 mg/dL (ref <5)

## 2015-03-23 LAB — ACETAMINOPHEN LEVEL: Acetaminophen (Tylenol), Serum: <10 ug/mL — ABNORMAL LOW (ref 10–30)

## 2015-03-23 MED ORDER — DIAZEPAM 5 MG/ML IJ SOLN
1.0000 mg | Freq: Once | INTRAMUSCULAR | Status: AC
  Administered 2015-03-23: 1 mg via INTRAVENOUS
  Filled 2015-03-23: qty 2

## 2015-03-23 MED ORDER — MECLIZINE HCL 25 MG PO TABS: 25.0000 mg | ORAL_TABLET | Freq: Three times a day (TID) | ORAL | Status: DC | PRN

## 2015-03-23 MED ORDER — SODIUM CHLORIDE 0.9 % IV BOLUS (SEPSIS)
1000.0000 mL | Freq: Once | INTRAVENOUS | Status: AC
  Administered 2015-03-23: 1000 mL via INTRAVENOUS

## 2015-03-23 MED ORDER — CLONAZEPAM 0.5 MG PO TABS
1.0000 mg | ORAL_TABLET | Freq: Once | ORAL | Status: AC
  Administered 2015-03-23: 1 mg via ORAL
  Filled 2015-03-23: qty 2

## 2015-03-23 MED ORDER — CLONAZEPAM 0.5 MG PO TABS: 0.5000 mg | ORAL_TABLET | Freq: Three times a day (TID) | ORAL | Status: DC | PRN

## 2015-03-23 MED ORDER — ONDANSETRON HCL 4 MG/2ML IJ SOLN
4.0000 mg | Freq: Once | INTRAMUSCULAR | Status: AC
  Administered 2015-03-23: 4 mg via INTRAVENOUS
  Filled 2015-03-23: qty 2

## 2015-03-23 MED ORDER — ONDANSETRON 4 MG PO TBDP: 4.0000 mg | ORAL_TABLET | Freq: Three times a day (TID) | ORAL | Status: DC | PRN

## 2015-03-23 NOTE — ED Notes (Signed)
MD Beather Arbour orders no sitter at this time . See nursing communication

## 2015-03-23 NOTE — ED Notes (Signed)
Gave a brief report to Dr. Cherly Beach with [facility] via telephone and set up [place] machine in patient room. Dr. Cherly Beach is currently speaking with the patient. Wife remains at bedside.

## 2015-03-23 NOTE — Discharge Instructions (Signed)
1. You may take medicines as needed for dizziness and nausea (meclizine/Zofran #20). 2. Take anxiety medicine as needed (Klonopin #20). 3. Return to the ER for worsening symptoms, persistent vomiting, difficulty breathing or other concerns.  Dizziness Dizziness is a common problem. It is a feeling of unsteadiness or light-headedness. You may feel like you are about to faint. Dizziness can lead to injury if you stumble or fall. Anyone can become dizzy, but dizziness is more common in older adults. This condition can be caused by a number of things, including medicines, dehydration, or illness. HOME CARE INSTRUCTIONS Taking these steps may help with your condition: Eating and Drinking  Drink enough fluid to keep your urine clear or pale yellow. This helps to keep you from becoming dehydrated. Try to drink more clear fluids, such as water.  Do not drink alcohol.  Limit your caffeine intake if directed by your health care provider.  Limit your salt intake if directed by your health care provider. Activity  Avoid making quick movements.  Rise slowly from chairs and steady yourself until you feel okay.  In the morning, first sit up on the side of the bed. When you feel okay, stand slowly while you hold onto something until you know that your balance is fine.  Move your legs often if you need to stand in one place for a long time. Tighten and relax your muscles in your legs while you are standing.  Do not drive or operate heavy machinery if you feel dizzy.  Avoid bending down if you feel dizzy. Place items in your home so that they are easy for you to reach without leaning over. Lifestyle  Do not use any tobacco products, including cigarettes, chewing tobacco, or electronic cigarettes. If you need help quitting, ask your health care provider.  Try to reduce your stress level, such as with yoga or meditation. Talk with your health care provider if you need help. General  Instructions  Watch your dizziness for any changes.  Take medicines only as directed by your health care provider. Talk with your health care provider if you think that your dizziness is caused by a medicine that you are taking.  Tell a friend or a family member that you are feeling dizzy. If he or she notices any changes in your behavior, have this person call your health care provider.  Keep all follow-up visits as directed by your health care provider. This is important. SEEK MEDICAL CARE IF:  Your dizziness does not go away.  Your dizziness or light-headedness gets worse.  You feel nauseous.  You have reduced hearing.  You have new symptoms.  You are unsteady on your feet or you feel like the room is spinning. SEEK IMMEDIATE MEDICAL CARE IF:  You vomit or have diarrhea and are unable to eat or drink anything.  You have problems talking, walking, swallowing, or using your arms, hands, or legs.  You feel generally weak.  You are not thinking clearly or you have trouble forming sentences. It may take a friend or family member to notice this.  You have chest pain, abdominal pain, shortness of breath, or sweating.  Your vision changes.  You notice any bleeding.  You have a headache.  You have neck pain or a stiff neck.  You have a fever.   This information is not intended to replace advice given to you by your health care provider. Make sure you discuss any questions you have with your health care provider.  Document Released: 09/23/2000 Document Revised: 08/14/2014 Document Reviewed: 03/26/2014 Elsevier Interactive Patient Education [employer].

## 2015-03-23 NOTE — Progress Notes (Signed)
[place] @ [facility] [phone]) [phone]  Fax:([phone]) [phone]     KRISTOFFER BALA OB: [birth year]  MR#: [id number]  ZPH#:[id number]  Patient Care Team: Maryland Pink, MD as PCP - General (Family Medicine) Clarene Essex, MD (Gastroenterology)  CHIEF COMPLAINT:  Chief Complaint  Patient presents with  . 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1,Waldenstrm's macroglobulinemi diagnosis in February of 2012 treated with induction  rituximab therapy  weekly x4 followed by a maintenancerituximab every 2 months   for 2 years , last dose was in March of 2014. 2,n June of 2015 patient was admitted in Hospital with dizziness and vertigo.  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  Patient was referred to [facility] for plasmapheresis and had undergone one plasmapheresis with significant improvement in symptoms.  Patient is here for further followup and treatment consideration. 3.started on rituximab 375 mg per meter squared weekly x4  Finished in July of 2015 4.pulmonary nodule was PET negative (November, 2015) Patient was started on  ibrutanib  but with poor tolerance so discontinued in November of 2015 5.patient was started on maintenance Rituxan therapy, April 18, 2014 6.  Because of crackles symptomatic relief rituximab was discontinued in November of 2016  No history exists.    Oncology Flowsheet 04/25/2012 07/01/2012 08/20/2014 10/22/2014 12/24/2014 03/14/2015 03/23/2015  dexamethasone (DECADRON) IV - - - - - 10 mg -  diazepam (VALIUM) IV - - - - - - 1 mg  diphenhydrAMINE (BENADRYL) PO 50 mg 50 mg 50 mg 50 mg 50 mg - -  ondansetron (ZOFRAN) IJ - - - - - - -  ondansetron (ZOFRAN) IV - - - - - - 4 mg  riTUXimab (RITUXAN) IV 375 mg/m2 375 mg/m2 375 mg/m2 375 mg/m2 375 mg/m2 - -    INTERVAL HISTORY:  64 year old gentleman came today as an acute add-on.  I received a phone call from patient's wife  that he has been not feeling good for last few days.'s morning if started feeling dizzy.  He has cough dry hacking cough with expectoration.  Low-grade fever.  Generalized weakness.  Patient continues to go weak and tired complains of low back pain and pain radiating to both lower extremity.  Also not able to focus.  No chills.  No fever. After taking short course of steroids and antibiotics no improvement in cough  REVIEW OF SYSTEMS:    general status: Patient is feeling weak and tired.  No change in a performance status.  No chills.  Low-grade fever HEENT: Alopecia.  No evidence of stomatitis Lungs: Cough with expectoration.  Low-grade fever. Cardiac: No chest pain or paroxysmal nocturnal dyspnea GI: No nausea no vomiting no diarrhea no abdominal pain Skin: No rash Lower extremity no swelling Neurological system: No tingling.  No numbness.  No other focal signs Musculoskeletal system no bony pains .  As per HPI. Otherwise, a complete review of systems is negatve.  PAST MEDICAL HISTORY: Past Medical History  Diagnosis Date  . Anemia     prev bone marrow bx 02/11/10  . GERD (gastroesophageal reflux disease)   . Colitis 2004  . Anxiety   . 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[place])   . Sleep apnea 09-12-10    Uses CPAP  . Depression   . Need for prophylactic vaccination and inoculation against influenza 01/31/2013  . Bone cancer ([place])     PAST SURGICAL HISTORY: Past Surgical History  Procedure Laterality Date  . Nasal fracture  surgery    . Colonoscopy  03/17/2011    Procedure: COLONOSCOPY;  Surgeon: Jeryl Columbia, MD;  Location: [facility];  Service: Endoscopy;  Laterality: N/A;  . Hip fracture surgery  January 2013    R hip    FAMILY HISTORy No significant family history     ADVANCED DIRECTIVES:   Patient does have advance healthcare directive, Patient   does not desire to make any chanGE               HEALTH MAINTENANCE: Social History  Substance Use Topics  .  Smoking status: Former Smoker    Quit date: 04/14/2007  . Smokeless tobacco: Never Used  . Alcohol Use: No      Allergies  Allergen Reactions  . Ibrutinib Nausea Only    Current Outpatient Prescriptions  Medication Sig Dispense Refill  . dexlansoprazole (DEXILANT) 60 MG capsule Take 60 mg by mouth daily.      Marland Kitchen PARoxetine (PAXIL) 40 MG tablet Take 40 mg by mouth every morning.    . clonazePAM (KLONOPIN) 0.5 MG tablet Take 1 tablet (0.5 mg total) by mouth 3 (three) times daily as needed for anxiety. 20 tablet 0  . meclizine (ANTIVERT) 25 MG tablet Take 1 tablet (25 mg total) by mouth 3 (three) times daily as needed for dizziness or nausea. 20 tablet 0  . ondansetron (ZOFRAN ODT) 4 MG disintegrating tablet Take 1 tablet (4 mg total) by mouth every 8 (eight) hours as needed for nausea or vomiting. 20 tablet 0  . Vitamin D, Cholecalciferol, 1000 UNITS TABS Take by mouth.     No current facility-administered medications for this visit.    OBJECTIVE:  Filed Vitals:   03/20/15 1517  BP: 148/92  Pulse: 80  Temp: 96.3 F (35.7 C)  Resp: 18     Body mass index is 21.47 kg/(m^2).    ECOG FS:1 - Symptomatic but completely ambulatory  PHYSICAL EXAM: GENERAL:  Well developed, well nourished, sitting comfortably in the exam room in no acute distress. MENTAL STATUS:  Alert and oriented to person, place and time. HEAD:  Normocephalic, atraumatic, face symmetric, no Cushingoid features. EYES:  eyes.  Pupils equal round and reactive to light and accomodation.  No conjunctivitis or scleral icterus. ENT:  Oropharynx clear without lesion.  Tongue normal. Mucous membranes moist.  RESPIRATORY:  Clear to auscultation without rales, wheezes or rhonchi. CARDIOVASCULAR:  Regular rate and rhythm without murmur, rub or gallop.  ABDOMEN:  Soft, non-tender, with active bowel sounds, and no hepatosplenomegaly.  No masses.  Tenderness in the lower abdominal area BACK:  No CVA tenderness.  No tenderness  on percussion of the back or rib cage. SKIN:  No rashes, ulcers or lesions. EXTREMITIES: No edema, no skin discoloration or tenderness.  No palpable cords. LYMPH NODES: No palpable cervical, supraclavicular, axillary or inguinal adenopathy  NEUROLOGICAL: Unremarkable. PSYCH:  Appropriate.  GU: Improved swelling in and pain in the scrotal area LAB RESULTS:  Appointment on 03/20/2015  Component Date Value Ref Range Status  . WBC 03/20/2015 10.2  3.8 - 10.6 K/uL Final  . RBC 03/20/2015 4.43  4.40 - 5.90 MIL/uL Final  . Hemoglobin 03/20/2015 13.2  13.0 - 18.0 g/dL Final  . HCT 03/20/2015 39.0* 40.0 - 52.0 % Final  . MCV 03/20/2015 88.1  80.0 - 100.0 fL Final  . MCH 03/20/2015 29.7  26.0 - 34.0 pg Final  . MCHC 03/20/2015 33.7  32.0 - 36.0 g/dL Final  . RDW  03/20/2015 14.6* 11.5 - 14.5 % Final  . Platelets 03/20/2015 386  150 - 440 K/uL Final  . Neutrophils Relative % 03/20/2015 83   Final  . Neutro Abs 03/20/2015 8.4* 1.4 - 6.5 K/uL Final  . Lymphocytes Relative 03/20/2015 10   Final  . Lymphs Abs 03/20/2015 1.0  1.0 - 3.6 K/uL Final  . Monocytes Relative 03/20/2015 7   Final  . Monocytes Absolute 03/20/2015 0.7  0.2 - 1.0 K/uL Final  . Eosinophils Relative 03/20/2015 0   Final  . Eosinophils Absolute 03/20/2015 0.0  0 - 0.7 K/uL Final  . Basophils Relative 03/20/2015 0   Final  . Basophils Absolute 03/20/2015 0.0  0 - 0.1 K/uL Final  . Sodium 03/20/2015 136  135 - 145 mmol/L Final  . Potassium 03/20/2015 4.2  3.5 - 5.1 mmol/L Final  . Chloride 03/20/2015 101  101 - 111 mmol/L Final  . CO2 03/20/2015 27  22 - 32 mmol/L Final  . Glucose, Bld 03/20/2015 112* 65 - 99 mg/dL Final  . BUN 03/20/2015 28* 6 - 20 mg/dL Final  . Creatinine, Ser 03/20/2015 0.85  0.61 - 1.24 mg/dL Final  . Calcium 03/20/2015 9.1  8.9 - 10.3 mg/dL Final  . Total Protein 03/20/2015 6.9  6.5 - 8.1 g/dL Final  . Albumin 03/20/2015 4.1  3.5 - 5.0 g/dL Final  . AST 03/20/2015 17  15 - 41 U/L Final  . ALT  03/20/2015 13* 17 - 63 U/L Final  . Alkaline Phosphatase 03/20/2015 65  38 - 126 U/L Final  . Total Bilirubin 03/20/2015 0.8  0.3 - 1.2 mg/dL Final  . GFR calc non Af Amer 03/20/2015 >60  >60 mL/min Final  . GFR calc Af Amer 03/20/2015 >60  >60 mL/min Final   Comment: (NOTE) The eGFR has been calculated using the CKD EPI equation. This calculation has not been validated in all clinical situations. eGFR's persistently <60 mL/min signify possible Chronic Kidney Disease.   . Anion gap 03/20/2015 8  5 - 15 Final   Lab Results  Component Value Date   KPAFRELGTCHN 13.40 03/14/2015   LAMBDASER 20.18 03/14/2015   KAPLAMBRATIO 0.66 03/14/2015    Impression Waldenstrm's macroglobulinemia in remission Patient is off all therapy at present time Exact etiology of not able to focus, headache, low back pain is not clear at present time.  We will get MRI scan of head MRI scan of thoracic spine if needed bone marrow aspiration and biopsy.  All lab data has been reviewed.  Total M spike is only 400 at present time.   Evaluate patient after MRI scan  Duration of visit is 25 minutes and 50% of time was spent discussing VARIOUS  options or alleviating care with other physicians social worker    Patient expressed understanding and was in agreement with this plan. He also understands that He can call clinic at any time with any questions, concerns, or complaints.      Forest Gleason, MD   03/23/2015 11:04 AM

## 2015-03-23 NOTE — ED Provider Notes (Signed)
[facility] Emergency Department Provider Note  ____________________________________________  Time seen: Approximately 1:44 AM  I have reviewed the triage vital signs and the nursing notes.   HISTORY  Chief Complaint Dizziness    HPI [name] is a 64 y.o. male who presents to the ED from home with a chief complaint of headache and dizziness. Patient has a medical history significant for bone cancer, 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who notes an 8 day history of dizziness and gradual-onset, global headache. He describes the dizziness as motion sensation; worse with position change and head movement. States he hears crackling sounds in his left ear. Has a history of vertigo. He was treated by his PCP with a course of Levaquin for presumed sinusitis and prednisone taper without much relief. Symptoms are associated with nausea only. Denies fever, chills, chest pain, shortness of breath, abdominal pain, vision changes, vomiting, diarrhea. Denies recent travel or trauma.   Past Medical History  Diagnosis Date  . Anemia     prev bone marrow bx 02/11/10  . GERD (gastroesophageal reflux disease)   . Colitis 2004  . Anxiety   . 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[place])   . Sleep apnea 09-12-10    Uses CPAP  . Depression   . Need for prophylactic vaccination and inoculation against influenza 01/31/2013  . Bone cancer [facility])     Patient Active Problem List   Diagnosis Date Noted  . Abnormal CAT scan 09/17/2013  . Need for prophylactic vaccination and inoculation against influenza 01/31/2013  . 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[place])   . GERD (gastroesophageal reflux disease)     Past Surgical History  Procedure Laterality Date  . Nasal fracture surgery    . Colonoscopy  03/17/2011    Procedure: COLONOSCOPY;  Surgeon: Jeryl Columbia, MD;  Location: [facility];  Service: Endoscopy;  Laterality: N/A;  . Hip fracture surgery  January 2013    R hip    Current  Outpatient Rx  Name  Route  Sig  Dispense  Refill  . dexlansoprazole (DEXILANT) 60 MG capsule   Oral   Take 60 mg by mouth daily.           Marland Kitchen PARoxetine (PAXIL) 40 MG tablet   Oral   Take 40 mg by mouth every morning.         . Vitamin D, Cholecalciferol, 1000 UNITS TABS   Oral   Take by mouth.           Allergies Ibrutinib  History reviewed. No pertinent family history.  Social History Social History  Substance Use Topics  . Smoking status: Former Smoker    Quit date: 04/14/2007  . Smokeless tobacco: Never Used  . Alcohol Use: No    Review of Systems Constitutional: No fever/chills Eyes: No visual changes. ENT: Positive for crackling sound in left ear. No sore throat. Cardiovascular: Denies chest pain. Respiratory: Denies shortness of breath. Gastrointestinal: No abdominal pain.  No nausea, no vomiting.  No diarrhea.  No constipation. Genitourinary: Negative for dysuria. Musculoskeletal: Negative for back pain. Skin: Negative for rash. Neurological: Positive for dizziness and headache. Negative for focal weakness or numbness. Psychiatric:Positive for depression.  10-point ROS otherwise negative.  ____________________________________________   PHYSICAL EXAM:  VITAL SIGNS: ED Triage Vitals  Enc Vitals Group     BP 03/22/15 2341 106/63 mmHg     Pulse Rate 03/22/15 2341 89     Resp 03/22/15 2341 18     Temp 03/22/15 2341 97.8 F (36.6 C)  Temp Source 03/22/15 2341 Oral     SpO2 03/22/15 2341 99 %     Weight 03/22/15 2341 155 lb (70.308 kg)     Height 03/22/15 2341 5\' 11"  (1.803 m)     Head Cir --      Peak Flow --      Pain Score 03/22/15 2342 4     Pain Loc --      Pain Edu? --      Excl. in [place]? --     Constitutional: Alert and oriented. Well appearing and in no acute distress. Eyes: Conjunctivae are normal. PERRL. EOMI. Ears: Fluid behind left TM. Head: Atraumatic. No sinus tenderness to palpation. Nose: No  congestion/rhinnorhea. Mouth/Throat: Mucous membranes are moist.  Oropharynx mildly erythematous. Neck: No stridor.  No carotid bruits. Cardiovascular: Normal rate, regular rhythm. Grossly normal heart sounds.  Good peripheral circulation. Respiratory: Normal respiratory effort.  No retractions. Lungs CTAB. Gastrointestinal: Soft and nontender. No distention. No abdominal bruits. No CVA tenderness. Musculoskeletal: No lower extremity tenderness nor edema.  No joint effusions. Neurologic:  Normal speech and language. CN II-XII grossly intact. No gross focal neurologic deficits are appreciated. Cerebellar intact. No gait instability. Skin:  Skin is warm, dry and intact. No rash noted. Psychiatric: Mood and affect are flat. Speech and behavior are normal.  ____________________________________________   LABS (all labs ordered are listed, but only abnormal results are displayed)  Labs Reviewed  COMPREHENSIVE METABOLIC PANEL - Abnormal; Notable for the following:    Glucose, Bld 109 (*)    BUN 23 (*)    ALT 14 (*)    All other components within normal limits  ACETAMINOPHEN LEVEL - Abnormal; Notable for the following:    Acetaminophen (Tylenol), Serum <10 (*)    All other components within normal limits  CBC - Abnormal; Notable for the following:    RDW 14.7 (*)    All other components within normal limits  URINALYSIS COMPLETEWITH MICROSCOPIC ([facility] ONLY) - Abnormal; Notable for the following:    Color, Urine YELLOW (*)    APPearance CLEAR (*)    All other components within normal limits  ETHANOL  SALICYLATE LEVEL  URINE DRUG SCREEN, QUALITATIVE ([facility] ONLY)   ____________________________________________  EKG  ED ECG REPORT I, SUNG,JADE J, the attending physician, personally viewed and interpreted this ECG.   Date: 03/23/2015  EKG Time: 2358  Rate: 92  Rhythm: normal EKG, normal sinus rhythm  Axis: Normal  Intervals:none  ST&T Change:  Nonspecific  ____________________________________________  RADIOLOGY  CT head without contrast interpreted per Dr. Marisue Humble: No acute intracranial abnormality. ____________________________________________   PROCEDURES  Procedure(s) performed: None  Critical Care performed: No  ____________________________________________   INITIAL IMPRESSION / ASSESSMENT AND PLAN / ED COURSE  Pertinent labs & imaging results that were available during my care of the patient were reviewed by me and considered in my medical decision making (see chart for details).  64 year old male with a history of bone cancer, 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who presents with a 8 day history of vertigo-type symptoms and headache. Given history of bone cancer, will obtain CT head to evaluate for metastasis. Will administer low-dose IV Valium and Zofran for vertigo symptoms. Patient does not have focal neurological deficits; given the prolonged history of symptoms without focal neurological deficits, I do not feel the patient has suffered an acute CVA. Of note, patient did admit to depression with vague suicidal thoughts on his triage screen. Currently contracts for safety. Will consult [facility] psychiatry to evaluate  the patient in the ED.  ----------------------------------------- 4:34 AM on 03/23/2015 -----------------------------------------  Discussed with [facility] psychiatrist who feels patient is psychiatrically stable to be discharged home. Recommend discontinuing Paxil, recommends low-dose Klonopin as needed for anxiety and outpatient psychiatric follow-up. Patient is improved as far as his dizziness. Will prescribe meclizine and Zofran as needed. Strict return precautions given. Patient and family verbalize understanding and agree with plan of care. ____________________________________________   FINAL CLINICAL IMPRESSION(S) / ED DIAGNOSES  Final diagnoses:  Dizziness  Depression  Anxiety      Paulette Blanch,  MD 03/23/15 [phone]

## 2015-03-23 NOTE — ED Notes (Signed)
MD at bedside. 

## 2015-03-23 NOTE — ED Notes (Signed)
Patient transported to CT 

## 2015-03-23 NOTE — ED Notes (Signed)
Lab called regarding UA, urine sent at this time.

## 2015-03-28 ENCOUNTER — Ambulatory Visit
Admission: RE | Admit: 2015-03-28 | Discharge: 2015-03-28 | Disposition: A | Discharge status: Home / Self Care | Payer: 59 | Source: Ambulatory Visit | Attending: Oncology | Admitting: Oncology

## 2015-03-28 DIAGNOSIS — R51 Headache: Secondary | ICD-10-CM | POA: Insufficient documentation

## 2015-03-28 DIAGNOSIS — I679 Cerebrovascular disease, unspecified: Secondary | ICD-10-CM | POA: Insufficient documentation

## 2015-03-28 DIAGNOSIS — C880 Waldenstrom macroglobulinemia: Secondary | ICD-10-CM

## 2015-03-28 DIAGNOSIS — R519 Headache, unspecified: Secondary | ICD-10-CM

## 2015-03-28 DIAGNOSIS — M546 Pain in thoracic spine: Secondary | ICD-10-CM | POA: Diagnosis present

## 2015-03-28 DIAGNOSIS — M4184 Other forms of scoliosis, thoracic region: Secondary | ICD-10-CM | POA: Insufficient documentation

## 2015-03-28 MED ORDER — GADOBENATE DIMEGLUMINE 529 MG/ML IV SOLN
15.0000 mL | Freq: Once | INTRAVENOUS | Status: AC | PRN
  Administered 2015-03-28: 14 mL via INTRAVENOUS

## 2015-04-01 ENCOUNTER — Inpatient Hospital Stay (HOSPITAL_BASED_OUTPATIENT_CLINIC_OR_DEPARTMENT_OTHER): Payer: 59 | Admitting: Oncology

## 2015-04-01 ENCOUNTER — Encounter: Payer: Self-pay | Admitting: Oncology

## 2015-04-01 VITALS — BP 127/84 | HR 78 | Temp 96.6°F | Wt 157.0 lb

## 2015-04-01 DIAGNOSIS — Z8579 Personal history of other malignant neoplasms of lymphoid, hematopoietic and related tissues: Secondary | ICD-10-CM | POA: Diagnosis not present

## 2015-04-01 DIAGNOSIS — C880 Waldenstrom macroglobulinemia: Secondary | ICD-10-CM

## 2015-04-01 DIAGNOSIS — Z79899 Other long term (current) drug therapy: Secondary | ICD-10-CM

## 2015-04-01 MED ORDER — CLONAZEPAM 0.5 MG PO TABS: 0.5000 mg | ORAL_TABLET | Freq: Three times a day (TID) | ORAL | Status: DC | PRN

## 2015-04-01 NOTE — Progress Notes (Signed)
Patient here for MRI results.  States he continues to be dizzy.  Went to ED Friday night and had CT of head.

## 2015-04-02 ENCOUNTER — Encounter: Payer: Self-pay | Admitting: Oncology

## 2015-04-02 NOTE — Progress Notes (Signed)
[place] @ [facility] [phone]) [phone]  [phone]) [phone]     [name] OB: 1951/03/16  MR#: [id number]  CBU#:[id number]  Patient Care Team: Maryland Pink, MD as PCP - General (Family Medicine) Clarene Essex, MD (Gastroenterology)  CHIEF COMPLAINT:  Chief Complaint  Patient presents with  . Results   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1,Waldenstrm's macroglobulinemi diagnosis in February of 2012 treated with induction  rituximab therapy  weekly x4 followed by a maintenancerituximab every 2 months   for 2 years , last dose was in March of 2014. 2,n June of 2015 patient was admitted in Hospital with dizziness and vertigo.  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  Patient was referred to [facility] for plasmapheresis and had undergone one plasmapheresis with significant improvement in symptoms.  Patient is here for further followup and treatment consideration. 3.started on rituximab 375 mg per meter squared weekly x4  Finished in July of 2015 4.pulmonary nodule was PET negative (November, 2015) Patient was started on  ibrutanib  but with poor tolerance so discontinued in November of 2015 5.patient was started on maintenance Rituxan therapy, April 18, 2014 6.  Because of crackles symptomatic relief rituximab was discontinued in November of 2016  No history exists.    Oncology Flowsheet 04/25/2012 07/01/2012 08/20/2014 10/22/2014 12/24/2014 03/14/2015 03/23/2015  dexamethasone (DECADRON) IV - - - - - 10 mg -  diazepam (VALIUM) IV - - - - - - 1 mg  diphenhydrAMINE (BENADRYL) PO 50 mg 50 mg 50 mg 50 mg 50 mg - -  ondansetron (ZOFRAN) IJ - - - - - - -  ondansetron (ZOFRAN) IV - - - - - - 4 mg  riTUXimab (RITUXAN) IV 375 mg/m2 375 mg/m2 375 mg/m2 375 mg/m2 375 mg/m2 - -    INTERVAL HISTORY:  64 year old gentleman came today as an acute add-on.  I received a phone call from patient's wife that he has been not  feeling good for last few days.'s morning if started feeling dizzy.  He has cough dry hacking cough with expectoration.  Low-grade fever.  Generalized weakness.  Patient continues to go weak and tired complains of low back pain and pain radiating to both lower extremity.  Also not able to focus.  No chills.  No fever. After taking short course of steroids and antibiotics no improvement in cough And had MRI scan and MRI scan of brain. On March 25, 2015 patient ended up in the emergency room Was completely evaluated and evaluated by psychiatric is for severe depression and anxiety  According to family Patient is extremely anxious. Here for further follow-up Patient was prescribed Klonopin but was not taking medication only medication is taking isPaxil  REVIEW OF SYSTEMS:    general status: Patient is feeling weak and tired.  No change in a performance status.  No chills.  Low-grade fever HEENT: Alopecia.  No evidence of stomatitis Lungs: Cough with expectoration.  Low-grade fever. Cardiac: No chest pain or paroxysmal nocturnal dyspnea GI: No nausea no vomiting no diarrhea no abdominal pain Skin: No rash Lower extremity no swelling Neurological system: No tingling.  No numbness.  No other focal signs Musculoskeletal system no bony pains .  As per HPI. Otherwise, a complete review of systems is negatve.  PAST MEDICAL HISTORY: Past Medical History  Diagnosis Date  . Anemia     prev bone marrow bx 02/11/10  . GERD (gastroesophageal reflux disease)   . Colitis 2004  .  Anxiety   . 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[place])   . Sleep apnea 09-12-10    Uses CPAP  . Depression   . Need for prophylactic vaccination and inoculation against influenza 01/31/2013  . Bone cancer ([place])     PAST SURGICAL HISTORY: Past Surgical History  Procedure Laterality Date  . Nasal fracture surgery    . Colonoscopy  03/17/2011    Procedure: COLONOSCOPY;  Surgeon: Jeryl Columbia, MD;  Location: [facility]  ENDOSCOPY;  Service: Endoscopy;  Laterality: N/A;  . Hip fracture surgery  January 2013    R hip    FAMILY HISTORy No significant family history     ADVANCED DIRECTIVES:   Patient does have advance healthcare directive, Patient   does not desire to make any chanGE               HEALTH MAINTENANCE: Social History  Substance Use Topics  . Smoking status: Former Smoker    Quit date: 04/14/2007  . Smokeless tobacco: Never Used  . Alcohol Use: No      Allergies  Allergen Reactions  . Ibrutinib Nausea Only    Current Outpatient Prescriptions  Medication Sig Dispense Refill  . clonazePAM (KLONOPIN) 0.5 MG tablet Take 1 tablet (0.5 mg total) by mouth 3 (three) times daily as needed for anxiety. 30 tablet 3  . dexlansoprazole (DEXILANT) 60 MG capsule Take 60 mg by mouth daily.      . meclizine (ANTIVERT) 25 MG tablet Take 1 tablet (25 mg total) by mouth 3 (three) times daily as needed for dizziness or nausea. 20 tablet 0  . ondansetron (ZOFRAN ODT) 4 MG disintegrating tablet Take 1 tablet (4 mg total) by mouth every 8 (eight) hours as needed for nausea or vomiting. 20 tablet 0  . PARoxetine (PAXIL) 40 MG tablet Take 40 mg by mouth every morning.    . Vitamin D, Cholecalciferol, 1000 UNITS TABS Take by mouth.     No current facility-administered medications for this visit.    OBJECTIVE:  Filed Vitals:   04/01/15 1407  BP: 127/84  Pulse: 78  Temp: 96.6 F (35.9 C)     Body mass index is 21.9 kg/(m^2).    ECOG FS:1 - Symptomatic but completely ambulatory  PHYSICAL EXAM: GENERAL:  Well developed, well nourished, sitting comfortably in the exam room in no acute distress. MENTAL STATUS:  Alert and oriented to person, place and time. HEAD:  Normocephalic, atraumatic, face symmetric, no Cushingoid features. EYES:  eyes.  Pupils equal round and reactive to light and accomodation.  No conjunctivitis or scleral icterus. ENT:  Oropharynx clear without lesion.  Tongue normal.  Mucous membranes moist.  RESPIRATORY:  Clear to auscultation without rales, wheezes or rhonchi. CARDIOVASCULAR:  Regular rate and rhythm without murmur, rub or gallop.  ABDOMEN:  Soft, non-tender, with active bowel sounds, and no hepatosplenomegaly.  No masses.  Tenderness in the lower abdominal area BACK:  No CVA tenderness.  No tenderness on percussion of the back or rib cage. SKIN:  No rashes, ulcers or lesions. EXTREMITIES: No edema, no skin discoloration or tenderness.  No palpable cords. LYMPH NODES: No palpable cervical, supraclavicular, axillary or inguinal adenopathy  NEUROLOGICAL: Unremarkable. PSYCH:  Appropriate.  GU: Improved swelling in and pain in the scrotal area LAB RESULTS:  No visits with results within 3 Day(s) from this visit. Latest known visit with results is:  Admission on 03/23/2015, Discharged on 03/23/2015  Component Date Value Ref Range Status  . Sodium 03/23/2015 140  135 - 145 mmol/L Final  . Potassium 03/23/2015 3.8  3.5 - 5.1 mmol/L Final  . Chloride 03/23/2015 103  101 - 111 mmol/L Final  . CO2 03/23/2015 29  22 - 32 mmol/L Final  . Glucose, Bld 03/23/2015 109* 65 - 99 mg/dL Final  . BUN 03/23/2015 23* 6 - 20 mg/dL Final  . Creatinine, Ser 03/23/2015 1.01  0.61 - 1.24 mg/dL Final  . Calcium 03/23/2015 9.0  8.9 - 10.3 mg/dL Final  . Total Protein 03/23/2015 7.0  6.5 - 8.1 g/dL Final  . Albumin 03/23/2015 4.2  3.5 - 5.0 g/dL Final  . AST 03/23/2015 16  15 - 41 U/L Final  . ALT 03/23/2015 14* 17 - 63 U/L Final  . Alkaline Phosphatase 03/23/2015 70  38 - 126 U/L Final  . Total Bilirubin 03/23/2015 0.7  0.3 - 1.2 mg/dL Final  . GFR calc non Af Amer 03/23/2015 >60  >60 mL/min Final  . GFR calc Af Amer 03/23/2015 >60  >60 mL/min Final   Comment: (NOTE) The eGFR has been calculated using the CKD EPI equation. This calculation has not been validated in all clinical situations. eGFR's persistently <60 mL/min signify possible Chronic Kidney Disease.   .  Anion gap 03/23/2015 8  5 - 15 Final  . Alcohol, Ethyl (B) 03/23/2015 <5  <5 mg/dL Final   Comment:        LOWEST DETECTABLE LIMIT FOR SERUM ALCOHOL IS 5 mg/dL FOR MEDICAL PURPOSES ONLY   . Salicylate Lvl 46/56/8127 <4.0  2.8 - 30.0 mg/dL Final  . Acetaminophen (Tylenol), Serum 03/23/2015 <10* 10 - 30 ug/mL Final   Comment:        THERAPEUTIC CONCENTRATIONS VARY SIGNIFICANTLY. A RANGE OF 10-30 ug/mL MAY BE AN EFFECTIVE CONCENTRATION FOR MANY PATIENTS. HOWEVER, SOME ARE BEST TREATED AT CONCENTRATIONS OUTSIDE THIS RANGE. ACETAMINOPHEN CONCENTRATIONS >150 ug/mL AT 4 HOURS AFTER INGESTION AND >50 ug/mL AT 12 HOURS AFTER INGESTION ARE OFTEN ASSOCIATED WITH TOXIC REACTIONS.   . WBC 03/23/2015 9.6  3.8 - 10.6 K/uL Final  . RBC 03/23/2015 4.61  4.40 - 5.90 MIL/uL Final  . Hemoglobin 03/23/2015 14.0  13.0 - 18.0 g/dL Final  . HCT 03/23/2015 41.1  40.0 - 52.0 % Final  . MCV 03/23/2015 89.2  80.0 - 100.0 fL Final  . MCH 03/23/2015 30.3  26.0 - 34.0 pg Final  . MCHC 03/23/2015 33.9  32.0 - 36.0 g/dL Final  . RDW 03/23/2015 14.7* 11.5 - 14.5 % Final  . Platelets 03/23/2015 412  150 - 440 K/uL Final  . Tricyclic, Ur Screen 51/70/0174 NONE DETECTED  NONE DETECTED Final  . Amphetamines, Ur Screen 03/23/2015 NONE DETECTED  NONE DETECTED Final  . MDMA (Ecstasy)Ur Screen 03/23/2015 NONE DETECTED  NONE DETECTED Final  . Cocaine Metabolite,Ur [place] 03/23/2015 NONE DETECTED  NONE DETECTED Final  . Opiate, Ur Screen 03/23/2015 NONE DETECTED  NONE DETECTED Final  . Phencyclidine (PCP) Ur S 03/23/2015 NONE DETECTED  NONE DETECTED Final  . Cannabinoid 50 Ng, Ur [place] 03/23/2015 NONE DETECTED  NONE DETECTED Final  . Barbiturates, Ur Screen 03/23/2015 NONE DETECTED  NONE DETECTED Final  . Benzodiazepine, Ur Scrn 03/23/2015 NONE DETECTED  NONE DETECTED Final  . Methadone Scn, Ur 03/23/2015 NONE DETECTED  NONE DETECTED Final   Comment: (NOTE) 944  Tricyclics, urine               Cutoff 1000 ng/mL 200   Amphetamines, urine  Cutoff 1000 ng/mL 300  MDMA (Ecstasy), urine           Cutoff 500 ng/mL 400  Cocaine Metabolite, urine       Cutoff 300 ng/mL 500  Opiate, urine                   Cutoff 300 ng/mL 600  Phencyclidine (PCP), urine      Cutoff 25 ng/mL 700  Cannabinoid, urine              Cutoff 50 ng/mL 800  Barbiturates, urine             Cutoff 200 ng/mL 900  Benzodiazepine, urine           Cutoff 200 ng/mL 1000 Methadone, urine                Cutoff 300 ng/mL 1100 1200 The urine drug screen provides only a preliminary, unconfirmed 1300 analytical test result and should not be used for non-medical 1400 purposes. Clinical consideration and professional judgment should 1500 be applied to any positive drug screen result due to possible 1600 interfering substances. A more specific alternate chemical method 1700 must be used in order to obtain a confirmed analytical result.  1800 Gas chromato                          graphy / mass spectrometry (GC/MS) is the preferred 1900 confirmatory method.   . Color, Urine 03/23/2015 YELLOW* YELLOW Final  . APPearance 03/23/2015 CLEAR* CLEAR Final  . Glucose, UA 03/23/2015 NEGATIVE  NEGATIVE mg/dL Final  . Bilirubin Urine 03/23/2015 NEGATIVE  NEGATIVE Final  . Ketones, ur 03/23/2015 NEGATIVE  NEGATIVE mg/dL Final  . Specific Gravity, Urine 03/23/2015 1.019  1.005 - 1.030 Final  . Hgb urine dipstick 03/23/2015 NEGATIVE  NEGATIVE Final  . pH 03/23/2015 5.0  5.0 - 8.0 Final  . Protein, ur 03/23/2015 NEGATIVE  NEGATIVE mg/dL Final  . Nitrite 03/23/2015 NEGATIVE  NEGATIVE Final  . Leukocytes, UA 03/23/2015 NEGATIVE  NEGATIVE Final  . RBC / HPF 03/23/2015 0-5  0 - 5 RBC/hpf Final  . WBC, UA 03/23/2015 0-5  0 - 5 WBC/hpf Final  . Bacteria, UA 03/23/2015 NONE SEEN  NONE SEEN Final  . Squamous Epithelial / LPF 03/23/2015 NONE SEEN  NONE SEEN Final  . Mucous 03/23/2015 PRESENT   Final   Lab Results  Component Value Date   KPAFRELGTCHN 13.40  03/14/2015   LAMBDASER 20.18 03/14/2015   KAPLAMBRATIO 0.66 03/14/2015    Impression Waldenstrm's macroglobulinemia in remission Emergency room  ecord had been reviewed. Psychiatric evaluation has been reviewed Patient underwent all drug testing which was negative  MRI scan of brain as well as lumbosacral spine has been reviewed. I do not believe that any of patient's symptoms are related to Waldenstrom macroglobulinemia.  And no further therapy is recommended at present time Patient was encouraged to [id number]  Patient was also encouraged to keep appointment with outpatient psychiatric evaluation. Reevaluate patient in 3 months    Patient expressed understanding and was in agreement with this plan. He also understands that He can call clinic at any time with any questions, concerns, or complaints.      Forest Gleason, MD   04/02/2015 8:45 AM

## 2015-04-29 ENCOUNTER — Inpatient Hospital Stay: Payer: BLUE CROSS/BLUE SHIELD

## 2015-04-29 ENCOUNTER — Ambulatory Visit: Payer: 59 | Admitting: Oncology

## 2015-04-29 ENCOUNTER — Inpatient Hospital Stay: Payer: BLUE CROSS/BLUE SHIELD | Attending: Oncology | Admitting: Oncology

## 2015-04-29 ENCOUNTER — Encounter: Payer: Self-pay | Admitting: Oncology

## 2015-04-29 ENCOUNTER — Other Ambulatory Visit: Payer: 59

## 2015-04-29 VITALS — BP 114/71 | HR 82 | Temp 96.0°F | Resp 18 | Wt 158.5 lb

## 2015-04-29 DIAGNOSIS — R42 Dizziness and giddiness: Secondary | ICD-10-CM | POA: Diagnosis not present

## 2015-04-29 DIAGNOSIS — K219 Gastro-esophageal reflux disease without esophagitis: Secondary | ICD-10-CM | POA: Diagnosis not present

## 2015-04-29 DIAGNOSIS — C880 Waldenstrom macroglobulinemia: Secondary | ICD-10-CM

## 2015-04-29 DIAGNOSIS — Z79899 Other long term (current) drug therapy: Secondary | ICD-10-CM

## 2015-04-29 DIAGNOSIS — G4730 Sleep apnea, unspecified: Secondary | ICD-10-CM | POA: Insufficient documentation

## 2015-04-29 DIAGNOSIS — Z87891 Personal history of nicotine dependence: Secondary | ICD-10-CM | POA: Insufficient documentation

## 2015-04-29 DIAGNOSIS — F329 Major depressive disorder, single episode, unspecified: Secondary | ICD-10-CM | POA: Insufficient documentation

## 2015-04-29 LAB — CBC WITH DIFFERENTIAL/PLATELET
BASOS ABS: 0 10*3/uL (ref 0–0.1)
Basophils Relative: 1 %
EOS ABS: 0.1 10*3/uL (ref 0–0.7)
EOS PCT: 1 %
HCT: 38.8 % — ABNORMAL LOW (ref 40.0–52.0)
Hemoglobin: 13.4 g/dL (ref 13.0–18.0)
Lymphocytes Relative: 23 %
Lymphs Abs: 1.3 10*3/uL (ref 1.0–3.6)
MCH: 30.1 pg (ref 26.0–34.0)
MCHC: 34.5 g/dL (ref 32.0–36.0)
MCV: 87.3 fL (ref 80.0–100.0)
MONO ABS: 0.6 10*3/uL (ref 0.2–1.0)
Monocytes Relative: 11 %
Neutro Abs: 3.9 10*3/uL (ref 1.4–6.5)
Neutrophils Relative %: 64 %
Platelets: 323 10*3/uL (ref 150–440)
RBC: 4.44 MIL/uL (ref 4.40–5.90)
RDW: 14.6 % — AB (ref 11.5–14.5)
WBC: 6 10*3/uL (ref 3.8–10.6)

## 2015-04-29 LAB — COMPREHENSIVE METABOLIC PANEL
ALT: 15 U/L — AB (ref 17–63)
AST: 21 U/L (ref 15–41)
Albumin: 4.2 g/dL (ref 3.5–5.0)
Alkaline Phosphatase: 75 U/L (ref 38–126)
Anion gap: 7 (ref 5–15)
BILIRUBIN TOTAL: 0.9 mg/dL (ref 0.3–1.2)
BUN: 19 mg/dL (ref 6–20)
CALCIUM: 9 mg/dL (ref 8.9–10.3)
CO2: 28 mmol/L (ref 22–32)
CREATININE: 0.99 mg/dL (ref 0.61–1.24)
Chloride: 100 mmol/L — ABNORMAL LOW (ref 101–111)
GFR calc Af Amer: >60 mL/min (ref >60)
Glucose, Bld: 90 mg/dL (ref 65–99)
Potassium: 4 mmol/L (ref 3.5–5.1)
Sodium: 135 mmol/L (ref 135–145)
TOTAL PROTEIN: 7.2 g/dL (ref 6.5–8.1)

## 2015-04-29 NOTE — Addendum Note (Signed)
Addended by: Livia Snellen on: 04/29/2015 04:02 PM   Modules accepted: Orders

## 2015-04-29 NOTE — Progress Notes (Signed)
[name] [phone]) [phone]  Fax:(336) [phone]     [name] OB: 1950/06/26  MR#: [id number]  WEX#:[id number]  Patient Care Team: Harold Pink, MD as PCP - General (Family Medicine) Harold Essex, MD (Gastroenterology)  CHIEF COMPLAINT:  Chief Complaint  Patient presents with  . Waldrenstrom's macroglobulinemia   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1,Waldenstrm's macroglobulinemi diagnosis in February of 2012 treated with induction  rituximab therapy  weekly x4 followed by a maintenancerituximab every 2 months   for 2 years , last dose was in March of 2014. 2,n June of 2015 patient was admitted in Hospital with dizziness and vertigo.  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  Patient was referred to [facility] for plasmapheresis and had undergone one plasmapheresis with significant improvement in symptoms.  Patient is here for further followup and treatment consideration. 3.started on rituximab 375 mg per meter squared weekly x4  Finished in July of 2015 4.pulmonary nodule was PET negative (November, 2015) Patient was started on  ibrutanib  but with poor tolerance so discontinued in November of 2015 5.patient was started on maintenance Rituxan therapy, April 18, 2014 6.  Because of crackles symptomatic relief rituximab was discontinued in November of 2016  No history exists.    Oncology Flowsheet 04/25/2012 07/01/2012 08/20/2014 10/22/2014 12/24/2014 03/14/2015 03/23/2015  dexamethasone (DECADRON) IV - - - - - 10 mg -  diazepam (VALIUM) IV - - - - - - 1 mg  diphenhydrAMINE (BENADRYL) PO 50 mg 50 mg 50 mg 50 mg 50 mg - -  ondansetron (ZOFRAN) IJ - - - - - - -  ondansetron (ZOFRAN) IV - - - - - - 4 mg  riTUXimab (RITUXAN) IV 375 mg/m2 375 mg/m2 375 mg/m2 375 mg/m2 375 mg/m2 - -    INTERVAL HISTORY:  65 year old gentleman came today as an acute add-on.  I received a phone call from patient's wife  that he has been not feeling good for last few days.'s morning if started feeling dizzy.   Patient is here for ongoing evaluation.  Feeling much better.  Occasional dizziness continues no nausea no vomiting no diarrhea appetite has been fairly good no bony pains. Marland Kitchen REVIEW OF SYSTEMS:    general status: Patient is feeling weak and tired.  No change in a performance status.  No chills.  Low-grade fever HEENT: Alopecia.  No evidence of stomatitis Lungs: Cough with expectoration.  Low-grade fever. Cardiac: No chest pain or paroxysmal nocturnal dyspnea GI: No nausea no vomiting no diarrhea no abdominal pain Skin: No rash Lower extremity no swelling Neurological system: No tingling.  No numbness.  No other focal signs Musculoskeletal system no bony pains .  As per HPI. Otherwise, a complete review of systems is negatve.  PAST MEDICAL HISTORY: Past Medical History  Diagnosis Date  . Anemia     prev bone marrow bx 02/11/10  . GERD (gastroesophageal reflux disease)   . Colitis 2004  . Anxiety   . 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[place])   . Sleep apnea 09-12-10    Uses CPAP  . Depression   . Need for prophylactic vaccination and inoculation against influenza 01/31/2013  . Bone cancer ([place])     PAST SURGICAL HISTORY: Past Surgical History  Procedure Laterality Date  . Nasal fracture surgery    . Colonoscopy  03/17/2011    Procedure: COLONOSCOPY;  Surgeon: Harold Columbia, MD;  Location: [facility] ENDOSCOPY;  Service: Endoscopy;  Laterality: N/A;  .  Hip fracture surgery  January 2013    R hip    FAMILY HISTORy No significant family history     ADVANCED DIRECTIVES:   Patient does have advance healthcare directive, Patient   does not desire to make any chanGE               HEALTH MAINTENANCE: Social History  Substance Use Topics  . Smoking status: Former Smoker    Quit date: 04/14/2007  . Smokeless tobacco: Never Used  . Alcohol Use: No      Allergies  Allergen Reactions  .  Ibrutinib Nausea Only    Current Outpatient Prescriptions  Medication Sig Dispense Refill  . clonazePAM (KLONOPIN) 0.5 MG tablet Take 1 tablet (0.5 mg total) by mouth 3 (three) times daily as needed for anxiety. 30 tablet 3  . dexlansoprazole (DEXILANT) 60 MG capsule Take 60 mg by mouth daily.      . meclizine (ANTIVERT) 25 MG tablet Take 1 tablet (25 mg total) by mouth 3 (three) times daily as needed for dizziness or nausea. 20 tablet 0  . ondansetron (ZOFRAN ODT) 4 MG disintegrating tablet Take 1 tablet (4 mg total) by mouth every 8 (eight) hours as needed for nausea or vomiting. 20 tablet 0  . PARoxetine (PAXIL) 40 MG tablet Take 40 mg by mouth every morning.    . Vitamin D, Cholecalciferol, 1000 UNITS TABS Take by mouth.     No current facility-administered medications for this visit.    OBJECTIVE:  Filed Vitals:   04/29/15 1514  BP: 114/71  Pulse: 82  Temp: 96 F (35.6 C)  Resp: 18     Body mass index is 22.12 kg/(m^2).    ECOG FS:1 - Symptomatic but completely ambulatory  PHYSICAL EXAM: GENERAL:  Well developed, well nourished, sitting comfortably in the exam room in no acute distress. MENTAL STATUS:  Alert and oriented to person, place and time. HEAD:  Normocephalic, atraumatic, face symmetric, no Cushingoid features. EYES:  eyes.  Pupils equal round and reactive to light and accomodation.  No conjunctivitis or scleral icterus. ENT:  Oropharynx clear without lesion.  Tongue normal. Mucous membranes moist.  RESPIRATORY:  Clear to auscultation without rales, wheezes or rhonchi. CARDIOVASCULAR:  Regular rate and rhythm without murmur, rub or gallop.  ABDOMEN:  Soft, non-tender, with active bowel sounds, and no hepatosplenomegaly.  No masses.  Tenderness in the lower abdominal area BACK:  No CVA tenderness.  No tenderness on percussion of the back or rib cage. SKIN:  No rashes, ulcers or lesions. EXTREMITIES: No edema, no skin discoloration or tenderness.  No palpable  cords. LYMPH NODES: No palpable cervical, supraclavicular, axillary or inguinal adenopathy  NEUROLOGICAL: Unremarkable. PSYCH:  Appropriate.  GU: Improved swelling in and pain in the scrotal area LAB RESULTS:  Appointment on 04/29/2015  Component Date Value Ref Range Status  . WBC 04/29/2015 6.0  3.8 - 10.6 K/uL Final  . RBC 04/29/2015 4.44  4.40 - 5.90 MIL/uL Final  . Hemoglobin 04/29/2015 13.4  13.0 - 18.0 g/dL Final  . HCT 04/29/2015 38.8* 40.0 - 52.0 % Final  . MCV 04/29/2015 87.3  80.0 - 100.0 fL Final  . MCH 04/29/2015 30.1  26.0 - 34.0 pg Final  . MCHC 04/29/2015 34.5  32.0 - 36.0 g/dL Final  . RDW 04/29/2015 14.6* 11.5 - 14.5 % Final  . Platelets 04/29/2015 323  150 - 440 K/uL Final  . Neutrophils Relative % 04/29/2015 64   Final  . Neutro  Abs 04/29/2015 3.9  1.4 - 6.5 K/uL Final  . Lymphocytes Relative 04/29/2015 23   Final  . Lymphs Abs 04/29/2015 1.3  1.0 - 3.6 K/uL Final  . Monocytes Relative 04/29/2015 11   Final  . Monocytes Absolute 04/29/2015 0.6  0.2 - 1.0 K/uL Final  . Eosinophils Relative 04/29/2015 1   Final  . Eosinophils Absolute 04/29/2015 0.1  0 - 0.7 K/uL Final  . Basophils Relative 04/29/2015 1   Final  . Basophils Absolute 04/29/2015 0.0  0 - 0.1 K/uL Final  . Sodium 04/29/2015 135  135 - 145 mmol/L Final  . Potassium 04/29/2015 4.0  3.5 - 5.1 mmol/L Final  . Chloride 04/29/2015 100* 101 - 111 mmol/L Final  . CO2 04/29/2015 28  22 - 32 mmol/L Final  . Glucose, Bld 04/29/2015 90  65 - 99 mg/dL Final  . BUN 04/29/2015 19  6 - 20 mg/dL Final  . Creatinine, Ser 04/29/2015 0.99  0.61 - 1.24 mg/dL Final  . Calcium 04/29/2015 9.0  8.9 - 10.3 mg/dL Final  . Total Protein 04/29/2015 7.2  6.5 - 8.1 g/dL Final  . Albumin 04/29/2015 4.2  3.5 - 5.0 g/dL Final  . AST 04/29/2015 21  15 - 41 U/L Final  . ALT 04/29/2015 15* 17 - 63 U/L Final  . Alkaline Phosphatase 04/29/2015 75  38 - 126 U/L Final  . Total Bilirubin 04/29/2015 0.9  0.3 - 1.2 mg/dL Final  . GFR  calc non Af Amer 04/29/2015 >60  >60 mL/min Final  . GFR calc Af Amer 04/29/2015 >60  >60 mL/min Final   Comment: (NOTE) The eGFR has been calculated using the CKD EPI equation. This calculation has not been validated in all clinical situations. eGFR's persistently <60 mL/min signify possible Chronic Kidney Disease.   . Anion gap 04/29/2015 7  5 - 15 Final   Lab Results  Component Value Date   KPAFRELGTCHN 13.40 03/14/2015   LAMBDASER 20.18 03/14/2015   KAPLAMBRATIO 0.66 03/14/2015    Impression Waldenstrm's macroglobulinemia in remission    So far Patient is feeling better. All lab data has been reviewed.  Hemoglobin is stable.  Reevaluate patient in 4-6 weeks with SIEP and myeloma panel and light chain Long as IgM remains stable no further treatment is recommended    Patient expressed understanding and was in agreement with this plan. He also understands that He can call clinic at any time with any questions, concerns, or complaints.      Forest Gleason, MD   04/29/2015 3:52 PM

## 2015-04-30 LAB — KAPPA/LAMBDA LIGHT CHAINS
Kappa free light chain: 12.77 mg/L (ref 3.30–19.40)
Kappa, lambda light chain ratio: 0.67 (ref 0.26–1.65)
Lambda free light chains: 19.11 mg/L (ref 5.71–26.30)

## 2015-04-30 LAB — MULTIPLE MYELOMA PANEL, SERUM
ALBUMIN/GLOB SERPL: 1.4 (ref 0.7–1.7)
ALPHA 1: 0.2 g/dL (ref 0.0–0.4)
ALPHA2 GLOB SERPL ELPH-MCNC: 0.8 g/dL (ref 0.4–1.0)
Albumin SerPl Elph-Mcnc: 3.7 g/dL (ref 2.9–4.4)
B-Globulin SerPl Elph-Mcnc: 0.9 g/dL (ref 0.7–1.3)
GAMMA GLOB SERPL ELPH-MCNC: 0.8 g/dL (ref 0.4–1.8)
Globulin, Total: 2.7 g/dL (ref 2.2–3.9)
IGG (IMMUNOGLOBIN G), SERUM: 463 mg/dL — AB (ref 700–1600)
IgA: 103 mg/dL (ref 61–437)
IgM, Serum: 482 mg/dL — ABNORMAL HIGH (ref 20–172)
M PROTEIN SERPL ELPH-MCNC: 0.4 g/dL — AB
TOTAL PROTEIN ELP: 6.4 g/dL (ref 6.0–8.5)

## 2015-08-01 ENCOUNTER — Inpatient Hospital Stay: Payer: BLUE CROSS/BLUE SHIELD | Admitting: Oncology

## 2015-08-01 ENCOUNTER — Inpatient Hospital Stay: Payer: BLUE CROSS/BLUE SHIELD

## 2015-09-11 ENCOUNTER — Inpatient Hospital Stay (HOSPITAL_BASED_OUTPATIENT_CLINIC_OR_DEPARTMENT_OTHER): Payer: BLUE CROSS/BLUE SHIELD | Admitting: Family Medicine

## 2015-09-11 ENCOUNTER — Other Ambulatory Visit: Payer: BLUE CROSS/BLUE SHIELD

## 2015-09-11 ENCOUNTER — Ambulatory Visit: Payer: BLUE CROSS/BLUE SHIELD | Admitting: Oncology

## 2015-09-11 ENCOUNTER — Inpatient Hospital Stay: Payer: BLUE CROSS/BLUE SHIELD | Attending: Oncology

## 2015-09-11 VITALS — BP 127/82 | HR 83 | Temp 96.4°F | Resp 17 | Ht 71.0 in | Wt 161.4 lb

## 2015-09-11 DIAGNOSIS — F329 Major depressive disorder, single episode, unspecified: Secondary | ICD-10-CM | POA: Insufficient documentation

## 2015-09-11 DIAGNOSIS — C880 Waldenstrom macroglobulinemia: Secondary | ICD-10-CM

## 2015-09-11 DIAGNOSIS — Z87891 Personal history of nicotine dependence: Secondary | ICD-10-CM | POA: Insufficient documentation

## 2015-09-11 DIAGNOSIS — K219 Gastro-esophageal reflux disease without esophagitis: Secondary | ICD-10-CM | POA: Diagnosis not present

## 2015-09-11 DIAGNOSIS — Z79899 Other long term (current) drug therapy: Secondary | ICD-10-CM

## 2015-09-11 DIAGNOSIS — F419 Anxiety disorder, unspecified: Secondary | ICD-10-CM | POA: Insufficient documentation

## 2015-09-11 LAB — CBC WITH DIFFERENTIAL/PLATELET
BASOS ABS: 0 10*3/uL (ref 0–0.1)
BASOS PCT: 1 %
EOS ABS: 0.1 10*3/uL (ref 0–0.7)
EOS PCT: 1 %
HCT: 39.4 % — ABNORMAL LOW (ref 40.0–52.0)
Hemoglobin: 13.7 g/dL (ref 13.0–18.0)
LYMPHS PCT: 22 %
Lymphs Abs: 1.5 10*3/uL (ref 1.0–3.6)
MCH: 30.3 pg (ref 26.0–34.0)
MCHC: 34.7 g/dL (ref 32.0–36.0)
MCV: 87.4 fL (ref 80.0–100.0)
MONO ABS: 0.6 10*3/uL (ref 0.2–1.0)
Monocytes Relative: 10 %
Neutro Abs: 4.4 10*3/uL (ref 1.4–6.5)
Neutrophils Relative %: 66 %
PLATELETS: 351 10*3/uL (ref 150–440)
RBC: 4.51 MIL/uL (ref 4.40–5.90)
RDW: 14.9 % — AB (ref 11.5–14.5)
WBC: 6.6 10*3/uL (ref 3.8–10.6)

## 2015-09-11 LAB — COMPREHENSIVE METABOLIC PANEL
ALBUMIN: 4.3 g/dL (ref 3.5–5.0)
ALT: 28 U/L (ref 17–63)
AST: 30 U/L (ref 15–41)
Alkaline Phosphatase: 82 U/L (ref 38–126)
Anion gap: 7 (ref 5–15)
BUN: 25 mg/dL — AB (ref 6–20)
CHLORIDE: 104 mmol/L (ref 101–111)
CO2: 28 mmol/L (ref 22–32)
CREATININE: 0.98 mg/dL (ref 0.61–1.24)
Calcium: 8.9 mg/dL (ref 8.9–10.3)
GFR calc Af Amer: >60 mL/min (ref >60)
Glucose, Bld: 99 mg/dL (ref 65–99)
POTASSIUM: 4 mmol/L (ref 3.5–5.1)
SODIUM: 139 mmol/L (ref 135–145)
Total Bilirubin: 1 mg/dL (ref 0.3–1.2)
Total Protein: 7.3 g/dL (ref 6.5–8.1)

## 2015-09-11 NOTE — Progress Notes (Signed)
Pt reports lower leg pain bilateral aching that has started recently for about 3 months right being worse than left and upper thigh currently worse.Marland Kitchen  Pt reports moderate fatigue, pain in fingers, being hot a lot with sweats with no fevers, nausea at times, and being dizzy.  Orthostatics taken.  Pt reports he had replacement 2013.  Pt reports fall last week in house tripped over something that has been in same place.  Fall bracelet placed .

## 2015-09-11 NOTE — Progress Notes (Signed)
[place]  [phone]  [phone]     CLEAVE TERNES DOB: 01-17-1951  MR#: [id number]  RFF#:[id number]  Patient Care Team: [place] Pink, MD as PCP - General (Family Medicine) Clarene Essex, MD (Gastroenterology)  CHIEF COMPLAINT:  Chief Complaint  Patient presents with  . 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 Patient underwent multiple cycles of rituximab. His last treatment was discontinued in November 2016. Patient reports that he has gone "downhill" since. Patient reports that lower leg pain is bilateral and aching and started approximately 6 months ago but has worsened in the last 3 months with the right being worse than the left. He states that he sometimes has to lift his on leg about his pants in order to move his right leg. He also reports moderate fatigue with pain in the small joints of the fingers as well as hot flashes and episodes of diaphoresis and confusion.  REVIEW OF SYSTEMS:   Review of Systems  Constitutional: Positive for malaise/fatigue and diaphoresis. Negative for fever, chills and weight loss.  HENT: Negative.   Eyes: Negative.   Respiratory: Negative for cough, hemoptysis, sputum production, shortness of breath and wheezing.   Cardiovascular: Negative for chest pain, palpitations, orthopnea, claudication, leg swelling and PND.  Gastrointestinal: Negative for heartburn, nausea, vomiting, abdominal pain, diarrhea, constipation, blood in stool and melena.  Genitourinary: Negative.   Musculoskeletal: Positive for myalgias, back pain and joint pain.  Skin: Negative.   Neurological: Positive for dizziness, focal weakness and weakness. Negative for tingling and seizures.  Endo/Heme/Allergies: Does not bruise/bleed easily.  Psychiatric/Behavioral: Negative for depression. The  patient is not nervous/anxious and does not have insomnia.     As per HPI. Otherwise, a complete review of systems is negatve.  1. Waldenstrm's macroglobulinemi diagnosis in February of 2012 treated with induction rituximab therapy weekly x4 followed by a maintenancerituximab every 2 months for 2 years , last dose was in March of 2014. 2. June of 2015 patient was admitted in Hospital with dizziness and vertigo. 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 Patient was referred to [facility] for plasmapheresis and had undergone one plasmapheresis with significant improvement in symptoms. Patient is here for further followup and treatment consideration. 3. Started on rituximab 375 mg per meter squared weekly x4 Finished in July of 2015 4.Pulmonary nodule was PET negative (November, 2015) 5. Patient was started on ibrutanib but with poor tolerance so discontinued in November of 2015 6. Patient was started on maintenance Rituxan therapy, April 18, 2014 7. Rituxan was discontinued November 2016.   PAST MEDICAL HISTORY: Past Medical History  Diagnosis Date  . Anemia     prev bone marrow bx 02/11/10  . GERD (gastroesophageal reflux disease)   . Colitis 2004  . Anxiety   . 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[place])   . Sleep apnea 09-12-10    Uses CPAP  . Depression   . Need for prophylactic vaccination and inoculation against influenza 01/31/2013  . Bone cancer ([place])     PAST SURGICAL HISTORY: Past Surgical History  Procedure Laterality Date  . Nasal fracture surgery    . Colonoscopy  03/17/2011    Procedure: COLONOSCOPY;  Surgeon: Jeryl Columbia, MD;  Location: [facility];  Service: Endoscopy;  Laterality: N/A;  . Hip fracture surgery  January 2013  R hip    FAMILY HISTORY No family history on file.  GYNECOLOGIC HISTORY:  No LMP for male patient.     ADVANCED DIRECTIVES:    HEALTH  MAINTENANCE: Social History  Substance Use Topics  . Smoking status: Former Smoker    Quit date: 04/14/2007  . Smokeless tobacco: Never Used  . Alcohol Use: No      Allergies  Allergen Reactions  . Ibrutinib Nausea Only    Current Outpatient Prescriptions  Medication Sig Dispense Refill  . clonazePAM (KLONOPIN) 0.5 MG tablet Take 1 tablet (0.5 mg total) by mouth 3 (three) times daily as needed for anxiety. 30 tablet 3  . dexlansoprazole (DEXILANT) 60 MG capsule Take 60 mg by mouth daily.      . meclizine (ANTIVERT) 25 MG tablet Take 1 tablet (25 mg total) by mouth 3 (three) times daily as needed for dizziness or nausea. 20 tablet 0  . ondansetron (ZOFRAN ODT) 4 MG disintegrating tablet Take 1 tablet (4 mg total) by mouth every 8 (eight) hours as needed for nausea or vomiting. 20 tablet 0  . PARoxetine (PAXIL) 40 MG tablet Take 40 mg by mouth every morning.    . Vitamin D, Cholecalciferol, 1000 UNITS TABS Take by mouth.     No current facility-administered medications for this visit.    OBJECTIVE: BP 127/82 mmHg  Pulse 83  Temp(Src) 96.4 F (35.8 C) (Tympanic)  Resp 17  Ht '5\' 11"'$  (1.803 m)  Wt 161 lb 6 oz (73.2 kg)  BMI 22.52 kg/m2   Body mass index is 22.52 kg/(m^2).    ECOG FS:1 - Symptomatic but completely ambulatory  General: Well-developed, well-nourished, no acute distress. Eyes: Pink conjunctiva, anicteric sclera. HEENT: Normocephalic, moist mucous membranes, clear oropharnyx. Lungs: Clear to auscultation bilaterally. Heart: Regular rate and rhythm. No rubs, murmurs, or gallops. Abdomen: Soft, nontender, nondistended. No organomegaly noted, normoactive bowel sounds. Musculoskeletal: No edema, cyanosis, or clubbing. Neuro: Alert, answering all questions appropriately. Cranial nerves grossly intact. Patient having pain shooting from lumbar spine in 2 right and left leg right greater than left. Skin: No rashes or petechiae noted. Psych: Normal affect. Lymphatics: No  cervical, clavicular LAD.   LAB RESULTS:  Appointment on 09/11/2015  Component Date Value Ref Range Status  . WBC 09/11/2015 6.6  3.8 - 10.6 K/uL Final  . RBC 09/11/2015 4.51  4.40 - 5.90 MIL/uL Final  . Hemoglobin 09/11/2015 13.7  13.0 - 18.0 g/dL Final  . HCT 09/11/2015 39.4* 40.0 - 52.0 % Final  . MCV 09/11/2015 87.4  80.0 - 100.0 fL Final  . MCH 09/11/2015 30.3  26.0 - 34.0 pg Final  . MCHC 09/11/2015 34.7  32.0 - 36.0 g/dL Final  . RDW 09/11/2015 14.9* 11.5 - 14.5 % Final  . Platelets 09/11/2015 351  150 - 440 K/uL Final  . Neutrophils Relative % 09/11/2015 66   Final  . Neutro Abs 09/11/2015 4.4  1.4 - 6.5 K/uL Final  . Lymphocytes Relative 09/11/2015 22   Final  . Lymphs Abs 09/11/2015 1.5  1.0 - 3.6 K/uL Final  . Monocytes Relative 09/11/2015 10   Final  . Monocytes Absolute 09/11/2015 0.6  0.2 - 1.0 K/uL Final  . Eosinophils Relative 09/11/2015 1   Final  . Eosinophils Absolute 09/11/2015 0.1  0 - 0.7 K/uL Final  . Basophils Relative 09/11/2015 1   Final  . Basophils Absolute 09/11/2015 0.0  0 - 0.1 K/uL Final  . Sodium 09/11/2015 139  135 -  145 mmol/L Final  . Potassium 09/11/2015 4.0  3.5 - 5.1 mmol/L Final  . Chloride 09/11/2015 104  101 - 111 mmol/L Final  . CO2 09/11/2015 28  22 - 32 mmol/L Final  . Glucose, Bld 09/11/2015 99  65 - 99 mg/dL Final  . BUN 09/11/2015 25* 6 - 20 mg/dL Final  . Creatinine, Ser 09/11/2015 0.98  0.61 - 1.24 mg/dL Final  . Calcium 09/11/2015 8.9  8.9 - 10.3 mg/dL Final  . Total Protein 09/11/2015 7.3  6.5 - 8.1 g/dL Final  . Albumin 09/11/2015 4.3  3.5 - 5.0 g/dL Final  . AST 09/11/2015 30  15 - 41 U/L Final  . ALT 09/11/2015 28  17 - 63 U/L Final  . Alkaline Phosphatase 09/11/2015 82  38 - 126 U/L Final  . Total Bilirubin 09/11/2015 1.0  0.3 - 1.2 mg/dL Final  . GFR calc non Af Amer 09/11/2015 >60  >60 mL/min Final  . GFR calc Af Amer 09/11/2015 >60  >60 mL/min Final   Comment: (NOTE) The eGFR has been calculated using the CKD EPI  equation. This calculation has not been validated in all clinical situations. eGFR's persistently <60 mL/min signify possible Chronic Kidney Disease.   . Anion gap 09/11/2015 7  5 - 15 Final    STUDIES: No results found.  ASSESSMENT:  Waldenstrm's macroglobulinemia.  PLAN:   1. Waldenstrm's macroglobulinemia. Clinically the patient appears to be in remission. Patient does have a pending multiple myeloma panel as well as free light chains. Patient is fairly adamant that he would like to restart treatment if at all possible as he feels that his bilateral lower extremity weakness and pain is related to progressing disease. After discussion with Dr. Oliva Bustard, it has been explained to patient as well as his IgM levels remain stable that no further treatment would be recommended. We will have to await these results from today. Patient was instructed that I would call him with follow-up appointment recommendations as well as results from his labs.  Patient expressed understanding and was in agreement with this plan. He also understands that He can call clinic at any time with any questions, concerns, or complaints.   Dr. Oliva Bustard was available for consultation and review of plan of care for this patient.   Evlyn Kanner, NP   09/11/2015 4:03 PM

## 2015-09-12 LAB — KAPPA/LAMBDA LIGHT CHAINS
KAPPA FREE LGHT CHN: 15.62 mg/L (ref 3.30–19.40)
KAPPA, LAMDA LIGHT CHAIN RATIO: 0.82 (ref 0.26–1.65)
Lambda free light chains: 19.02 mg/L (ref 5.71–26.30)

## 2015-09-13 LAB — MULTIPLE MYELOMA PANEL, SERUM
ALBUMIN SERPL ELPH-MCNC: 4.1 g/dL (ref 2.9–4.4)
ALPHA 1: 0.1 g/dL (ref 0.0–0.4)
ALPHA2 GLOB SERPL ELPH-MCNC: 0.6 g/dL (ref 0.4–1.0)
Albumin/Glob SerPl: 1.8 — ABNORMAL HIGH (ref 0.7–1.7)
B-GLOBULIN SERPL ELPH-MCNC: 0.9 g/dL (ref 0.7–1.3)
GAMMA GLOB SERPL ELPH-MCNC: 0.8 g/dL (ref 0.4–1.8)
Globulin, Total: 2.4 g/dL (ref 2.2–3.9)
IGG (IMMUNOGLOBIN G), SERUM: 528 mg/dL — AB (ref 700–1600)
IgA: 111 mg/dL (ref 61–437)
IgM, Serum: 522 mg/dL — ABNORMAL HIGH (ref 20–172)
M PROTEIN SERPL ELPH-MCNC: 0.3 g/dL — AB
TOTAL PROTEIN ELP: 6.5 g/dL (ref 6.0–8.5)

## 2015-09-16 ENCOUNTER — Telehealth: Payer: Self-pay | Admitting: *Deleted

## 2015-09-16 ENCOUNTER — Other Ambulatory Visit: Payer: Self-pay | Admitting: Oncology

## 2015-09-16 NOTE — Telephone Encounter (Signed)
Called asking for results of labs last week and asking who he will be seeing after Dr Oliva Bustard leaves and when. After discussing with L Herring, AGNP-C I returned call and explained that all his labs are stable and that we will see him back in 4 months to see Dr Mike Gip the schedulers will send appt. She then asked what we are going to do about him feeling so bad, does he have to wait 4 months to get anything done about that. I asked what symptoms he has and she stated that his legs hurt and he has nausea. I advised her to call his PCP, she asked what that is and I told her his family doctor.

## 2015-09-19 ENCOUNTER — Other Ambulatory Visit: Payer: Self-pay | Admitting: Oncology

## 2015-11-02 ENCOUNTER — Emergency Department: Payer: Medicare Other

## 2015-11-02 ENCOUNTER — Emergency Department
Admission: EM | Admit: 2015-11-02 | Discharge: 2015-11-02 | Disposition: A | Discharge status: Home / Self Care | Payer: Medicare Other | Attending: Emergency Medicine | Admitting: Emergency Medicine

## 2015-11-02 ENCOUNTER — Encounter: Payer: Self-pay | Admitting: Emergency Medicine

## 2015-11-02 DIAGNOSIS — Y999 Unspecified external cause status: Secondary | ICD-10-CM | POA: Insufficient documentation

## 2015-11-02 DIAGNOSIS — W450XXA Nail entering through skin, initial encounter: Secondary | ICD-10-CM | POA: Diagnosis not present

## 2015-11-02 DIAGNOSIS — S71131A Puncture wound without foreign body, right thigh, initial encounter: Secondary | ICD-10-CM | POA: Diagnosis not present

## 2015-11-02 DIAGNOSIS — Z85830 Personal history of malignant neoplasm of bone: Secondary | ICD-10-CM | POA: Diagnosis not present

## 2015-11-02 DIAGNOSIS — Z87891 Personal history of nicotine dependence: Secondary | ICD-10-CM | POA: Diagnosis not present

## 2015-11-02 DIAGNOSIS — M79651 Pain in right thigh: Secondary | ICD-10-CM | POA: Diagnosis present

## 2015-11-02 DIAGNOSIS — Y929 Unspecified place or not applicable: Secondary | ICD-10-CM | POA: Diagnosis not present

## 2015-11-02 DIAGNOSIS — Y939 Activity, unspecified: Secondary | ICD-10-CM | POA: Insufficient documentation

## 2015-11-02 DIAGNOSIS — Z79899 Other long term (current) drug therapy: Secondary | ICD-10-CM | POA: Diagnosis not present

## 2015-11-02 MED ORDER — TETANUS-DIPHTH-ACELL PERTUSSIS 5-2.5-18.5 LF-MCG/0.5 IM SUSP
0.5000 mL | Freq: Once | INTRAMUSCULAR | Status: AC
  Administered 2015-11-02: 0.5 mL via INTRAMUSCULAR
  Filled 2015-11-02: qty 0.5

## 2015-11-02 MED ORDER — BACITRACIN ZINC 500 UNIT/GM EX OINT
TOPICAL_OINTMENT | CUTANEOUS | Status: AC
  Filled 2015-11-02: qty 0.9

## 2015-11-02 MED ORDER — AMOXICILLIN-POT CLAVULANATE 875-125 MG PO TABS: 1.0000 | ORAL_TABLET | Freq: Two times a day (BID) | ORAL | Status: DC

## 2015-11-02 NOTE — ED Provider Notes (Signed)
[facility] Emergency Department Provider Note  ____________________________________________  Time seen: Approximately 4:35 PM  I have reviewed the triage vital signs and the nursing notes.   HISTORY  Chief Complaint Extremity Laceration and Fall   HPI [name] is a 65 y.o. male presents to the emergency department for evaluation of right thigh pain after falling on a board with nails protruding from it. He pulled the nail out, but feels that one of then penetrated through "to the bone." He is unsure of his last tetanus shot. He states that after the incident, he poured alcohol on the wounds. He has not taken any medications since the incident.  Past Medical History  Diagnosis Date  . Anemia     prev bone marrow bx 02/11/10  . GERD (gastroesophageal reflux disease)   . Colitis 2004  . Anxiety   . 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[place])   . Sleep apnea 09-12-10    Uses CPAP  . Depression   . Need for prophylactic vaccination and inoculation against influenza 01/31/2013  . Bone cancer [facility])     Patient Active Problem List   Diagnosis Date Noted  . Abnormal CAT scan 09/17/2013  . Need for prophylactic vaccination and inoculation against influenza 01/31/2013  . 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[place])   . GERD (gastroesophageal reflux disease)     Past Surgical History  Procedure Laterality Date  . Nasal fracture surgery    . Colonoscopy  03/17/2011    Procedure: COLONOSCOPY;  Surgeon: Jeryl Columbia, MD;  Location: [facility];  Service: Endoscopy;  Laterality: N/A;  . Hip fracture surgery  January 2013    R hip    Current Outpatient Rx  Name  Route  Sig  Dispense  Refill  . amoxicillin-clavulanate (AUGMENTIN) 875-125 MG tablet   Oral   Take 1 tablet by mouth 2 (two) times daily.   20 tablet   0   . clonazePAM (KLONOPIN) 0.5 MG tablet      TAKE 1 TABLET BY MOUTH 3 TIMES DAILY AS NEEDED FOR ANXIETY   30 tablet   1     Not to exceed 2  additional fills before 09/28/2015 ...   . dexlansoprazole (DEXILANT) 60 MG capsule   Oral   Take 60 mg by mouth daily.           . meclizine (ANTIVERT) 25 MG tablet   Oral   Take 1 tablet (25 mg total) by mouth 3 (three) times daily as needed for dizziness or nausea.   20 tablet   0   . ondansetron (ZOFRAN ODT) 4 MG disintegrating tablet   Oral   Take 1 tablet (4 mg total) by mouth every 8 (eight) hours as needed for nausea or vomiting.   20 tablet   0   . PARoxetine (PAXIL) 40 MG tablet   Oral   Take 40 mg by mouth every morning.         . Vitamin D, Cholecalciferol, 1000 UNITS TABS   Oral   Take by mouth.           Allergies Ibrutinib  No family history on file.  Social History Social History  Substance Use Topics  . Smoking status: Former Smoker    Quit date: 04/14/2007  . Smokeless tobacco: Never Used  . Alcohol Use: No    Review of Systems  Constitutional: Negative for fever/chills Respiratory: Negative for shortness of breath. Musculoskeletal: Positive for pain. Skin: Positive for puncture wound and superficial  abrasion. Neurological: Negative for headaches, focal weakness or numbness. ____________________________________________   PHYSICAL EXAM:  VITAL SIGNS: ED Triage Vitals  Enc Vitals Group     BP 11/02/15 1526 122/77 mmHg     Pulse Rate 11/02/15 1526 78     Resp 11/02/15 1526 18     Temp 11/02/15 1526 97.9 F (36.6 C)     Temp Source 11/02/15 1526 Oral     SpO2 11/02/15 1526 98 %     Weight 11/02/15 1526 160 lb (72.576 kg)     Height 11/02/15 1526 5\' 11"  (1.803 m)     Head Cir --      Peak Flow --      Pain Score 11/02/15 1531 0     Pain Loc --      Pain Edu? --      Excl. in [place]? --      Constitutional: Alert and oriented. Well appearing and in no acute distress. Eyes: Conjunctivae are normal. EOMI. Nose: No congestion/rhinnorhea. Mouth/Throat: Mucous membranes are moist.   Neck: No stridor. Cardiovascular: Good  peripheral circulation. Respiratory: Normal respiratory effort.  No retractions. Musculoskeletal: FROM throughout. Neurologic:  Normal speech and language. No gross focal neurologic deficits are appreciated. Skin:  Puncture wound noted to the anterior thigh as well as a superficial laceration that measures approximately 2 cm. Bleeding is well-controlled to both.  ____________________________________________   LABS (all labs ordered are listed, but only abnormal results are displayed)  Labs Reviewed - No data to display ____________________________________________  EKG   ____________________________________________  RADIOLOGY  Right femur: Negative for retained radiopaque foreign body or acute bony abnormality per radiology. ____________________________________________   PROCEDURES  Procedure(s) performed:   Puncture wound irrigated with Betadine and saline. Superficial laceration was also cleaned with Betadine and saline. Bacitracin was applied to all and Kerlix was used to cover the wounds. Wound care was completed by me, Sherrie George, FNP-BC. ____________________________________________   INITIAL IMPRESSION / ASSESSMENT AND PLAN / ED COURSE  Pertinent labs & imaging results that were available during my care of the patient were reviewed by me and considered in my medical decision making (see chart for details).  He will be advised to take the Augmentin as prescribed.  He was advised to follow up with his primary care provider for any sign of concern of infection.  He was also advised to return to the emergency department for symptoms that change or worsen if unable to schedule an appointment.  ____________________________________________   FINAL CLINICAL IMPRESSION(S) / ED DIAGNOSES  Final diagnoses:  Puncture wound of thigh, right, initial encounter    Discharge Medication List as of 11/02/2015  5:39 PM    START taking these medications   Details   amoxicillin-clavulanate (AUGMENTIN) 875-125 MG tablet Take 1 tablet by mouth 2 (two) times daily., Starting 11/02/2015, Until Discontinued, [place], FNP 11/02/15 1914  Nance Pear, MD 11/02/15 [phone]

## 2015-11-02 NOTE — ED Notes (Signed)
NAD noted at time of D/C. Pt denies questions or concerns. Pt ambulatory to the lobby at this time.  

## 2015-11-02 NOTE — ED Notes (Signed)
Pt presents to ED with reports of tripping while putting a donkey in a fence. Pt reports fell on nails. Pt presents with two small wounds to right thigh. Pt denies dizziness, lightheadedness or LOC. Pt states he does not know when his last tetanus shot was.

## 2015-11-02 NOTE — Discharge Instructions (Signed)
Puncture Wound A puncture wound is an injury that is caused by a sharp, thin object that goes through (penetrates) your skin. Usually, a puncture wound does not leave a large opening in your skin, so it may not bleed a lot. However, when you get a puncture wound, dirt or other materials (foreign bodies) can be forced into your wound and break off inside. This increases the chance of infection, such as tetanus. CAUSES Puncture wounds are caused by any sharp, thin object that goes through your skin, such as:  Animal teeth, as with an animal bite.  Sharp, pointed objects, such as nails, splinters of glass, fishhooks, and needles. SYMPTOMS Symptoms of a puncture wound include:  Pain.  Bleeding.  Swelling.  Bruising.  Fluid leaking from the wound.  Numbness, tingling, or loss of function. DIAGNOSIS This condition is diagnosed with a medical history and physical exam. Your wound will be checked to see if it contains any foreign bodies. You may also have X-rays or other imaging tests. TREATMENT Treatment for a puncture wound depends on how serious the wound is. It also depends on whether the wound contains any foreign bodies. Treatment for all types of puncture wounds usually starts with:  Controlling the bleeding.  Washing out the wound with a germ-free (sterile) salt-water solution.  Checking the wound for foreign bodies. Treatment may also include:  Having the wound opened surgically to remove a foreign object.  Closing the wound with stitches (sutures) if it continues to bleed.  Covering the wound with antibiotic ointments and a bandage (dressing).  Receiving a tetanus shot.  Receiving a rabies vaccine. HOME CARE INSTRUCTIONS Medicines  Take or apply over-the-counter and prescription medicines only as told by your health care provider.  If you were prescribed an antibiotic, take or apply it as told by your health care provider. Do not stop using the antibiotic even if  your condition improves. Wound Care  There are many ways to close and cover a wound. For example, a wound can be covered with sutures, skin glue, or adhesive strips. Follow instructions from your health care provider about:  How to take care of your wound.  When and how you should change your dressing.  When you should remove your dressing.  Removing whatever was used to close your wound.  Keep the dressing dry as told by your health care provider. Do not take baths, swim, use a hot tub, or do anything that would put your wound underwater until your health care provider approves.  Clean the wound as told by your health care provider.  Do not scratch or pick at the wound.  Check your wound every day for signs of infection. Watch for:  Redness, swelling, or pain.  Fluid, blood, or pus. General Instructions  Raise (elevate) the injured area above the level of your heart while you are sitting or lying down.  If your puncture wound is in your foot, ask your health care provider if you need to avoid putting weight on your foot and for how long.  Keep all follow-up visits as told by your health care provider. This is important. SEEK MEDICAL CARE IF:  You received a tetanus shot and you have swelling, severe pain, redness, or bleeding at the injection site.  You have a fever.  Your sutures come out.  You notice a bad smell coming from your wound or your dressing.  You notice something coming out of your wound, such as wood or glass.  Your   pain is not controlled with medicine.  You have increased redness, swelling, or pain at the site of your wound.  You have fluid, blood, or pus coming from your wound.  You notice a change in the color of your skin near your wound.  You need to change the dressing frequently due to fluid, blood, or pus draining from your wound.  You develop a new rash.  You develop numbness around your wound. SEEK IMMEDIATE MEDICAL CARE IF:  You  develop severe swelling around your wound.  Your pain suddenly increases and is severe.  You develop painful skin lumps.  You have a red streak going away from your wound.  The wound is on your hand or foot and you cannot properly move a finger or toe.  The wound is on your hand or foot and you notice that your fingers or toes look pale or bluish.   This information is not intended to replace advice given to you by your health care provider. Make sure you discuss any questions you have with your health care provider.   Document Released: 01/07/2005 Document Revised: 12/19/2014 Document Reviewed: 05/23/2014 Elsevier Interactive Patient Education 2016 [employer]  

## 2016-01-14 ENCOUNTER — Other Ambulatory Visit: Payer: Self-pay | Admitting: *Deleted

## 2016-01-14 DIAGNOSIS — C880 Waldenstrom macroglobulinemia: Secondary | ICD-10-CM

## 2016-01-16 ENCOUNTER — Inpatient Hospital Stay: Payer: Medicare Other | Attending: Hematology and Oncology

## 2016-01-16 ENCOUNTER — Encounter: Payer: Self-pay | Admitting: Hematology and Oncology

## 2016-01-16 ENCOUNTER — Inpatient Hospital Stay (HOSPITAL_BASED_OUTPATIENT_CLINIC_OR_DEPARTMENT_OTHER): Payer: Medicare Other | Admitting: Hematology and Oncology

## 2016-01-16 ENCOUNTER — Other Ambulatory Visit: Payer: Self-pay

## 2016-01-16 ENCOUNTER — Inpatient Hospital Stay: Payer: Medicare Other

## 2016-01-16 VITALS — BP 122/81 | HR 72 | Temp 96.2°F | Resp 18 | Wt 156.5 lb

## 2016-01-16 DIAGNOSIS — F419 Anxiety disorder, unspecified: Secondary | ICD-10-CM | POA: Diagnosis not present

## 2016-01-16 DIAGNOSIS — Z87891 Personal history of nicotine dependence: Secondary | ICD-10-CM | POA: Diagnosis not present

## 2016-01-16 DIAGNOSIS — C880 Waldenstrom macroglobulinemia: Secondary | ICD-10-CM

## 2016-01-16 DIAGNOSIS — F329 Major depressive disorder, single episode, unspecified: Secondary | ICD-10-CM | POA: Insufficient documentation

## 2016-01-16 DIAGNOSIS — Z79899 Other long term (current) drug therapy: Secondary | ICD-10-CM

## 2016-01-16 DIAGNOSIS — K219 Gastro-esophageal reflux disease without esophagitis: Secondary | ICD-10-CM | POA: Insufficient documentation

## 2016-01-16 DIAGNOSIS — G4730 Sleep apnea, unspecified: Secondary | ICD-10-CM | POA: Insufficient documentation

## 2016-01-16 DIAGNOSIS — Z23 Encounter for immunization: Secondary | ICD-10-CM

## 2016-01-16 LAB — COMPREHENSIVE METABOLIC PANEL
ALT: 21 U/L (ref 17–63)
AST: 30 U/L (ref 15–41)
Albumin: 4.8 g/dL (ref 3.5–5.0)
Alkaline Phosphatase: 97 U/L (ref 38–126)
Anion gap: 12 (ref 5–15)
BUN: 22 mg/dL — ABNORMAL HIGH (ref 6–20)
CO2: 24 mmol/L (ref 22–32)
Calcium: 9.4 mg/dL (ref 8.9–10.3)
Chloride: 100 mmol/L — ABNORMAL LOW (ref 101–111)
Creatinine, Ser: 0.78 mg/dL (ref 0.61–1.24)
GFR calc Af Amer: >60 mL/min (ref >60)
GFR calc non Af Amer: >60 mL/min (ref >60)
Glucose, Bld: 94 mg/dL (ref 65–99)
Potassium: 3.9 mmol/L (ref 3.5–5.1)
Sodium: 136 mmol/L (ref 135–145)
Total Bilirubin: 0.9 mg/dL (ref 0.3–1.2)
Total Protein: 8.1 g/dL (ref 6.5–8.1)

## 2016-01-16 LAB — CBC WITH DIFFERENTIAL/PLATELET
Basophils Absolute: 0.1 10*3/uL (ref 0–0.1)
Basophils Relative: 1 %
Eosinophils Absolute: 0.1 10*3/uL (ref 0–0.7)
Eosinophils Relative: 1 %
HCT: 40.2 % (ref 40.0–52.0)
Hemoglobin: 13.9 g/dL (ref 13.0–18.0)
Lymphocytes Relative: 14 %
Lymphs Abs: 1.4 10*3/uL (ref 1.0–3.6)
MCH: 30.2 pg (ref 26.0–34.0)
MCHC: 34.7 g/dL (ref 32.0–36.0)
MCV: 87 fL (ref 80.0–100.0)
Monocytes Absolute: 0.8 10*3/uL (ref 0.2–1.0)
Monocytes Relative: 8 %
Neutro Abs: 7.7 10*3/uL — ABNORMAL HIGH (ref 1.4–6.5)
Neutrophils Relative %: 76 %
Platelets: 349 10*3/uL (ref 150–440)
RBC: 4.62 MIL/uL (ref 4.40–5.90)
RDW: 14.4 % (ref 11.5–14.5)
WBC: 10.1 10*3/uL (ref 3.8–10.6)

## 2016-01-16 MED ORDER — INFLUENZA VAC SPLIT QUAD 0.5 ML IM SUSY
0.5000 mL | PREFILLED_SYRINGE | Freq: Once | INTRAMUSCULAR | Status: AC
  Administered 2016-01-16: 0.5 mL via INTRAMUSCULAR
  Filled 2016-01-16: qty 0.5

## 2016-01-16 NOTE — Progress Notes (Signed)
[place] [facility] day:  01/16/2016  Chief Complaint: [name] is a 66 y.o. male with 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who is seen for reassessment.  HPI:  The patient was diagnosed with Waldenstrm's macroglobulinemia in 05/2010 after presenting with light headedness/dizziness, headache and visual changes,  Blood work was "abnormal".  He was referred to [facility] at [facility].  Bone marrow was performed (no results available).  He received weekly Rituxan x 4 followed by maintenance Rituxan x 2 years (last 06/2012).  He was admitted to [facility] with dizziness and vertigo in 09/2013.  Hyperviscosity was suspected but not documented.  He underwent plasmapheresis at [facility] x 1 with significant improvement in symptoms.  He received Rituxan re-induction (weekly x 4).  Treatment completed in 10/2013.  He was noted to have a PET negative nodule in 02/2014.  He was started on ibrutinib, but discontinued in 02/2014 secondary to poor tolerance (GI upset).  Maintenance Rituxan was restarted in 04/18/2014 and discontinued in 02/2015.  IgM trend as follows:  4400 on 02/10/2010, 5130 on 04/03/2010, 5180 on 04/28/2010, 3530 on 08/12/2010, 3350 on 10/23/2010, 2600 on 01/02/2011, 2670 on 02/27/2011, 2210 on 05/08/2011, 1610 on 07/01/2011, 652 on 07/01/2012, 793 on 01/04/2013, 863 on 05/30/2013, 546 on 02/25/2015, 482 on 04/29/2015 and 522 on 09/11/2015.  He notes symptoms in legs from the knees down x 1 year. He has discomfort when gets him up at night.  Legs tingle.  He is able to ambulate.  He has fallen a few times. He feels like his right leg feel like it is going to give away when he turns a certain way.  He possibly saw a neurologist for EMG and noted "no nerve damage".  Notes from [name] on 10/08/2011 revealed mildly abnormal electrodiagnostic study due to abnormal right sural sensory prolonged distal latency. It was felt that these findings do not  explain his pain. Further workup was suggested.  Head MRI on 03/28/2015 revealed no acute intracranial abnormality.  Thoracic spine MRI on 12/15/016 revealed no acute abnormality.  Marrow signal was unremarkable. The central canal and foramina were widely patent at all levels  The patient was last seen in the medical oncology clinic on 09/11/2015 by [name].  At that time, he reported bilateral lower leg pain.  Pain was described as aching and started approximately 6 months.  Pain was progressive in past 3 months (right > left).  He sometimes had to lift his on leg with his pants in order to move his right leg.  He also reported moderate fatigue.  He had pain in the small joints of the fingers as well as hot flashes and episodes of diaphoresis and confusion.  Labs on 09/11/2015 revealed a hematocrit of 39.4, hemoglobin 13.7, MCV 87.4, platelets 351,000, WBC 6600 with an ANC of 4400.  SPEP revealed a 0.3 gm/dL IgM monoclonal protein with lambda light chain specificity.  IgM was 522 (20-172).  Kappa free light chains were 15.62, lambda free light chains 19.02 with a ratio of 0.82 (normal). Creatinine was 0.98.  During the interim, he denies any new complaints.   Past Medical History:  Diagnosis Date  . Anemia    prev bone marrow bx 02/11/10  . Anxiety   . Bone cancer ([place])   . Colitis 2004  . Depression   . GERD (gastroesophageal reflux disease)   . Need for prophylactic vaccination and inoculation against influenza 01/31/2013  . Sleep apnea 09-12-10  Uses CPAP  . 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[facility])     Past Surgical History:  Procedure Laterality Date  . COLONOSCOPY  03/17/2011   Procedure: COLONOSCOPY;  Surgeon: Jeryl Columbia, MD;  Location: [facility] ENDOSCOPY;  Service: Endoscopy;  Laterality: N/A;  . HIP FRACTURE SURGERY  January 2013   R hip  . NASAL FRACTURE SURGERY      History reviewed. No pertinent family history.  Social History:  reports that he quit smoking about 8  years ago. He has never used smokeless tobacco. He reports that he does not drink alcohol or use drugs.  The patient is accompanied by his wife, [name], today.  Allergies:  Allergies  Allergen Reactions  . Ibrutinib Nausea Only    Current Medications: Current Outpatient Prescriptions  Medication Sig Dispense Refill  . clonazePAM (KLONOPIN) 0.5 MG tablet TAKE 1 TABLET BY MOUTH 3 TIMES DAILY AS NEEDED FOR ANXIETY 30 tablet 1  . pantoprazole (PROTONIX) 40 MG tablet Take 40 mg by mouth daily.    Marland Kitchen PARoxetine (PAXIL) 40 MG tablet Take 40 mg by mouth every morning.    . Vitamin D, Cholecalciferol, 1000 UNITS TABS Take by mouth.     No current facility-administered medications for this visit.     Review of Systems:  GENERAL:  Feels "ok".  Fatigue.  No fevers or sweats.  Weight loss of 5 pounds since last visit. PERFORMANCE STATUS (ECOG):  1 HEENT:  No visual changes, runny nose, sore throat, mouth sores or tenderness. Lungs: No shortness of breath or cough.  No hemoptysis. Cardiac:  No chest pain, palpitations, orthopnea, or PND. GI:  No nausea, vomiting, diarrhea, constipation, melena or hematochezia. GU:  No urgency, frequency, dysuria, or hematuria. Musculoskeletal:  No back pain.  No joint pain.  No muscle tenderness. Extremities:  Legs ache.  No swelling. Skin:  No rashes or skin changes. Neuro:  Legs tingle.  No headache, focal weakness, balance or coordination issues. Endocrine:  No diabetes, thyroid issues, hot flashes or night sweats. Psych:  No mood changes, depression or anxiety. Pain:  Leg discomfort. Review of systems:  All other systems reviewed and found to be negative.  Physical Exam: Blood pressure 122/81, pulse 72, temperature (!) 96.2 F (35.7 C), temperature source Tympanic, resp. rate 18, weight 156 lb 8.4 oz (71 kg). GENERAL:  Well developed, well nourished, gentleman sitting comfortably in the exam room in no acute distress. MENTAL STATUS:  Alert and oriented to  person, place and time. HEAD:  Pearline Cables hair.  Mustache.  Normocephalic, atraumatic, face symmetric, no Cushingoid features. EYES:  Glasses.  Pupils equal round and reactive to light and accomodation.  No conjunctivitis or scleral icterus. ENT:  Oropharynx clear without lesion.  Tongue normal. Mucous membranes moist.  RESPIRATORY:  Clear to auscultation without rales, wheezes or rhonchi. CARDIOVASCULAR:  Regular rate and rhythm without murmur, rub or gallop. ABDOMEN:  Soft, non-tender, with active bowel sounds, and no hepatosplenomegaly.  No masses. SKIN:  No rashes, ulcers or lesions. EXTREMITIES: No edema, no skin discoloration or tenderness.  No palpable cords. LYMPH NODES: No palpable cervical, supraclavicular, axillary or inguinal adenopathy  PSYCH:  Appropriate.   Orders Only on 01/16/2016  Component Date Value Ref Range Status  . WBC 01/16/2016 10.1  3.8 - 10.6 K/uL Final  . RBC 01/16/2016 4.62  4.40 - 5.90 MIL/uL Final  . Hemoglobin 01/16/2016 13.9  13.0 - 18.0 g/dL Final  . HCT 01/16/2016 40.2  40.0 - 52.0 % Final  .  MCV 01/16/2016 87.0  80.0 - 100.0 fL Final  . MCH 01/16/2016 30.2  26.0 - 34.0 pg Final  . MCHC 01/16/2016 34.7  32.0 - 36.0 g/dL Final  . RDW 01/16/2016 14.4  11.5 - 14.5 % Final  . Platelets 01/16/2016 349  150 - 440 K/uL Final  . Neutrophils Relative % 01/16/2016 76  % Final  . Neutro Abs 01/16/2016 7.7* 1.4 - 6.5 K/uL Final  . Lymphocytes Relative 01/16/2016 14  % Final  . Lymphs Abs 01/16/2016 1.4  1.0 - 3.6 K/uL Final  . Monocytes Relative 01/16/2016 8  % Final  . Monocytes Absolute 01/16/2016 0.8  0.2 - 1.0 K/uL Final  . Eosinophils Relative 01/16/2016 1  % Final  . Eosinophils Absolute 01/16/2016 0.1  0 - 0.7 K/uL Final  . Basophils Relative 01/16/2016 1  % Final  . Basophils Absolute 01/16/2016 0.1  0 - 0.1 K/uL Final  . Sodium 01/16/2016 136  135 - 145 mmol/L Final  . Potassium 01/16/2016 3.9  3.5 - 5.1 mmol/L Final  . Chloride 01/16/2016 100* 101 - 111  mmol/L Final  . CO2 01/16/2016 24  22 - 32 mmol/L Final  . Glucose, Bld 01/16/2016 94  65 - 99 mg/dL Final  . BUN 01/16/2016 22* 6 - 20 mg/dL Final  . Creatinine, Ser 01/16/2016 0.78  0.61 - 1.24 mg/dL Final  . Calcium 01/16/2016 9.4  8.9 - 10.3 mg/dL Final  . Total Protein 01/16/2016 8.1  6.5 - 8.1 g/dL Final  . Albumin 01/16/2016 4.8  3.5 - 5.0 g/dL Final  . AST 01/16/2016 30  15 - 41 U/L Final  . ALT 01/16/2016 21  17 - 63 U/L Final  . Alkaline Phosphatase 01/16/2016 97  38 - 126 U/L Final  . Total Bilirubin 01/16/2016 0.9  0.3 - 1.2 mg/dL Final  . GFR calc non Af Amer 01/16/2016 >60  >60 mL/min Final  . GFR calc Af Amer 01/16/2016 >60  >60 mL/min Final   Comment: (NOTE) The eGFR has been calculated using the CKD EPI equation. This calculation has not been validated in all clinical situations. eGFR's persistently <60 mL/min signify possible Chronic Wood Disease.   [name] gap 01/16/2016 12  5 - 15 Final    Assessment:  ZIGMUND LINSE is a 65 y.o. male with Waldenstrom's macroglobulinemia.  He was diagnosed with Waldenstrm's macroglobulinemia in 05/2010 after presenting with light headedness/dizziness, headache and visual changes,    He was referred to [facility] at [facility].  Bone marrow was performed (no results available).  He received weekly Rituxan x 4 followed by maintenance Rituxan x 2 years (last 06/2012).  He was admitted to [facility] with dizziness and vertigo in 09/2013.  Hyperviscosity was suspected but not documented.  He underwent plasmapheresis at [facility] x 1 with significant improvement in symptoms.  He received Rituxan re-induction (weekly x 4).  Treatment completed in 10/2013.  He was noted to have a PET negative nodule in 02/2014.  He was started on ibrutinib, but discontinued in 02/2014 secondary to poor tolerance (GI upset).  Maintenance Rituxan was restarted in 04/18/2014 and discontinued in 02/2015.  IgM trend as follows:  4400 on 02/10/2010, 5130 on  04/03/2010, 5180 on 04/28/2010, 3530 on 08/12/2010, 3350 on 10/23/2010, 2600 on 01/02/2011, 2670 on 02/27/2011, 2210 on 05/08/2011, 1610 on 07/01/2011, 652 on 07/01/2012, 793 on 01/04/2013, 863 on 05/30/2013, 546 on 02/25/2015, 482 on 04/29/2015 and 522 on 09/11/2015.  Head MRI on 03/28/2015 revealed no  acute intracranial abnormality.  Thoracic spine MRI on 12/15/016 revealed no acute abnormality.  Marrow signal was unremarkable. The central canal and foramina were widely patent at all levels  He notes symptoms in legs from the knees down x 1 year. He has discomfort when gets him up at night.  Legs tingle.  He is able to ambulate.  He has fallen a few times. He feels like his right leg feel like it is going to give away when he turns a certain way.    During the interim, he denies any new complaints.  Exam reveals no adenopathy or hepatosplenomegaly.  Plan: 1.  Discuss entire medical history, diagnosis and management of Waldenstrom's macroglobulinemia. 2.  Labs today:  CBC with diff, CMP, SPEP with immunoglobulins, free light chain assay, cryoglobulins, serum viscosity, beta-2 microglobulin, GM1 antibody. 3.  Obtain neurology records. 4.  RTC in 3 months for MD assess and labs (CBC with diff, CMP, SPEP, IgM).   Harold Asal, MD  01/16/2016, 11:13 AM

## 2016-01-16 NOTE — Progress Notes (Signed)
Patient states he will be having right total hip replacement November 16 at [facility].  Patient continues to have pain and neuropathy in right lower extremity.  States his pain is coming from his deteriorated hip.  Patient had prior surgery on that side and the rod has shifted requiring removal and new hip replacement.  Patient would like to get a flu shot today.

## 2016-01-17 LAB — BETA 2 MICROGLOBULIN, SERUM: Beta-2 Microglobulin: 1.6 mg/L (ref 0.6–2.4)

## 2016-01-17 LAB — KAPPA/LAMBDA LIGHT CHAINS
Kappa free light chain: 15.6 mg/L (ref 3.3–19.4)
Kappa, lambda light chain ratio: 0.54 (ref 0.26–1.65)
Lambda free light chains: 28.7 mg/L — ABNORMAL HIGH (ref 5.7–26.3)

## 2016-01-17 LAB — MISC LABCORP TEST (SEND OUT)

## 2016-01-19 LAB — VISCOSITY, SERUM: Viscosity, Serum: 1.8 rel.saline (ref 1.6–1.9)

## 2016-01-20 LAB — MULTIPLE MYELOMA PANEL, SERUM
Albumin SerPl Elph-Mcnc: 4.2 g/dL (ref 2.9–4.4)
Albumin/Glob SerPl: 1.3 (ref 0.7–1.7)
Alpha 1: 0.2 g/dL (ref 0.0–0.4)
Alpha2 Glob SerPl Elph-Mcnc: 0.9 g/dL (ref 0.4–1.0)
B-Globulin SerPl Elph-Mcnc: 1.1 g/dL (ref 0.7–1.3)
Gamma Glob SerPl Elph-Mcnc: 1.1 g/dL (ref 0.4–1.8)
Globulin, Total: 3.3 g/dL (ref 2.2–3.9)
IgA: 148 mg/dL (ref 61–437)
IgG (Immunoglobin G), Serum: 616 mg/dL — ABNORMAL LOW (ref 700–1600)
IgM, Serum: 588 mg/dL — ABNORMAL HIGH (ref 20–172)
M Protein SerPl Elph-Mcnc: 0.4 g/dL — ABNORMAL HIGH
Total Protein ELP: 7.5 g/dL (ref 6.0–8.5)

## 2016-01-20 LAB — CRYOGLOBULIN

## 2016-01-23 ENCOUNTER — Telehealth: Payer: Self-pay | Admitting: *Deleted

## 2016-01-23 NOTE — Telephone Encounter (Signed)
Called pt to let him know that we did get neurology appt and it is for 04/15/2016 at  10:30. It is so far out because the MD is going to be out for 3 weeks in Dec.   They will call him if they have a cancellation.  He states that he is having surgery for his hip in November and he will have recovery and physical therapy so Jan will probably work out good for him. I told the office that I would send note when done. They are fine since appt is so far away

## 2016-02-12 HISTORY — PX: HIP FRACTURE SURGERY: SHX118

## 2016-04-07 ENCOUNTER — Other Ambulatory Visit: Payer: Self-pay | Admitting: Nurse Practitioner

## 2016-04-15 ENCOUNTER — Other Ambulatory Visit: Payer: Self-pay | Admitting: *Deleted

## 2016-04-15 DIAGNOSIS — C880 Waldenstrom macroglobulinemia: Secondary | ICD-10-CM

## 2016-04-16 ENCOUNTER — Inpatient Hospital Stay: Payer: Medicare Other | Attending: Hematology and Oncology

## 2016-04-16 ENCOUNTER — Inpatient Hospital Stay (HOSPITAL_BASED_OUTPATIENT_CLINIC_OR_DEPARTMENT_OTHER): Payer: Medicare Other | Admitting: Hematology and Oncology

## 2016-04-16 ENCOUNTER — Encounter: Payer: Self-pay | Admitting: Hematology and Oncology

## 2016-04-16 ENCOUNTER — Other Ambulatory Visit: Payer: Self-pay

## 2016-04-16 VITALS — BP 122/77 | HR 80 | Temp 96.0°F | Resp 18 | Wt 157.3 lb

## 2016-04-16 DIAGNOSIS — G4730 Sleep apnea, unspecified: Secondary | ICD-10-CM | POA: Diagnosis not present

## 2016-04-16 DIAGNOSIS — K219 Gastro-esophageal reflux disease without esophagitis: Secondary | ICD-10-CM | POA: Insufficient documentation

## 2016-04-16 DIAGNOSIS — Z79899 Other long term (current) drug therapy: Secondary | ICD-10-CM | POA: Insufficient documentation

## 2016-04-16 DIAGNOSIS — Z87891 Personal history of nicotine dependence: Secondary | ICD-10-CM | POA: Insufficient documentation

## 2016-04-16 DIAGNOSIS — C880 Waldenstrom macroglobulinemia: Secondary | ICD-10-CM

## 2016-04-16 DIAGNOSIS — G608 Other hereditary and idiopathic neuropathies: Secondary | ICD-10-CM | POA: Diagnosis not present

## 2016-04-16 DIAGNOSIS — R202 Paresthesia of skin: Secondary | ICD-10-CM | POA: Diagnosis not present

## 2016-04-16 DIAGNOSIS — Z96641 Presence of right artificial hip joint: Secondary | ICD-10-CM | POA: Insufficient documentation

## 2016-04-16 DIAGNOSIS — F329 Major depressive disorder, single episode, unspecified: Secondary | ICD-10-CM | POA: Insufficient documentation

## 2016-04-16 DIAGNOSIS — F419 Anxiety disorder, unspecified: Secondary | ICD-10-CM | POA: Diagnosis not present

## 2016-04-16 LAB — CBC WITH DIFFERENTIAL/PLATELET
Basophils Absolute: 0.1 10*3/uL (ref 0–0.1)
Basophils Relative: 2 %
Eosinophils Absolute: 0.1 10*3/uL (ref 0–0.7)
Eosinophils Relative: 2 %
HCT: 38.2 % — ABNORMAL LOW (ref 40.0–52.0)
Hemoglobin: 12.8 g/dL — ABNORMAL LOW (ref 13.0–18.0)
Lymphocytes Relative: 24 %
Lymphs Abs: 1.4 10*3/uL (ref 1.0–3.6)
MCH: 29.2 pg (ref 26.0–34.0)
MCHC: 33.5 g/dL (ref 32.0–36.0)
MCV: 87.2 fL (ref 80.0–100.0)
Monocytes Absolute: 0.4 10*3/uL (ref 0.2–1.0)
Monocytes Relative: 7 %
Neutro Abs: 3.8 10*3/uL (ref 1.4–6.5)
Neutrophils Relative %: 65 %
Platelets: 404 10*3/uL (ref 150–440)
RBC: 4.38 MIL/uL — ABNORMAL LOW (ref 4.40–5.90)
RDW: 14.5 % (ref 11.5–14.5)
WBC: 5.9 10*3/uL (ref 3.8–10.6)

## 2016-04-16 LAB — COMPREHENSIVE METABOLIC PANEL
ALT: 11 U/L — ABNORMAL LOW (ref 17–63)
AST: 21 U/L (ref 15–41)
Albumin: 4.4 g/dL (ref 3.5–5.0)
Alkaline Phosphatase: 95 U/L (ref 38–126)
Anion gap: 9 (ref 5–15)
BUN: 26 mg/dL — ABNORMAL HIGH (ref 6–20)
CO2: 26 mmol/L (ref 22–32)
Calcium: 9.3 mg/dL (ref 8.9–10.3)
Chloride: 104 mmol/L (ref 101–111)
Creatinine, Ser: 0.89 mg/dL (ref 0.61–1.24)
GFR calc Af Amer: >60 mL/min (ref >60)
GFR calc non Af Amer: >60 mL/min (ref >60)
Glucose, Bld: 101 mg/dL — ABNORMAL HIGH (ref 65–99)
Potassium: 4 mmol/L (ref 3.5–5.1)
Sodium: 139 mmol/L (ref 135–145)
Total Bilirubin: 0.8 mg/dL (ref 0.3–1.2)
Total Protein: 7.9 g/dL (ref 6.5–8.1)

## 2016-04-16 NOTE — Progress Notes (Signed)
Per pt had R HIP replacement Nov 16/17  -per pt surg went well. Recently stopped using cane / Keelie Zemanek . C/o bilateral leg pain . Aching pain constant. X 6 mo. Not taking pain meds per pt. Doesn't like to take medication.

## 2016-04-16 NOTE — Progress Notes (Signed)
[place] [facility] day:  04/16/2016  Chief Complaint: [name] is a 66 y.o. male with 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who is seen for 3 month assessment.  HPI:  The patient was last seen in the medical oncology clinic on 01/16/2016.  At that time, he was seen for initial assessment by me.  At that time, he noted aching in his lower extremities for 1 year.  He had progressive pain.    Labs at last visit included a normal CBC with diff and CMP.  SPEP revealed a 0.4 gm/dL IgM monoclonal protein with lambda light chain specificity.  Kappa free light chains were 15.6, lambda free light chains 28.7 with a ratio of 0.54 (normal).  Cryglobulins were negative.  Serum viscosity was 1.8 (1.6-1.9).  Beta 2-microglobulin was 1.6 (normal).  GM1 antibody and anti-myelin associated glycoprotein (MAG) antibody were to be drawn.  He underwent right hip replacement on 02/27/2016.  He no longer needs a walker or a cane.  He continues to note an aching pain in his legs.  He has a tingling sensation going up his legs.  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   Symptomatically, he denies any fevers, sweats or weight loss.  He denies any bruising or bleeding.  He denies any adenopathy.   Past Medical History:  Diagnosis Date  . Anemia    prev bone marrow bx 02/11/10  . Anxiety   . Bone cancer ([place])   . Colitis 2004  . Depression   . GERD (gastroesophageal reflux disease)   . Need for prophylactic vaccination and inoculation against influenza 01/31/2013  . Sleep apnea 09-12-10   Uses CPAP  . 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[facility])     Past Surgical History:  Procedure Laterality Date  . COLONOSCOPY  03/17/2011   Procedure: COLONOSCOPY;  Surgeon: Jeryl Columbia, MD;  Location: [facility];  Service: Endoscopy;  Laterality: N/A;  . HIP FRACTURE SURGERY  January 2013   R hip  . NASAL FRACTURE SURGERY       History reviewed. No pertinent family history.  Social History:  reports that he quit smoking about 9 years ago. He has never used smokeless tobacco. He reports that he does not drink alcohol or use drugs.  The patient is accompanied by his wife, Stanton Kidney, today.  Allergies:  Allergies  Allergen Reactions  . Ibrutinib Nausea Only    Current Medications: Current Outpatient Prescriptions  Medication Sig Dispense Refill  . pantoprazole (PROTONIX) 40 MG tablet Take 40 mg by mouth daily.    Marland Kitchen PARoxetine (PAXIL) 40 MG tablet Take 40 mg by mouth every morning.    . Vitamin D, Cholecalciferol, 1000 UNITS TABS Take by mouth.     No current facility-administered medications for this visit.     Review of Systems:  GENERAL:  Feels "ok".  No fevers or sweats.  Weight gain of 1 pound since last visit. PERFORMANCE STATUS (ECOG):  1 HEENT:  No visual changes, runny nose, sore throat, mouth sores or tenderness. Lungs: No shortness of breath or cough.  No hemoptysis. Cardiac:  No chest pain, palpitations, orthopnea, or PND. GI:  No nausea, vomiting, diarrhea, constipation, melena or hematochezia. GU:  No urgency, frequency, dysuria, or hematuria. Musculoskeletal:  No back pain.  s/p right hip replacement.  No muscle tenderness. Extremities:  Legs ache (see HPI).  No swelling. Skin:  No rashes or skin changes. Neuro:  Legs tingle.  No headache, focal weakness, balance or coordination issues. Endocrine:  No diabetes, thyroid issues, hot flashes or night sweats. Psych:  No mood changes, depression or anxiety. Pain:  Leg discomfort. Review of systems:  All other systems reviewed and found to be negative.  Physical Exam: Blood pressure 122/77, pulse 80, temperature (!) 96 F (35.6 C), temperature source Tympanic, resp. rate 18, weight 157 lb 4.8 oz (71.3 kg). GENERAL:  Well developed, well nourished, gentleman sitting comfortably in the exam room in no acute distress. MENTAL STATUS:  Alert and  oriented to person, place and time. HEAD:  Wearing a cap.  Gray hair.  Mustache.  Normocephalic, atraumatic, face symmetric, no Cushingoid features. EYES:  Glasses.  Pupils equal round and reactive to light and accomodation.  No conjunctivitis or scleral icterus. ENT:  Oropharynx clear without lesion.  Tongue normal. Mucous membranes moist.  RESPIRATORY:  Clear to auscultation without rales, wheezes or rhonchi. CARDIOVASCULAR:  Regular rate and rhythm without murmur, rub or gallop. ABDOMEN:  Soft, non-tender, with active bowel sounds, and no hepatosplenomegaly.  No masses. SKIN:  No rashes, ulcers or lesions. EXTREMITIES: No edema, no skin discoloration or tenderness.  No palpable cords. LYMPH NODES: No palpable cervical, supraclavicular, axillary or inguinal adenopathy  PSYCH:  Appropriate.   Orders Only on 04/16/2016  Component Date Value Ref Range Status  . WBC 04/16/2016 5.9  3.8 - 10.6 K/uL Final  . RBC 04/16/2016 4.38* 4.40 - 5.90 MIL/uL Final  . Hemoglobin 04/16/2016 12.8* 13.0 - 18.0 g/dL Final  . HCT 04/16/2016 38.2* 40.0 - 52.0 % Final  . MCV 04/16/2016 87.2  80.0 - 100.0 fL Final  . MCH 04/16/2016 29.2  26.0 - 34.0 pg Final  . MCHC 04/16/2016 33.5  32.0 - 36.0 g/dL Final  . RDW 04/16/2016 14.5  11.5 - 14.5 % Final  . Platelets 04/16/2016 404  150 - 440 K/uL Final  . Neutrophils Relative % 04/16/2016 65  % Final  . Neutro Abs 04/16/2016 3.8  1.4 - 6.5 K/uL Final  . Lymphocytes Relative 04/16/2016 24  % Final  . Lymphs Abs 04/16/2016 1.4  1.0 - 3.6 K/uL Final  . Monocytes Relative 04/16/2016 7  % Final  . Monocytes Absolute 04/16/2016 0.4  0.2 - 1.0 K/uL Final  . Eosinophils Relative 04/16/2016 2  % Final  . Eosinophils Absolute 04/16/2016 0.1  0 - 0.7 K/uL Final  . Basophils Relative 04/16/2016 2  % Final  . Basophils Absolute 04/16/2016 0.1  0 - 0.1 K/uL Final  . Sodium 04/16/2016 139  135 - 145 mmol/L Final  . Potassium 04/16/2016 4.0  3.5 - 5.1 mmol/L Final  .  Chloride 04/16/2016 104  101 - 111 mmol/L Final  . CO2 04/16/2016 26  22 - 32 mmol/L Final  . Glucose, Bld 04/16/2016 101* 65 - 99 mg/dL Final  . BUN 04/16/2016 26* 6 - 20 mg/dL Final  . Creatinine, Ser 04/16/2016 0.89  0.61 - 1.24 mg/dL Final  . Calcium 04/16/2016 9.3  8.9 - 10.3 mg/dL Final  . Total Protein 04/16/2016 7.9  6.5 - 8.1 g/dL Final  . Albumin 04/16/2016 4.4  3.5 - 5.0 g/dL Final  . AST 04/16/2016 21  15 - 41 U/L Final  . ALT 04/16/2016 11* 17 - 63 U/L Final  . Alkaline Phosphatase 04/16/2016 95  38 - 126 U/L Final  . Total Bilirubin 04/16/2016 0.8  0.3 - 1.2 mg/dL Final  . GFR calc non Af Amer 04/16/2016 >60  >  60 mL/min Final  . GFR calc Af Amer 04/16/2016 >60  >60 mL/min Final   Comment: (NOTE) The eGFR has been calculated using the CKD EPI equation. This calculation has not been validated in all clinical situations. eGFR's persistently <60 mL/min signify possible Chronic Kidney Disease.   Georgiann Hahn gap 04/16/2016 9  5 - 15 Final    Assessment:  TIDUS UPCHURCH is a 66 y.o. male with Waldenstrom's macroglobulinemia.  He was diagnosed with Waldenstrm's macroglobulinemia in 05/2010 after presenting with light headedness/dizziness, headache and visual changes,    He was referred to [facility] at [facility].  Bone marrow was performed (no results available).  He received weekly Rituxan x 4 followed by maintenance Rituxan x 2 years (last 06/2012).  He was admitted to [facility] with dizziness and vertigo in 09/2013.  Hyperviscosity was suspected but not documented.  He underwent plasmapheresis at [facility] x 1 with significant improvement in symptoms.  He received Rituxan re-induction (weekly x 4).  Treatment completed in 10/2013.  He was noted to have a PET negative nodule in 02/2014.  He was started on ibrutinib, but discontinued in 02/2014 secondary to poor tolerance (GI upset).  Maintenance Rituxan was restarted in 04/18/2014 and discontinued in 02/2015.  IgM trend as  follows:  4400 on 02/10/2010, 5130 on 04/03/2010, 5180 on 04/28/2010, 3530 on 08/12/2010, 3350 on 10/23/2010, 2600 on 01/02/2011, 2670 on 02/27/2011, 2210 on 05/08/2011, 1610 on 07/01/2011, 652 on 07/01/2012, 793 on 01/04/2013, 863 on 05/30/2013, 546 on 02/25/2015, 482 on 04/29/2015, 522 on 09/11/2015, and 588 on 01/15/2017.  Head MRI on 03/28/2015 revealed no acute intracranial abnormality.  Thoracic spine MRI on 12/15/016 revealed no acute abnormality.  Marrow signal was unremarkable. The central canal and foramina were widely patent at all levels.  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 He has discomfort when gets him up at night.  Legs tingle.  He is able to ambulate.   Symptomatically, he has ongoing aching and tingling in his legs.  Exam reveals no adenopathy or hepatosplenomegaly.  Plan: 1.  Labs today:  CBC with diff, CMP,  SPEP. 2.  Patient to have labs drawn with Dr Manuella Ghazi (GM1 antibody and anti-MAG antibody). 3.  RTC in 3 months for MD assessment and labs (CBC with diff, CMP, SPEP, IgM).   Lequita Asal, MD 04/16/2016

## 2016-04-17 ENCOUNTER — Telehealth: Payer: Self-pay | Admitting: *Deleted

## 2016-04-17 LAB — PROTEIN ELECTROPHORESIS, SERUM
A/G Ratio: 1.2 (ref 0.7–1.7)
Albumin ELP: 3.9 g/dL (ref 2.9–4.4)
Alpha-1-Globulin: 0.2 g/dL (ref 0.0–0.4)
Alpha-2-Globulin: 0.8 g/dL (ref 0.4–1.0)
Beta Globulin: 1.1 g/dL (ref 0.7–1.3)
Gamma Globulin: 1.1 g/dL (ref 0.4–1.8)
Globulin, Total: 3.2 g/dL (ref 2.2–3.9)
M-Spike, %: 0.4 g/dL — ABNORMAL HIGH
PDF: 0
Total Protein ELP: 7.1 g/dL (ref 6.0–8.5)

## 2016-04-17 NOTE — Telephone Encounter (Signed)
Set up today for GMI and mag apeheno labs.  MD notified.

## 2016-05-27 ENCOUNTER — Other Ambulatory Visit: Payer: Self-pay | Admitting: *Deleted

## 2016-05-27 ENCOUNTER — Telehealth: Payer: Self-pay | Admitting: *Deleted

## 2016-05-27 DIAGNOSIS — C880 Waldenstrom macroglobulinemia: Secondary | ICD-10-CM

## 2016-05-27 NOTE — Telephone Encounter (Signed)
Asking to be seen as soon as possible, he is feeling "rough " again, i.e. dizziness and head ache times 2 days and does not feel he can wait until his appt in April. Wife reports last time he felt like this he was given chemotherapy. Please advise

## 2016-05-27 NOTE — Telephone Encounter (Signed)
Appt 2/15 @ 145per Dr Mike Gip, wife informed and agrees to appt

## 2016-05-28 ENCOUNTER — Inpatient Hospital Stay (HOSPITAL_BASED_OUTPATIENT_CLINIC_OR_DEPARTMENT_OTHER): Payer: Medicare Other | Admitting: Hematology and Oncology

## 2016-05-28 ENCOUNTER — Encounter: Payer: Self-pay | Admitting: Hematology and Oncology

## 2016-05-28 ENCOUNTER — Inpatient Hospital Stay: Payer: Medicare Other | Attending: Hematology and Oncology

## 2016-05-28 VITALS — BP 122/87 | HR 87 | Temp 94.1°F | Resp 18 | Wt 159.6 lb

## 2016-05-28 DIAGNOSIS — R42 Dizziness and giddiness: Secondary | ICD-10-CM

## 2016-05-28 DIAGNOSIS — C880 Waldenstrom macroglobulinemia: Secondary | ICD-10-CM | POA: Diagnosis present

## 2016-05-28 DIAGNOSIS — K219 Gastro-esophageal reflux disease without esophagitis: Secondary | ICD-10-CM | POA: Insufficient documentation

## 2016-05-28 DIAGNOSIS — G4730 Sleep apnea, unspecified: Secondary | ICD-10-CM | POA: Insufficient documentation

## 2016-05-28 DIAGNOSIS — Z87891 Personal history of nicotine dependence: Secondary | ICD-10-CM | POA: Diagnosis not present

## 2016-05-28 DIAGNOSIS — Z79899 Other long term (current) drug therapy: Secondary | ICD-10-CM | POA: Diagnosis not present

## 2016-05-28 DIAGNOSIS — F419 Anxiety disorder, unspecified: Secondary | ICD-10-CM | POA: Diagnosis not present

## 2016-05-28 LAB — CBC WITH DIFFERENTIAL/PLATELET
Basophils Absolute: 0.1 10*3/uL (ref 0–0.1)
Basophils Relative: 1 %
Eosinophils Absolute: 0.1 10*3/uL (ref 0–0.7)
Eosinophils Relative: 1 %
HCT: 38.4 % — ABNORMAL LOW (ref 40.0–52.0)
Hemoglobin: 13.2 g/dL (ref 13.0–18.0)
Lymphocytes Relative: 21 %
Lymphs Abs: 1.7 10*3/uL (ref 1.0–3.6)
MCH: 29.5 pg (ref 26.0–34.0)
MCHC: 34.4 g/dL (ref 32.0–36.0)
MCV: 85.6 fL (ref 80.0–100.0)
Monocytes Absolute: 0.7 10*3/uL (ref 0.2–1.0)
Monocytes Relative: 9 %
Neutro Abs: 5.5 10*3/uL (ref 1.4–6.5)
Neutrophils Relative %: 68 %
Platelets: 360 10*3/uL (ref 150–440)
RBC: 4.48 MIL/uL (ref 4.40–5.90)
RDW: 14.7 % — ABNORMAL HIGH (ref 11.5–14.5)
WBC: 8.1 10*3/uL (ref 3.8–10.6)

## 2016-05-28 LAB — COMPREHENSIVE METABOLIC PANEL
ALT: 19 U/L (ref 17–63)
AST: 35 U/L (ref 15–41)
Albumin: 4.5 g/dL (ref 3.5–5.0)
Alkaline Phosphatase: 88 U/L (ref 38–126)
Anion gap: 8 (ref 5–15)
BUN: 30 mg/dL — ABNORMAL HIGH (ref 6–20)
CO2: 25 mmol/L (ref 22–32)
Calcium: 9.3 mg/dL (ref 8.9–10.3)
Chloride: 105 mmol/L (ref 101–111)
Creatinine, Ser: 1.06 mg/dL (ref 0.61–1.24)
GFR calc Af Amer: >60 mL/min (ref >60)
GFR calc non Af Amer: >60 mL/min (ref >60)
Glucose, Bld: 83 mg/dL (ref 65–99)
Potassium: 4.3 mmol/L (ref 3.5–5.1)
Sodium: 138 mmol/L (ref 135–145)
Total Bilirubin: 0.7 mg/dL (ref 0.3–1.2)
Total Protein: 8 g/dL (ref 6.5–8.1)

## 2016-05-28 LAB — URIC ACID: Uric Acid, Serum: 6 mg/dL (ref 4.4–7.6)

## 2016-05-28 NOTE — Progress Notes (Signed)
Patient states he gets dizzy and nauseated when he lies down.  States he is lightheaded today.  C/o headache today.  Asking about lab results from previous appointment.

## 2016-05-28 NOTE — Progress Notes (Addendum)
[facility] day:  05/28/2016  Chief Complaint: [name] is a 66 y.o. male with 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who is seen for 1 month reassessment secondary to dizziness.  HPI:  The patient was last seen in the medical oncology clinic on 04/16/2016.  At that time, he he noted ongoing aching and tingling in his legs.  Exam revealed no adenopathy or hepatosplenomegaly.  Labs included a normal CBC with diff, CMP.  SPEP revealed a 0.4 gm/dL monoclonal spike.  He contacted the clinic yesterday regarding symptoms of headache and dizziness that he felt was associated with recurrence of his disease.  Symptomatically, he is feeling dizzy and has a constant headache.  Symptoms started about a week ago. Sometimes when he changing positions quickly, he feels nauseated but does not vomit. He has to lay day until the dizziness subsides. He continues to complain of numbness and tingling in the lower extremities. He denies confusion or altered mental status. He occasionally forgets people names. He drinks coffee and [place] daily. Has a good appetite. He denies tinnitus.   Past Medical History:  Diagnosis Date  . Anemia    prev bone marrow bx 02/11/10  . Anxiety   . Bone cancer ([place])   . Colitis 2004  . Depression   . GERD (gastroesophageal reflux disease)   . Need for prophylactic vaccination and inoculation against influenza 01/31/2013  . Sleep apnea 09-12-10   Uses CPAP  . 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[facility])     Past Surgical History:  Procedure Laterality Date  . COLONOSCOPY  03/17/2011   Procedure: COLONOSCOPY;  Surgeon: Jeryl Columbia, MD;  Location: [facility] ENDOSCOPY;  Service: Endoscopy;  Laterality: N/A;  . HIP FRACTURE SURGERY  January 2013   R hip  . NASAL FRACTURE SURGERY      History reviewed. No pertinent family history.  Social History:  reports that he quit smoking about 9 years ago. He has never used smokeless tobacco. He  reports that he does not drink alcohol or use drugs.  The patient is accompanied by his wife, [name], today.  Allergies:  Allergies  Allergen Reactions  . Ibrutinib Nausea Only    Current Medications: Current Outpatient Prescriptions  Medication Sig Dispense Refill  . pantoprazole (PROTONIX) 40 MG tablet Take 40 mg by mouth daily.    Marland Kitchen PARoxetine (PAXIL) 40 MG tablet Take 40 mg by mouth every morning.    . Vitamin D, Cholecalciferol, 1000 UNITS TABS Take by mouth.     No current facility-administered medications for this visit.     Review of Systems:  GENERAL:  Feels "ok".  No fevers or sweats.  Weight gain of 2 pound since last visit. PERFORMANCE STATUS (ECOG):  1 HEENT:  No visual changes, runny nose, sore throat, mouth sores or tenderness. Lungs: No shortness of breath or cough.  No hemoptysis. Cardiac:  No chest pain, palpitations, orthopnea, or PND. GI:  No nausea, vomiting, diarrhea, constipation, melena or hematochezia. GU:  No urgency, frequency, dysuria, or hematuria. Musculoskeletal:  No back pain.  s/p right hip replacement.  No muscle tenderness. Extremities:  Legs ache (see HPI).  No swelling. Skin:  No rashes or skin changes. Neuro:  Legs tingle.  Headache.  Dizziness with nausea when changing positions (standing to lying). No focal weakness, balance or coordination issues. Endocrine:  No diabetes, thyroid issues, hot flashes or night sweats. Psych:  No mood changes, depression or anxiety. Pain:  Leg discomfort. Review of  systems:  All other systems reviewed and found to be negative.  Physical Exam: Blood pressure 122/87, pulse 87, temperature (!) 94.1 F (34.5 C), temperature source Tympanic, resp. rate 18, weight 159 lb 9.8 oz (72.4 kg). GENERAL:  Well developed, well nourished, gentleman sitting comfortably in the exam room in no acute distress. MENTAL STATUS:  Alert and oriented to person, place and time. HEAD:  Wearing a cap.  Gray hair.  Mustache.   Normocephalic, atraumatic, face symmetric, no Cushingoid features. EYES:  Glasses.  Pupils equal round and reactive to light and accomodation.  No conjunctivitis or scleral icterus. ENT:  Oropharynx clear without lesion.  Tongue normal. Mucous membranes moist.  Dix-Hallpike maneuver + for dizziness. RESPIRATORY:  Clear to auscultation without rales, wheezes or rhonchi. CARDIOVASCULAR:  Regular rate and rhythm without murmur, rub or gallop. ABDOMEN:  Soft, non-tender, with active bowel sounds, and no hepatosplenomegaly.  No masses. SKIN:  No rashes, ulcers or lesions. EXTREMITIES: No edema, no skin discoloration or tenderness.  No palpable cords. LYMPH NODES: No palpable cervical, supraclavicular, axillary or inguinal adenopathy  NEURO:  Appropriate. PSYCH:  Appropriate.   Clinical Support on 05/28/2016  Component Date Value Ref Range Status  . WBC 05/28/2016 8.1  3.8 - 10.6 K/uL Final  . RBC 05/28/2016 4.48  4.40 - 5.90 MIL/uL Final  . Hemoglobin 05/28/2016 13.2  13.0 - 18.0 g/dL Final  . HCT 05/28/2016 38.4* 40.0 - 52.0 % Final  . MCV 05/28/2016 85.6  80.0 - 100.0 fL Final  . MCH 05/28/2016 29.5  26.0 - 34.0 pg Final  . MCHC 05/28/2016 34.4  32.0 - 36.0 g/dL Final  . RDW 05/28/2016 14.7* 11.5 - 14.5 % Final  . Platelets 05/28/2016 360  150 - 440 K/uL Final  . Neutrophils Relative % 05/28/2016 68  % Final  . Neutro Abs 05/28/2016 5.5  1.4 - 6.5 K/uL Final  . Lymphocytes Relative 05/28/2016 21  % Final  . Lymphs Abs 05/28/2016 1.7  1.0 - 3.6 K/uL Final  . Monocytes Relative 05/28/2016 9  % Final  . Monocytes Absolute 05/28/2016 0.7  0.2 - 1.0 K/uL Final  . Eosinophils Relative 05/28/2016 1  % Final  . Eosinophils Absolute 05/28/2016 0.1  0 - 0.7 K/uL Final  . Basophils Relative 05/28/2016 1  % Final  . Basophils Absolute 05/28/2016 0.1  0 - 0.1 K/uL Final  . Sodium 05/28/2016 138  135 - 145 mmol/L Final  . Potassium 05/28/2016 4.3  3.5 - 5.1 mmol/L Final  . Chloride 05/28/2016 105   101 - 111 mmol/L Final  . CO2 05/28/2016 25  22 - 32 mmol/L Final  . Glucose, Bld 05/28/2016 83  65 - 99 mg/dL Final  . BUN 05/28/2016 30* 6 - 20 mg/dL Final  . Creatinine, Ser 05/28/2016 1.06  0.61 - 1.24 mg/dL Final  . Calcium 05/28/2016 9.3  8.9 - 10.3 mg/dL Final  . Total Protein 05/28/2016 8.0  6.5 - 8.1 g/dL Final  . Albumin 05/28/2016 4.5  3.5 - 5.0 g/dL Final  . AST 05/28/2016 35  15 - 41 U/L Final  . ALT 05/28/2016 19  17 - 63 U/L Final  . Alkaline Phosphatase 05/28/2016 88  38 - 126 U/L Final  . Total Bilirubin 05/28/2016 0.7  0.3 - 1.2 mg/dL Final  . GFR calc non Af Amer 05/28/2016 >60  >60 mL/min Final  . GFR calc Af Amer 05/28/2016 >60  >60 mL/min Final   Comment: (NOTE) The eGFR has  been calculated using the CKD EPI equation. This calculation has not been validated in all clinical situations. eGFR's persistently <60 mL/min signify possible Chronic Wood Disease.   . Anion gap 05/28/2016 8  5 - 15 Final  . IgM, Serum 05/28/2016 512* 20 - 172 mg/dL Final   Comment: (NOTE) Performed At: [facility] [place], [place] [id number] Lindon Romp MD WU:[id number]   . Viscosity, Serum 05/28/2016 1.8  1.6 - 1.9 rel.saline Final   Comment: (NOTE) Values above 2.7 may indicate paraproteinemia is present. This test was developed and its performance characteristics determined by [facility]. It has not been cleared or approved by the Food and Drug Administration. Performed At: [facility] [place], [place] [id number] Lindon Romp MD VH:[id number]   . Uric Acid, Serum 05/28/2016 6.0  4.4 - 7.6 mg/dL Final    Assessment:  ZIMIR KITTLESON is a 66 y.o. male with Waldenstrom's macroglobulinemia.  He was diagnosed with Waldenstrm's macroglobulinemia in 05/2010 after presenting with light headedness/dizziness, headache and visual changes.    He was referred to [facility] at [facility].  Bone marrow was performed (no results  available).  He received weekly Rituxan x 4 followed by maintenance Rituxan x 2 years (last 06/2012).  He was admitted to [facility] with dizziness and vertigo in 09/2013.  Hyperviscosity was suspected but not documented.  He underwent plasmapheresis at [facility] x 1 with significant improvement in symptoms.  Serum viscosity was 1.8 (normal) on 01/16/2016.  He received Rituxan re-induction (weekly x 4).  Treatment completed in 10/2013.  He was noted to have a PET negative nodule in 02/2014.  He was started on ibrutinib, but discontinued in 02/2014 secondary to poor tolerance (GI upset).  Maintenance Rituxan was restarted in 04/18/2014 and discontinued in 02/2015.  IgM trend as follows:  4400 on 02/10/2010, 5130 on 04/03/2010, 5180 on 04/28/2010, 3530 on 08/12/2010, 3350 on 10/23/2010, 2600 on 01/02/2011, 2670 on 02/27/2011, 2210 on 05/08/2011, 1610 on 07/01/2011, 652 on 07/01/2012, 793 on 01/04/2013, 863 on 05/30/2013, 546 on 02/25/2015, 482 on 04/29/2015, 522 on 09/11/2015, and 588 on 01/15/2017.  Head MRI on 03/28/2015 revealed no acute intracranial abnormality.  Thoracic spine MRI on 12/15/016 revealed no acute abnormality.  Marrow signal was unremarkable. The central canal and foramina were widely patent at all levels.  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 He has discomfort when gets him up at night.  Legs tingle.  He is able to ambulate.   Symptomatically, he has a headache and dizziness when changing position.  He has some nausea but no vomiting.  Orthostatic assessment is negative. Dix-Hallpike maneuver positive for dizziness and nausea.  No nystagmus was noted.  Exam reveals no adenopathy or hepatosplenomegaly.  Plan: 1.  Labs today: CBC with diff, CMP, IgM, uric acid, serum viscosity. 2.  Follow-up with Dr Manuella Ghazi re: labs (GM1 antibody and anti-MAG antibody). 3.  Suspect benign positional  vertigo.  Discuss last IgM level and serum viscosity.  Doubt related to Waldenstrom's.  If symptoms not self limiting, may require imaging.  Encourage patient to follow-up with Dr. Kary Kos.  4.  RTC in as previously scheduled.    The patient was seen and examined.  The assessment and plan was discussed with the patient.  Multiple questions were asked and answered.   Jacquelin Hawking, NP  Lequita Asal, MD 05/28/2016

## 2016-05-29 ENCOUNTER — Telehealth: Payer: Self-pay | Admitting: *Deleted

## 2016-05-29 LAB — VISCOSITY, SERUM: Viscosity, Serum: 1.8 rel.saline (ref 1.6–1.9)

## 2016-05-29 LAB — IGM: IgM, Serum: 512 mg/dL — ABNORMAL HIGH (ref 20–172)

## 2016-05-29 NOTE — Telephone Encounter (Signed)
Called and spoke with patient's wife, informed her of the IGM results per Dr. Mike Gip, voiced understanding.

## 2016-06-19 ENCOUNTER — Ambulatory Visit: Payer: Medicare Other | Attending: Family Medicine | Admitting: Physical Therapy

## 2016-06-19 ENCOUNTER — Encounter: Payer: Self-pay | Admitting: Physical Therapy

## 2016-06-19 DIAGNOSIS — R42 Dizziness and giddiness: Secondary | ICD-10-CM | POA: Diagnosis not present

## 2016-06-19 NOTE — Therapy (Signed)
[place] MAIN [facility] SERVICES [address] [place], [place], [address] Phone: [phone]   Fax:  [phone]  Physical Therapy Evaluation  Patient Details  Name: [name] MRN: [id number] Date of Birth: 02-23-1951 Referring Provider: Dr. Kary Kos  Encounter Date: 06/19/2016      PT End of Session - 06/19/16 5035    Visit Number 1   Number of Visits 11   Date for PT Re-Evaluation 08/14/16   Authorization Type Needs G codes Visit 1/10   PT Start Time 0825   PT Stop Time 0924   PT Time Calculation (min) 59 min   Equipment Utilized During Treatment Gait belt   Activity Tolerance Patient tolerated treatment well  mild nausea at times but eased with rest break   Behavior During Therapy [facility] for tasks assessed/performed      Past Medical History:  Diagnosis Date  . Anemia    prev bone marrow bx 02/11/10  . Anxiety   . Bone cancer ([place])   . Colitis 2004  . Depression   . GERD (gastroesophageal reflux disease)   . Need for prophylactic vaccination and inoculation against influenza 01/31/2013  . Sleep apnea 09-12-10   Uses CPAP  . 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[facility])     Past Surgical History:  Procedure Laterality Date  . COLONOSCOPY  03/17/2011   Procedure: COLONOSCOPY;  Surgeon: Jeryl Columbia, MD;  Location: [facility];  Service: Endoscopy;  Laterality: N/A;  . HIP FRACTURE SURGERY  January 2013   R hip  . HIP FRACTURE SURGERY Right 02/2016  . NASAL FRACTURE SURGERY      There were no vitals filed for this visit.       Subjective Assessment - 06/19/16 4656    Subjective Patient states that he is careful with his movements because he does not want to bring on the dizziness.    Pertinent History Patient states that his dizziness symptoms first started in 2012. Patient was diagnosed with 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and states he was having vertigo badly at that time especially when he would bend over. Patient reports  that when they started to treat him with chemotherapy for the 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his vertigo significantly decreased. Patient reports his symptoms of vertigo began to return about 5 months ago. Patient reports he gets dizziness at least once a day. Patient reports he has been having headaches as well. Patient states that he frequently wakes up with headaches and that he has had 2 or 3 headaches this week. Patient reports that he had a brain MRI performed last year and patient stated "everything looks good". Patient states he was seen by an ENT physician several years ago and states they did not find anything per patient report. Patient states that if he lies down under a car he has to lie there with his eyes closed and he gets nauseated. Patient states the same thing happens when he goes to lie down in bed. Patient reports vertigo and states that he keeps his eyes closed, stays still and it goes away in a minute or so.    Patient Stated Goals Patient would like to be able to work on his car without dizziness symptoms.    Currently in Pain? --  none stated            [facility] PT Assessment - 06/19/16 0930      Assessment   Medical Diagnosis dizziness   Referring Provider Dr. Kary Kos   Onset Date/Surgical Date 01/12/16  Hand Dominance Right   Prior Therapy no vestibular therapy has had PT after his hip surgeries     Precautions   Precautions Fall   Precaution Comments patient reports that he does not have any hip precautions now except that he sleeps with a pillow between his legs     Restrictions   Weight Bearing Restrictions No     Balance Screen   Has the patient fallen in the past 6 months No   Has the patient had a decrease in activity level because of a fear of falling?  Yes   Is the patient reluctant to leave their home because of a fear of falling?  No     Home Ecologist residence   Living Arrangements Spouse/significant other    Available Help at Discharge Family;Friend(s)   Type of [place] to enter   Entrance Stairs-Number of Steps [place] One level   [place] - 2 wheels;Cane - single point;Wheelchair - manual;Toilet riser;Bedside commode     Prior Function   Level of Independence Independent with community mobility without device   Vocation Retired     Charity fundraiser Status Within Functional Limits for tasks assessed     Standardized Balance Assessment   Standardized Balance Assessment Dynamic Gait Index     Dynamic Gait Index   Level Surface Normal   Change in Gait Speed Normal   Gait with Horizontal Head Turns Mild Impairment   Gait with Vertical Head Turns Moderate Impairment   Gait and Pivot Turn Moderate Impairment   Step Over Obstacle Normal   Step Around Obstacles Normal   Steps Moderate Impairment   Total Score 17   DGI comment: note pt performed steps wiht step to pattern due to his hip surgery in 2017       VESTIBULAR AND BALANCE EVALUATION  Onset Date: 5 months ago  HISTORY:  Subjective history of current problem:  Patient states that his dizziness symptoms first started in 2012. Patient was diagnosed with 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and states he was having vertigo badly at that time especially when he would bend over. Patient reports that when they started to treat him with chemotherapy for the 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his vertigo significantly decreased. Patient reports his symptoms of vertigo began to return about 5 months ago. Patient reports he gets dizziness at least once a day. Patient reports he has been having headaches as well. Patient states that he frequently wakes up with headaches and that he has had 2 or 3 headaches this week. Patient reports that he had a brain MRI performed last year and patient stated "everything looks good". Patient states he was seen by an ENT physician  several years ago and states they did not find anything per patient report. Patient states that if he lies down under a car he has to lie there with his eyes closed and he gets nauseated. Patient states the same thing happens when he goes to lie down in bed. Patient reports vertigo and states that he keeps his eyes closed, stays still and it goes away in a minute or so. Patient reports that his physician told him not to drive at this time secondary to his dizziness symptoms.  Description of dizziness: vertigo, unsteadiness, lightheadedness, general unsteadiness, and infrequent aural fullness Frequency: daily Duration: minutes Symptom nature: (motion provoked/positional/spontaneous/constant, variable, intermittent)    Provocative Factors:  lying down in bed, turning head, rolling especially to the left, getting under cars to work on cars, quick turns Easing Factors: lying still and closing eyes  Progression of symptoms: better History of similar episodes: yes  Falls (yes/no): no Number of falls in past 6 months: patient reports he has had 5 or less near miss falls but no falls.  Prior Functional Level: Patient is prior independent community ambulator and driving.  Auditory complaints (tinnitus, pain, drainage): denies Vision (last eye exam, diplopia, recent changes): patient reports he gets blurry vision sometimes when he experiences the dizziness. Patient states he had his eyes examined at the end of last year.   Current Symptoms: (vertigo, N & V, dysarthria, dysphagia, drop attacks, bowel and bladder changes, recent weight loss/gain, lightheadedness, headache, rocking, general unsteadiness, imbalance, tilting, swaying, veering, dizzy, oscillopsia, migraines) Review of systems negative for red flags.   EXAMINATION  POSTURE:  Fair erect sitting posture with slightly rounded shoulders.       COORDINATION: Finger to Nose:    Normal Past Pointing:    Normal  MUSCULOSKELETAL  SCREEN: Cervical Spine ROM: AROM cervical spine WNL flexion, extension and left/right rotation. Patient noted to have dizziness with looking upward.   Functional Mobility: Patient independent with bed mobility and transfers.   Gait: Patient ambulates to clinic without AD with fair cadence. However, patient did experience loss of balance with head turns and body turns. Patient performed ascending steps without UEs support with step over step pattern but descended steps with step to gait pattern secondary to hip surgery in November 2017.  Scanning of visual environment with gait is: fair  Balance: Patient demonstrates increased sway with eyes closed on firm and foam surfaces. Patient unsteady with ambulation with head and body turns.    POSTURAL CONTROL TESTS:   Clinical Test of Sensory Interaction for Balance    (CTSIB):  CONDITION TIME STRATEGY SWAY  Eyes open, firm surface 30 seconds ankle   Eyes closed, firm surface 30 seconds ankle +1  Eyes open, foam surface 30 seconds ankle   Eyes closed, foam surface 30 seconds ankle +2    OCULOMOTOR / VESTIBULAR TESTING:  Oculomotor Exam- Room Light  Normal Abnormal Comments  Ocular Alignment N    Ocular ROM N    Spontaneous Nystagmus N    End-Gaze Nystagmus N    Smooth Pursuit N  Patient reports mild dizziness with testing; no saccadic movement or nystagmus noted  Saccades N  Patient reports mild dizziness with superior/inferior saccades testing; patient able to perform saccades with good accuracy and speed  VOR     VOR Cancellation  Abn Patient reports mild blurrying and nauseous  Left Head Thrust   Deferred secondary to positive left Dix-Hallpike testing this date  Right Head Thrust   Deferred secondary to positive left Dix-Hallpike testing this date  Head Shaking Nystagmus   Deferred secondary to positive left Dix-Hallpike testing this date    BPPV TESTS:  Symptoms Duration Intensity Nystagmus  L Dix-Hallpike Vertgio; nausea Less  than 1 minute 10/10 upbeating left torsional of short duration  R Dix-Hallpike negative  0/10 No nystagmus observed  L Head Roll deferred     R Head Roll deferred       FUNCTIONAL OUTCOME MEASURES:  Results Comments  DHI 36 Low perception of handicap  ABC Scale 64.3% Falls risk; in need of intervention  DGI 17/24 Falls risk; in need of intervention     Canalith Repositioning:  On inverted  mat table performed right Dix-Hallpike testing and it was negative with no nystagmus observed and patient denied dizziness or vertigo. Performed left Dix-Hallpike test and it was positive with left upbeating torsional nystagmus of short duration. Patient with about 5 second latency and then observed multiple beats of nystagmus. Performed left canalith repositioning maneuver (CRT). Repeated left CRT for a total of 2 maneuvers with retesting between maneuvers. Patient reports 10/10 vertigo with first CRT.       PT Education - 07/03/2016 1224    Education provided Yes   Education Details Dix-hallpike testing and BPPV; handout on BPPV from [email or web address].org; showed You Tube video of R BPPV nystagmus   Person(s) Educated Patient   Methods Explanation;Demonstration;Handout   Comprehension Verbalized understanding             PT Long Term Goals - 07-03-16 8250      PT LONG TERM GOAL #1   Title Patient will be able to work on cars without symptoms of dizziness and nausea in order to be able to do his hobby by 08/14/2016.   Time 8   Period Weeks   Status New     PT LONG TERM GOAL #2   Title Patient will demonstrate reduced falls risk as evidenced by Dynamic Gait Index (DGI) >19/24.   Baseline scored 17/24 on 07-03-2016   Time 8   Period Weeks   Status New     PT LONG TERM GOAL #3   Title Patient will have demonstrate decreased falls risk as indicated by Activities Specific Balance Confidence Scale score of 80% or greater.   Baseline scored 64.3% on July 03, 2016   Time 8   Period Weeks   Status New      PT LONG TERM GOAL #4   Title Patient will report 50% or greater improvement in his symptoms of dizziness and imbalance with provoking motions or positions by 08/14/2016.             Plan - 07/03/16 0370    Clinical Impression Statement Patient presents with reports of intermittent, short bouts of vertigo brought on by position changes. Patient with positive left Dix-hallpike test which could be consistent with L BPPV and was treated with canalith repositioning maneuver. Patient is at risk for falls as indicated by DGI score of 17/24 and ABC scale score of 64.3% this date. Patient with listed functional deficits and would benefit from PT services to address goals, to decrease falls risk and to reduce dizziness symptoms.     Rehab Potential Good   Clinical Impairments Affecting Rehab Potential Positive Indicators: motivated, family support  Negative Indicators: 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PT Frequency --  2 x/week for first 1-2 weeks until BPPV resolved and then 1X/week afterwards   PT Duration 8 weeks   PT Treatment/Interventions Vestibular;Canalith Repostioning;Stair training;Gait training;Therapeutic exercise;Balance training;Therapeutic activities;Functional mobility training;Neuromuscular re-education;Patient/family education   PT Next Visit Plan Repeat left CRT if indicated   Consulted and Agree with Plan of Care Patient      Patient will benefit from skilled therapeutic intervention in order to improve the following deficits and impairments:  Dizziness, Decreased balance, Decreased mobility  Visit Diagnosis: Dizziness and giddiness      G-Codes - 2016/07/03 4888    Functional Assessment Tool Used (Outpatient Only) clinical judgment, DGI, DHI, ABC scale   Functional Limitation Mobility: Walking and moving around   Mobility: Walking and Moving Around Current Status (B1694) At least 20 percent but less than 40 percent impaired, limited or  restricted   Mobility: Walking and  Moving Around Goal Status [phone]) At least 1 percent but less than 20 percent impaired, limited or restricted       Problem List Patient Active Problem List   Diagnosis Date Noted  . Abnormal CAT scan 09/17/2013  . Need for prophylactic vaccination and inoculation against influenza 01/31/2013  . 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[place])   . GERD (gastroesophageal reflux disease)    Lady Deutscher PT, DPT Dola Lunsford 06/19/2016, 2:50 PM  [place] MAIN [facility] SERVICES [address] [place], [place], [address] Phone: [phone]   Fax:  [phone]  Name: [name] MRN: [id number] Date of Birth: 12/16/1950

## 2016-06-23 ENCOUNTER — Ambulatory Visit: Payer: Medicare Other | Admitting: Physical Therapy

## 2016-06-23 ENCOUNTER — Encounter: Payer: Self-pay | Admitting: Physical Therapy

## 2016-06-23 DIAGNOSIS — R42 Dizziness and giddiness: Secondary | ICD-10-CM | POA: Diagnosis not present

## 2016-06-23 NOTE — Therapy (Signed)
[place] MAIN [facility] SERVICES [address] [place], [place], [address] Phone: [phone]   Fax:  [phone]  Physical Therapy Treatment  Patient Details  Name: [name] MRN: [id number] Date of Birth: [birth year] Referring Provider: Dr. Kary Kos  Encounter Date: 06/23/2016      PT End of Session - 06/23/16 1013    Visit Number 2   Number of Visits 11   Date for PT Re-Evaluation 08/14/16   Authorization Type Needs G codes Visit 2022/06/07   PT Start Time 1010   PT Stop Time 1048   PT Time Calculation (min) 38 min   Equipment Utilized During Treatment Gait belt   Activity Tolerance Patient tolerated treatment well  mild nausea at times but eased with rest break   Behavior During Therapy [facility] for tasks assessed/performed      Past Medical History:  Diagnosis Date  . Anemia    prev bone marrow bx 02/11/10  . Anxiety   . Bone cancer ([place])   . Colitis 2004  . Depression   . GERD (gastroesophageal reflux disease)   . Need for prophylactic vaccination and inoculation against influenza 01/31/2013  . Sleep apnea 09-12-10   Uses CPAP  . 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[facility])     Past Surgical History:  Procedure Laterality Date  . COLONOSCOPY  03/17/2011   Procedure: COLONOSCOPY;  Surgeon: Jeryl Columbia, MD;  [facility]: [facility];  Service: Endoscopy;  Laterality: N/A;  . HIP FRACTURE SURGERY  January 2013   R hip  . HIP FRACTURE SURGERY Right 02/2016  . NASAL FRACTURE SURGERY      There were no vitals filed for this visit.      Subjective Assessment - 06/23/16 1012    Subjective Patient asks is it possible to have things fixed after that first visit. Patient states that he has not had any episodes of dizziness since the evaluation.    Pertinent History Patient states that his dizziness symptoms first started in 2012. Patient was diagnosed with 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and states he was having vertigo badly at that time  especially when he would bend over. Patient reports that when they started to treat him with chemotherapy for the 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his vertigo significantly decreased. Patient reports his symptoms of vertigo began to return about 5 months ago. Patient reports he gets dizziness at least once a day. Patient reports he has been having headaches as well. Patient states that he frequently wakes up with headaches and that he has had 2 or 3 headaches this week. Patient reports that he had a brain MRI performed last year and patient stated "everything looks good". Patient states he was seen by an ENT physician several years ago and states they did not find anything per patient report. Patient states that if he lies down under a car he has to lie there with his eyes closed and he gets nauseated. Patient states the same thing happens when he goes to lie down in bed. Patient reports vertigo and states that he keeps his eyes closed, stays still and it goes away in a minute or so.    Patient Stated Goals Patient would like to be able to work on his car without dizziness symptoms.    Currently in Pain? Yes   Pain Score 2    Pain Location Head   Pain Descriptors / Indicators Headache       Canalith Repositioning:  On mat table performed, left Dix-Hallpike testing and it  was negative with no nystagmus observed and patient denied vertigo, but did report 1/10 dizziness that lasted less than 5 seconds.  Performed left canalith repositioning maneuver (CRT). Repeated left CRT for a total of 2 maneuvers. Patient reported 0/10 with second CRT.  Note: After canalith repositioning, performed neuromuscular re-education.  Neuromuscular Re-education: Demonstrated and educated as to self-Epley maneuver and then patient demonstrated one rep in the clinic with a few verbal cues for technique. Patient denied dizziness throughout.  Patient performed DGI and scored 22/24. Patient scored a 1 for the ascend/descend  stairs item secondary to hip surgery in Nov 2017 as he was instructed to perform descent with step to gait pattern. Patient did not need to use rails for support and demonstrated no LOB.       [facility] PT Assessment - 06/23/16 1034      Dynamic Gait Index   Level Surface Normal   Change in Gait Speed Normal   Gait with Horizontal Head Turns Normal   Gait with Vertical Head Turns Normal   Gait and Pivot Turn Normal   Step Over Obstacle Normal   Step Around Obstacles Normal   Steps Moderate Impairment   Total Score 22                             PT Education - 06/23/16 1013    Education provided Yes   Education Details self-Epley maneuver and hand-out provided   Person(s) Educated Patient   Methods Explanation;Demonstration;Handout;Verbal cues   Comprehension Verbalized understanding;Returned demonstration             PT Long Term Goals - 06/19/16 0823      PT LONG TERM GOAL #1   Title Patient will be able to work on cars without symptoms of dizziness and nausea in order to be able to do his hobby by 08/14/2016.   Time 8   Period Weeks   Status New     PT LONG TERM GOAL #2   Title Patient will demonstrate reduced falls risk as evidenced by Dynamic Gait Index (DGI) >19/24.   Baseline scored 17/24 on 06/19/2016   Time 8   Period Weeks   Status New     PT LONG TERM GOAL #3   Title Patient will have demonstrate decreased falls risk as indicated by Activities Specific Balance Confidence Scale score of 80% or greater.   Baseline scored 64.3% on 06/19/2016   Time 8   Period Weeks   Status New     PT LONG TERM GOAL #4   Title Patient will report 50% or greater improvement in his symptoms of dizziness and imbalance with provoking motions or positions by 08/14/2016.               Plan - 06/23/16 1013    Clinical Impression Statement Patient reports that he is feeling much better and has not had any episodes of dizziness since last visit. Patient  reports that he has not tried working on cars yet but states he will try before Friday to see if that brings on his dizziness again. Patient responded very well to canalith repositioning mauever and no nystagmus was observed this date. Patient scored 22/24 on the DGI this date which is significantly improved compared to his inital score of 17/24 on 06/19/2016. Patient to return to clinic on this Friday and if he continues to have no further episodes of vertigo will consider discharge if goals  are met. Will plan on having patient demonstrates self-Epley again next visit.    Rehab Potential Good   Clinical Impairments Affecting Rehab Potential Positive Indicators: motivated, family support  Negative Indicators: 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PT Frequency --  2 x/week for first 1-2 weeks until BPPV resolved and then 1X/week afterwards   PT Duration 8 weeks   PT Treatment/Interventions Vestibular;Canalith Repostioning;Stair training;Gait training;Therapeutic exercise;Balance training;Therapeutic activities;Functional mobility training;Neuromuscular re-education;Patient/family education   PT Next Visit Plan Repeat left CRT if indicated and have patient demonstrate self-Epley   PT Home Exercise Plan self-Epley if needed    Consulted and Agree with Plan of Care Patient      Patient will benefit from skilled therapeutic intervention in order to improve the following deficits and impairments:  Dizziness, Decreased balance, Decreased mobility  Visit Diagnosis: Dizziness and giddiness     Problem List Patient Active Problem List   Diagnosis Date Noted  . Abnormal CAT scan 09/17/2013  . Need for prophylactic vaccination and inoculation against influenza 01/31/2013  . 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[place])   . GERD (gastroesophageal reflux disease)     Carollee Nussbaumer 06/23/2016, 10:59 AM  [place] MAIN [facility] SERVICES [address] [place], [place],  [address] Phone: [phone]   Fax:  [phone]  Name: [name] MRN: [id number] Date of Birth: 07/23/1950

## 2016-06-26 ENCOUNTER — Ambulatory Visit: Payer: Medicare Other | Admitting: Physical Therapy

## 2016-06-26 ENCOUNTER — Encounter: Payer: Self-pay | Admitting: Physical Therapy

## 2016-06-26 DIAGNOSIS — R42 Dizziness and giddiness: Secondary | ICD-10-CM

## 2016-06-26 NOTE — Therapy (Signed)
[place] MAIN [facility] SERVICES [address] [place], [place], [address] Phone: [phone]   Fax:  [phone]  Physical Therapy Treatment/ Discharge Summary  Patient Details  Name: [name] MRN: [id number] Date of Birth: Jan 23, 1951 Referring Provider: Dr. Kary Kos  Encounter Date: 06/26/2016       PT End of Session - 06/26/16 0924    Visit Number 3   Number of Visits 11   Date for PT Re-Evaluation 08/14/16   Authorization Type Needs G codes Visit 28-Jun-2022   PT Start Time 0923   PT Stop Time 0946   PT Time Calculation (min) 23 min   Equipment Utilized During Treatment --   Activity Tolerance Patient tolerated treatment well  mild nausea at times but eased with rest break   Behavior During Therapy [facility] for tasks assessed/performed      Past Medical History:  Diagnosis Date  . Anemia    prev bone marrow bx 02/11/10  . Anxiety   . Bone cancer ([place])   . Colitis 2004  . Depression   . GERD (gastroesophageal reflux disease)   . Need for prophylactic vaccination and inoculation against influenza 01/31/2013  . Sleep apnea 09-12-10   Uses CPAP  . 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[facility])     Past Surgical History:  Procedure Laterality Date  . COLONOSCOPY  03/17/2011   Procedure: COLONOSCOPY;  Surgeon: Jeryl Columbia, MD;  Location: [facility] ENDOSCOPY;  Service: Endoscopy;  Laterality: N/A;  . HIP FRACTURE SURGERY  January 2013   R hip  . HIP FRACTURE SURGERY Right 02/2016  . NASAL FRACTURE SURGERY      There were no vitals filed for this visit.      Subjective Assessment - 06/26/16 0923    Subjective Patient states he was able to drive his tractor and work on his land without issues. Patient states he was able to change his daughter's tire and he did not have any episodes of dizziness. Patient states he has not had any episodes of vertigo since being treated with the Epley maneuvers in the clinic.    Pertinent History Patient states that  his dizziness symptoms first started in 2012. Patient was diagnosed with 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and states he was having vertigo badly at that time especially when he would bend over. Patient reports that when they started to treat him with chemotherapy for the 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his vertigo significantly decreased. Patient reports his symptoms of vertigo began to return about 5 months ago. Patient reports he gets dizziness at least once a day. Patient reports he has been having headaches as well. Patient states that he frequently wakes up with headaches and that he has had 2 or 3 headaches this week. Patient reports that he had a brain MRI performed last year and patient stated "everything looks good". Patient states he was seen by an ENT physician several years ago and states they did not find anything per patient report. Patient states that if he lies down under a car he has to lie there with his eyes closed and he gets nauseated. Patient states the same thing happens when he goes to lie down in bed. Patient reports vertigo and states that he keeps his eyes closed, stays still and it goes away in a minute or so.    Patient Stated Goals Patient would like to be able to work on his car without dizziness symptoms.    Currently in Pain? No/denies      Neuromuscular  Re-education:  Patient reporting resumption of his prior activities without vertigo, dizziness and imbalance symptoms.  Repeated left and right Dix-Hallpike tests and patient denies vertigo and no nystagmus observed.  Patient performed left self-Epley maneuver over pillow on mat table. He required one cue to not lift his head with turning head from left to right but otherwise demonstrated good technique.  Patient reports that he has his self-Epley handout for home if needed. Patient completed ABC scale form. Patient scored 94.8% which is significantly improved as he scored 64% at the time of initial evaluation. Patient  reports that he feels 85-90% improvement in his symptoms overall since initiating vestibular rehab. Patient states he has been able to resume all of his prior activities without difficulty. Discussed progress towards goals and discharge plans. Patient states he feels confident with being able to do self-Epley if his symptoms return and patient states he will call the main office if he feels he needs to be seen again in the future for vestibular therapy.          PT Education - 06/26/16 0924    Education provided Yes   Education Details reviewed self-Epley   Person(s) Educated Patient   Methods Explanation;Demonstration   Comprehension Verbalized understanding             PT Long Term Goals - 06/26/16 0931      PT LONG TERM GOAL #1   Title Patient will be able to work on cars without symptoms of dizziness and nausea in order to be able to do his hobby by 08/14/2016.   Time 8   Period Weeks   Status Achieved     PT LONG TERM GOAL #2   Title Patient will demonstrate reduced falls risk as evidenced by Dynamic Gait Index (DGI) >19/24.   Baseline scored 17/24 on 06/19/2016   Time 8   Period Weeks   Status Achieved     PT LONG TERM GOAL #3   Title Patient will have demonstrate decreased falls risk as indicated by Activities Specific Balance Confidence Scale score of 80% or greater.   Baseline scored 64.3% on 06/19/2016   Time 8   Period Weeks   Status Achieved     PT LONG TERM GOAL #4   Title Patient will report 50% or greater improvement in his symptoms of dizziness and imbalance with provoking motions or positions by 08/14/2016.   Baseline Patient reports 85-90% improvement in his symptoms overall on 06/26/2016   Time 8   Period Weeks   Status Achieved               Plan - 06/26/16 2355    Clinical Impression Statement Patient reports that he has been able to resume his prior activities working on cars, riding tractors and yard work without dizziness and imbalance  symptoms. Patient states that he has not had any further episodes of vertigo since the initial evaluation when Epley maneuvers were performed. Patient improved from 64% to 94.8% on the ABC scale and improved on the DGI from 17/24 to 22/24 on the DGI. Patient has met all goals as set on plan of care. Patient issued and reviewed as to self-Epley if his symptoms should return and educated that patient can call the clinic to be seen again for vestibular therapy if needed as well. Patient in agreement with discharge from PT services at this time.     Rehab Potential Good   Clinical Impairments Affecting Rehab Potential Positive Indicators: motivated,  family support  Negative Indicators: 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PT Frequency --  2 x/week for first 1-2 weeks until BPPV resolved and then 1X/week afterwards   PT Duration 8 weeks   PT Treatment/Interventions Vestibular;Canalith Repostioning;Stair training;Gait training;Therapeutic exercise;Balance training;Therapeutic activities;Functional mobility training;Neuromuscular re-education;Patient/family education   PT Next Visit Plan --   PT Home Exercise Plan self-Epley if needed    Consulted and Agree with Plan of Care Patient      Patient will benefit from skilled therapeutic intervention in order to improve the following deficits and impairments:  Dizziness, Decreased balance, Decreased mobility  Visit Diagnosis: Dizziness and giddiness       G-Codes - 07/14/2016 0945    Functional Assessment Tool Used (Outpatient Only) clinical judgment, DGI, DHI, ABC scale   Functional Limitation Mobility: Walking and moving around   Mobility: Walking and Moving Around Goal Status [phone]) At least 1 percent but less than 20 percent impaired, limited or restricted   Mobility: Walking and Moving Around Discharge Status [phone]) At least 1 percent but less than 20 percent impaired, limited or restricted      Problem List Patient Active Problem List    Diagnosis Date Noted  . Abnormal CAT scan 09/17/2013  . Need for prophylactic vaccination and inoculation against influenza 01/31/2013  . 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[place])   . GERD (gastroesophageal reflux disease)    Lady Deutscher PT, DPT Jaquanda Wickersham 07/14/16, 9:57 AM  [place] MAIN [facility] SERVICES [address] [place], [place], [address] Phone: [phone]   Fax:  [phone]  Name: MAHAMADOU WELTZ MRN: [id number] Date of Birth: Nov 08, 1950

## 2016-06-30 ENCOUNTER — Encounter: Payer: Medicare Other | Admitting: Physical Therapy

## 2016-07-07 ENCOUNTER — Encounter: Payer: Medicare Other | Admitting: Physical Therapy

## 2016-07-14 ENCOUNTER — Encounter: Payer: Medicare Other | Admitting: Physical Therapy

## 2016-07-16 ENCOUNTER — Ambulatory Visit: Payer: Medicare Other | Admitting: Hematology and Oncology

## 2016-07-16 ENCOUNTER — Other Ambulatory Visit: Payer: Medicare Other

## 2016-07-21 ENCOUNTER — Encounter: Payer: Medicare Other | Admitting: Physical Therapy

## 2016-07-23 ENCOUNTER — Other Ambulatory Visit: Payer: Self-pay | Admitting: *Deleted

## 2016-07-23 DIAGNOSIS — C880 Waldenstrom macroglobulinemia: Secondary | ICD-10-CM

## 2016-07-24 ENCOUNTER — Inpatient Hospital Stay: Payer: Medicare Other | Attending: Hematology and Oncology

## 2016-07-24 ENCOUNTER — Encounter: Payer: Self-pay | Admitting: Hematology and Oncology

## 2016-07-24 ENCOUNTER — Inpatient Hospital Stay (HOSPITAL_BASED_OUTPATIENT_CLINIC_OR_DEPARTMENT_OTHER): Payer: Medicare Other | Admitting: Hematology and Oncology

## 2016-07-24 VITALS — BP 117/70 | HR 80 | Temp 97.7°F | Resp 18 | Wt 162.0 lb

## 2016-07-24 DIAGNOSIS — G4730 Sleep apnea, unspecified: Secondary | ICD-10-CM | POA: Diagnosis not present

## 2016-07-24 DIAGNOSIS — Z79899 Other long term (current) drug therapy: Secondary | ICD-10-CM

## 2016-07-24 DIAGNOSIS — Z87891 Personal history of nicotine dependence: Secondary | ICD-10-CM | POA: Diagnosis not present

## 2016-07-24 DIAGNOSIS — C880 Waldenstrom macroglobulinemia: Secondary | ICD-10-CM | POA: Diagnosis present

## 2016-07-24 DIAGNOSIS — F419 Anxiety disorder, unspecified: Secondary | ICD-10-CM | POA: Diagnosis not present

## 2016-07-24 DIAGNOSIS — F329 Major depressive disorder, single episode, unspecified: Secondary | ICD-10-CM | POA: Insufficient documentation

## 2016-07-24 DIAGNOSIS — G608 Other hereditary and idiopathic neuropathies: Secondary | ICD-10-CM | POA: Insufficient documentation

## 2016-07-24 DIAGNOSIS — K219 Gastro-esophageal reflux disease without esophagitis: Secondary | ICD-10-CM | POA: Diagnosis not present

## 2016-07-24 LAB — COMPREHENSIVE METABOLIC PANEL
ALT: 18 U/L (ref 17–63)
AST: 30 U/L (ref 15–41)
Albumin: 4.3 g/dL (ref 3.5–5.0)
Alkaline Phosphatase: 83 U/L (ref 38–126)
Anion gap: 6 (ref 5–15)
BUN: 24 mg/dL — ABNORMAL HIGH (ref 6–20)
CO2: 27 mmol/L (ref 22–32)
Calcium: 8.9 mg/dL (ref 8.9–10.3)
Chloride: 105 mmol/L (ref 101–111)
Creatinine, Ser: 0.81 mg/dL (ref 0.61–1.24)
GFR calc Af Amer: >60 mL/min (ref >60)
GFR calc non Af Amer: >60 mL/min (ref >60)
Glucose, Bld: 99 mg/dL (ref 65–99)
Potassium: 4 mmol/L (ref 3.5–5.1)
Sodium: 138 mmol/L (ref 135–145)
Total Bilirubin: 1 mg/dL (ref 0.3–1.2)
Total Protein: 7.6 g/dL (ref 6.5–8.1)

## 2016-07-24 LAB — CBC WITH DIFFERENTIAL/PLATELET
Basophils Absolute: 0.1 10*3/uL (ref 0–0.1)
Basophils Relative: 1 %
Eosinophils Absolute: 0.1 10*3/uL (ref 0–0.7)
Eosinophils Relative: 1 %
HCT: 38.5 % — ABNORMAL LOW (ref 40.0–52.0)
Hemoglobin: 13.1 g/dL (ref 13.0–18.0)
Lymphocytes Relative: 29 %
Lymphs Abs: 1.3 10*3/uL (ref 1.0–3.6)
MCH: 28.7 pg (ref 26.0–34.0)
MCHC: 34.1 g/dL (ref 32.0–36.0)
MCV: 84.4 fL (ref 80.0–100.0)
Monocytes Absolute: 0.4 10*3/uL (ref 0.2–1.0)
Monocytes Relative: 8 %
Neutro Abs: 2.8 10*3/uL (ref 1.4–6.5)
Neutrophils Relative %: 61 %
Platelets: 386 10*3/uL (ref 150–440)
RBC: 4.57 MIL/uL (ref 4.40–5.90)
RDW: 15.8 % — ABNORMAL HIGH (ref 11.5–14.5)
WBC: 4.6 10*3/uL (ref 3.8–10.6)

## 2016-07-24 NOTE — Progress Notes (Signed)
[facility] day:  07/24/2016  Chief Complaint: [name] is a 66 y.o. male with 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who is seen for 3 month assessment.  HPI:  The patient was last seen in the medical oncology clinic on 04/16/2016.  At that time, he denied any B symptoms.  He had ongoing aching and tingling in his legs.  Exam revealed no adenopathy or hepatosplenomegaly.  Hematocrit was 38.2 with a hemoglobin of 12.6.  Creatinine was 0.89.  M-spike was 0.4 gm/dL.  IgM was 512 on 05/28/2016 and 503 on 07/24/2016.  GM1 antibody and anti-myelin associated glycoprotein (MAG) antibody performed at [facility] labs was negative.  He is scheduled for follow-up with Dr. Manuella Ghazi around 10/10/2016.  Symptomatically, he feels "good".  If he felt "any better I couldn't stand it".  He notes that he still has aching and tingling.  He was recently diagnosed with vertigo.  He denies any fevers, sweats or weight loss.  He denies any bruising or bleeding.  He denies any adenopathy. He has had no interval infections.   Past Medical History:  Diagnosis Date  . Anemia    prev bone marrow bx 02/11/10  . Anxiety   . Bone cancer ([place])   . Colitis 2004  . Depression   . GERD (gastroesophageal reflux disease)   . Need for prophylactic vaccination and inoculation against influenza 01/31/2013  . Sleep apnea 09-12-10   Uses CPAP  . 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[facility])     Past Surgical History:  Procedure Laterality Date  . COLONOSCOPY  03/17/2011   Procedure: COLONOSCOPY;  Surgeon: Jeryl Columbia, MD;  Location: [facility] ENDOSCOPY;  Service: Endoscopy;  Laterality: N/A;  . HIP FRACTURE SURGERY  January 2013   R hip  . HIP FRACTURE SURGERY Right 02/2016  . NASAL FRACTURE SURGERY      History reviewed. No pertinent family history.  Social History:  reports that he quit smoking about 9 years ago. He has never used smokeless tobacco. He reports that he does not drink alcohol or  use drugs.  The patient is accompanied by his wife, Stanton Kidney, today.  Allergies:  Allergies  Allergen Reactions  . Ibrutinib Nausea Only    Current Medications: Current Outpatient Prescriptions  Medication Sig Dispense Refill  . pantoprazole (PROTONIX) 40 MG tablet Take 40 mg by mouth daily.    Marland Kitchen PARoxetine (PAXIL) 40 MG tablet Take 40 mg by mouth every morning.    . Vitamin D, Cholecalciferol, 1000 UNITS TABS Take by mouth.     No current facility-administered medications for this visit.     Review of Systems:  GENERAL:  Feels "good".  No fevers or sweats.  Weight up 5 pounds since last visit. PERFORMANCE STATUS (ECOG):  1 HEENT:  No visual changes, runny nose, sore throat, mouth sores or tenderness. Lungs: No shortness of breath or cough.  No hemoptysis. Cardiac:  No chest pain, palpitations, orthopnea, or PND. GI:  No nausea, vomiting, diarrhea, constipation, melena or hematochezia. GU:  No urgency, frequency, dysuria, or hematuria. Musculoskeletal:  No back pain.  s/p right hip replacement.  No muscle tenderness. Extremities:  Legs ache.  No swelling. Skin:  No rashes or skin changes. Neuro:  Legs tingle.  Vertigo.  No headache, focal weakness, balance or coordination issues. Endocrine:  No diabetes, thyroid issues, hot flashes or night sweats. Psych:  No mood changes, depression or anxiety. Pain:  Leg discomfort. Review of systems:  All other systems reviewed  and found to be negative.  Physical Exam: Blood pressure 117/70, pulse 80, temperature 97.7 F (36.5 C), temperature source Tympanic, resp. rate 18, weight 162 lb (73.5 kg). GENERAL:  Well developed, well nourished, gentleman sitting comfortably in the exam room in no acute distress. MENTAL STATUS:  Alert and oriented to person, place and time. HEAD:  Pearline Cables hair.  Mustache.  Normocephalic, atraumatic, face symmetric, no Cushingoid features. EYES:  Glasses.  Pupils equal round and reactive to light and accomodation.  No  conjunctivitis or scleral icterus. ENT:  Oropharynx clear without lesion.  Tongue normal. Mucous membranes moist.  RESPIRATORY:  Clear to auscultation without rales, wheezes or rhonchi. CARDIOVASCULAR:  Regular rate and rhythm without murmur, rub or gallop. ABDOMEN:  Soft, non-tender, with active bowel sounds, and no hepatosplenomegaly.  No masses. SKIN:  No rashes, ulcers or lesions. EXTREMITIES: No edema, no skin discoloration or tenderness.  No palpable cords. LYMPH NODES: No palpable cervical, supraclavicular, axillary or inguinal adenopathy  PSYCH:  Appropriate.   Appointment on 07/24/2016  Component Date Value Ref Range Status  . WBC 07/24/2016 4.6  3.8 - 10.6 K/uL Final  . RBC 07/24/2016 4.57  4.40 - 5.90 MIL/uL Final  . Hemoglobin 07/24/2016 13.1  13.0 - 18.0 g/dL Final  . HCT 07/24/2016 38.5* 40.0 - 52.0 % Final  . MCV 07/24/2016 84.4  80.0 - 100.0 fL Final  . MCH 07/24/2016 28.7  26.0 - 34.0 pg Final  . MCHC 07/24/2016 34.1  32.0 - 36.0 g/dL Final  . RDW 07/24/2016 15.8* 11.5 - 14.5 % Final  . Platelets 07/24/2016 386  150 - 440 K/uL Final  . Neutrophils Relative % 07/24/2016 61  % Final  . Neutro Abs 07/24/2016 2.8  1.4 - 6.5 K/uL Final  . Lymphocytes Relative 07/24/2016 29  % Final  . Lymphs Abs 07/24/2016 1.3  1.0 - 3.6 K/uL Final  . Monocytes Relative 07/24/2016 8  % Final  . Monocytes Absolute 07/24/2016 0.4  0.2 - 1.0 K/uL Final  . Eosinophils Relative 07/24/2016 1  % Final  . Eosinophils Absolute 07/24/2016 0.1  0 - 0.7 K/uL Final  . Basophils Relative 07/24/2016 1  % Final  . Basophils Absolute 07/24/2016 0.1  0 - 0.1 K/uL Final  . Sodium 07/24/2016 138  135 - 145 mmol/L Final  . Potassium 07/24/2016 4.0  3.5 - 5.1 mmol/L Final  . Chloride 07/24/2016 105  101 - 111 mmol/L Final  . CO2 07/24/2016 27  22 - 32 mmol/L Final  . Glucose, Bld 07/24/2016 99  65 - 99 mg/dL Final  . BUN 07/24/2016 24* 6 - 20 mg/dL Final  . Creatinine, Ser 07/24/2016 0.81  0.61 - 1.24  mg/dL Final  . Calcium 07/24/2016 8.9  8.9 - 10.3 mg/dL Final  . Total Protein 07/24/2016 7.6  6.5 - 8.1 g/dL Final  . Albumin 07/24/2016 4.3  3.5 - 5.0 g/dL Final  . AST 07/24/2016 30  15 - 41 U/L Final  . ALT 07/24/2016 18  17 - 63 U/L Final  . Alkaline Phosphatase 07/24/2016 83  38 - 126 U/L Final  . Total Bilirubin 07/24/2016 1.0  0.3 - 1.2 mg/dL Final  . GFR calc non Af Amer 07/24/2016 >60  >60 mL/min Final  . GFR calc Af Amer 07/24/2016 >60  >60 mL/min Final   Comment: (NOTE) The eGFR has been calculated using the CKD EPI equation. This calculation has not been validated in all clinical situations. eGFR's persistently <60 mL/min signify  possible Chronic Kidney Disease.   Georgiann Hahn gap 07/24/2016 6  5 - 15 Final    Assessment:  NEELY CECENA is a 66 y.o. male with Waldenstrom's macroglobulinemia.  He was diagnosed with Waldenstrm's macroglobulinemia in 05/2010 after presenting with light headedness/dizziness, headache and visual changes,    He was referred to [facility].  Bone marrow was performed (no results available).  He received weekly Rituxan x 4 followed by maintenance Rituxan x 2 years (last 06/2012).  He was admitted to [facility] with dizziness and vertigo in 09/2013.  Hyperviscosity was suspected but not documented.  He underwent plasmapheresis at [facility] x 1 with significant improvement in symptoms.  He received Rituxan re-induction (weekly x 4).  Treatment completed in 10/2013.  He was noted to have a PET negative nodule in 02/2014.  He was started on ibrutinib, but discontinued in 02/2014 secondary to poor tolerance (GI upset).  Maintenance Rituxan was restarted in 04/18/2014 and discontinued in 02/2015.  IgM trend:  4400 on 02/10/2010, 5130 on 04/03/2010, 5180 on 04/28/2010, 3530 on 08/12/2010, 3350 on 10/23/2010, 2600 on 01/02/2011, 2670 on 02/27/2011, 2210 on 05/08/2011, 1610 on 07/01/2011, 652 on 07/01/2012, 793 on 01/04/2013, 863 on 05/30/2013, 546  on 02/25/2015, 482 on 04/29/2015, 522 on 09/11/2015, 588 on 01/16/2016, 512 on 05/28/2016, and 503 on 07/24/2016.  SPEP has been followed (gm/dL): 3.02 on 02/10/2010, 3.47 on 04/03/2010, 2.25 on 0712/2012, /1.35 on 05/08/2011, 0.60 on 01/04/2013, 0.57 on 05/30/2013, 0.5 on 02/25/2015, 0.4 on 03/14/2015, 0.4 on 04/16/2016, and 0.5 on 07/24/2016  Head MRI on 03/28/2015 revealed no acute intracranial abnormality.  Thoracic spine MRI on 12/15/016 revealed no acute abnormality.  Marrow signal was unremarkable. The central canal and foramina were widely patent at all levels.  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  GM1 antibody and anti-myelin associated glycoprotein (MAG) antibodies were negative.   He notes symptoms in legs from the knees down x 1 year. He has discomfort when gets him up at night.  Legs tingle.  He is able to ambulate.   Symptomatically, he feels good.  He has chronic aching and tingling in his legs.  Exam reveals no adenopathy or hepatosplenomegaly.  IgM is stable.  Plan: 1.  Labs today:  CBC with diff, CMP,  SPEP, IgM. 2.  RTC in 3 months for labs (CBC with diff, CMP, SPEP, IgM). 3.  RTC in 6 months for MD assessment and labs (CBC with diff, CMP, SPEP, IgM).   Lequita Asal, MD 07/24/2016, 11:21 AM

## 2016-07-24 NOTE — Progress Notes (Signed)
Patient recently diagnosed with veritgo.  States he had PT and for past 2 weeks he has not had any problems.

## 2016-07-25 LAB — IGM: IgM, Serum: 503 mg/dL — ABNORMAL HIGH (ref 20–172)

## 2016-07-27 DIAGNOSIS — Z86010 Personal history of colonic polyps: Secondary | ICD-10-CM | POA: Insufficient documentation

## 2016-07-27 LAB — PROTEIN ELECTROPHORESIS, SERUM
A/G Ratio: 1.2 (ref 0.7–1.7)
Albumin ELP: 3.7 g/dL (ref 2.9–4.4)
Alpha-1-Globulin: 0.2 g/dL (ref 0.0–0.4)
Alpha-2-Globulin: 0.7 g/dL (ref 0.4–1.0)
Beta Globulin: 1 g/dL (ref 0.7–1.3)
Gamma Globulin: 1.1 g/dL (ref 0.4–1.8)
Globulin, Total: 3 g/dL (ref 2.2–3.9)
M-Spike, %: 0.5 g/dL — ABNORMAL HIGH
Total Protein ELP: 6.7 g/dL (ref 6.0–8.5)

## 2016-07-28 ENCOUNTER — Encounter: Payer: Medicare Other | Admitting: Physical Therapy

## 2016-08-20 ENCOUNTER — Encounter: Payer: Self-pay | Admitting: Hematology and Oncology

## 2016-10-07 ENCOUNTER — Encounter: Payer: Self-pay | Admitting: *Deleted

## 2016-10-07 ENCOUNTER — Ambulatory Visit
Admission: RE | Admit: 2016-10-07 | Discharge: 2016-10-07 | Disposition: A | Discharge status: Home / Self Care | Payer: Medicare Other | Source: Ambulatory Visit | Attending: Unknown Physician Specialty | Admitting: Unknown Physician Specialty

## 2016-10-07 ENCOUNTER — Ambulatory Visit: Payer: Medicare Other | Admitting: Anesthesiology

## 2016-10-07 ENCOUNTER — Encounter
Admission: RE | Disposition: A | Discharge status: Home / Self Care | Payer: Self-pay | Source: Ambulatory Visit | Attending: Unknown Physician Specialty

## 2016-10-07 DIAGNOSIS — K210 Gastro-esophageal reflux disease with esophagitis: Secondary | ICD-10-CM | POA: Diagnosis not present

## 2016-10-07 DIAGNOSIS — K3189 Other diseases of stomach and duodenum: Secondary | ICD-10-CM | POA: Insufficient documentation

## 2016-10-07 DIAGNOSIS — G4730 Sleep apnea, unspecified: Secondary | ICD-10-CM | POA: Insufficient documentation

## 2016-10-07 DIAGNOSIS — R1310 Dysphagia, unspecified: Secondary | ICD-10-CM | POA: Diagnosis not present

## 2016-10-07 DIAGNOSIS — F329 Major depressive disorder, single episode, unspecified: Secondary | ICD-10-CM | POA: Insufficient documentation

## 2016-10-07 DIAGNOSIS — Z79899 Other long term (current) drug therapy: Secondary | ICD-10-CM | POA: Insufficient documentation

## 2016-10-07 DIAGNOSIS — K317 Polyp of stomach and duodenum: Secondary | ICD-10-CM | POA: Insufficient documentation

## 2016-10-07 DIAGNOSIS — K5730 Diverticulosis of large intestine without perforation or abscess without bleeding: Secondary | ICD-10-CM | POA: Insufficient documentation

## 2016-10-07 DIAGNOSIS — D122 Benign neoplasm of ascending colon: Secondary | ICD-10-CM | POA: Diagnosis not present

## 2016-10-07 DIAGNOSIS — K640 First degree hemorrhoids: Secondary | ICD-10-CM | POA: Diagnosis not present

## 2016-10-07 DIAGNOSIS — Z85830 Personal history of malignant neoplasm of bone: Secondary | ICD-10-CM | POA: Insufficient documentation

## 2016-10-07 DIAGNOSIS — Z86010 Personal history of colonic polyps: Secondary | ICD-10-CM | POA: Insufficient documentation

## 2016-10-07 DIAGNOSIS — Z888 Allergy status to other drugs, medicaments and biological substances status: Secondary | ICD-10-CM | POA: Insufficient documentation

## 2016-10-07 DIAGNOSIS — Z1211 Encounter for screening for malignant neoplasm of colon: Secondary | ICD-10-CM | POA: Insufficient documentation

## 2016-10-07 DIAGNOSIS — Z87891 Personal history of nicotine dependence: Secondary | ICD-10-CM | POA: Insufficient documentation

## 2016-10-07 DIAGNOSIS — Z9989 Dependence on other enabling machines and devices: Secondary | ICD-10-CM | POA: Insufficient documentation

## 2016-10-07 DIAGNOSIS — F419 Anxiety disorder, unspecified: Secondary | ICD-10-CM | POA: Insufficient documentation

## 2016-10-07 DIAGNOSIS — Z8579 Personal history of other malignant neoplasms of lymphoid, hematopoietic and related tissues: Secondary | ICD-10-CM | POA: Diagnosis not present

## 2016-10-07 HISTORY — PX: ESOPHAGOGASTRODUODENOSCOPY (EGD) WITH PROPOFOL: SHX5813

## 2016-10-07 HISTORY — PX: COLONOSCOPY WITH PROPOFOL: SHX5780

## 2016-10-07 HISTORY — DX: Polyneuropathy, unspecified: G62.9

## 2016-10-07 HISTORY — DX: Dysphagia, unspecified: R13.10

## 2016-10-07 HISTORY — DX: Polyp of colon: K63.5

## 2016-10-07 SURGERY — ESOPHAGOGASTRODUODENOSCOPY (EGD) WITH PROPOFOL
Anesthesia: General

## 2016-10-07 MED ORDER — GLYCOPYRROLATE 0.2 MG/ML IJ SOLN
INTRAMUSCULAR | Status: DC | PRN
  Administered 2016-10-07: 0.1 mg via INTRAVENOUS

## 2016-10-07 MED ORDER — GLYCOPYRROLATE 0.2 MG/ML IJ SOLN
INTRAMUSCULAR | Status: AC
  Filled 2016-10-07: qty 1

## 2016-10-07 MED ORDER — PROPOFOL 500 MG/50ML IV EMUL
INTRAVENOUS | Status: DC | PRN
Start: 2016-10-07 — End: 2016-10-07
  Administered 2016-10-07: 120 ug/kg/min via INTRAVENOUS

## 2016-10-07 MED ORDER — FENTANYL CITRATE (PF) 100 MCG/2ML IJ SOLN
INTRAMUSCULAR | Status: AC
  Filled 2016-10-07: qty 2

## 2016-10-07 MED ORDER — MIDAZOLAM HCL 2 MG/2ML IJ SOLN
INTRAMUSCULAR | Status: DC | PRN
  Administered 2016-10-07: 2 mg via INTRAVENOUS

## 2016-10-07 MED ORDER — PIPERACILLIN-TAZOBACTAM 3.375 G IVPB 30 MIN
3.3750 g | Freq: Once | INTRAVENOUS | Status: AC
  Administered 2016-10-07: 3.375 g via INTRAVENOUS
  Filled 2016-10-07: qty 50

## 2016-10-07 MED ORDER — PROPOFOL 500 MG/50ML IV EMUL
INTRAVENOUS | Status: AC
  Filled 2016-10-07: qty 50

## 2016-10-07 MED ORDER — LIDOCAINE HCL (CARDIAC) 20 MG/ML IV SOLN
INTRAVENOUS | Status: DC | PRN
  Administered 2016-10-07: 30 mg via INTRAVENOUS

## 2016-10-07 MED ORDER — LIDOCAINE HCL (PF) 2 % IJ SOLN
INTRAMUSCULAR | Status: AC
  Filled 2016-10-07: qty 2

## 2016-10-07 MED ORDER — SODIUM CHLORIDE 0.9 % IV SOLN
INTRAVENOUS | Status: DC
  Administered 2016-10-07: 11:00:00 via INTRAVENOUS

## 2016-10-07 MED ORDER — FENTANYL CITRATE (PF) 100 MCG/2ML IJ SOLN
INTRAMUSCULAR | Status: DC | PRN
  Administered 2016-10-07: 50 ug via INTRAVENOUS

## 2016-10-07 MED ORDER — MIDAZOLAM HCL 2 MG/2ML IJ SOLN
INTRAMUSCULAR | Status: AC
  Filled 2016-10-07: qty 2

## 2016-10-07 MED ORDER — SODIUM CHLORIDE 0.9 % IV SOLN: INTRAVENOUS | Status: DC

## 2016-10-07 NOTE — Anesthesia Postprocedure Evaluation (Signed)
Anesthesia Post Note  Patient: [name]  Procedure(s) Performed: Procedure(s) (LRB): ESOPHAGOGASTRODUODENOSCOPY (EGD) WITH PROPOFOL (N/A) COLONOSCOPY WITH PROPOFOL (N/A)  Patient location during evaluation: Endoscopy Anesthesia Type: General Level of consciousness: awake and alert and oriented Pain management: pain level controlled Vital Signs Assessment: post-procedure vital signs reviewed and stable Respiratory status: spontaneous breathing, nonlabored ventilation and respiratory function stable Cardiovascular status: blood pressure returned to baseline and stable Postop Assessment: no signs of nausea or vomiting Anesthetic complications: no     Last Vitals:  Vitals:   10/07/16 1315 10/07/16 1325  BP: 110/78   Pulse: 91 83  Resp: (!) 22 18  Temp:      Last Pain:  Vitals:   10/07/16 1255  TempSrc: Tympanic                 [name]

## 2016-10-07 NOTE — Op Note (Signed)
[facility] Gastroenterology Patient Name: [name] Procedure Date: 10/07/2016 12:07 PM MRN: [id number] Account #: [id number] Date of Birth: 1951/03/02 Admit Type: Outpatient Age: 66 Room: [facility] ENDO ROOM 3 Gender: Male Note Status: Finalized Procedure:            Upper GI endoscopy Indications:          Dysphagia, Heartburn, Suspected gastro-esophageal                        reflux disease Providers:            Manya Silvas, MD Referring MD:         Irven Easterly. Kary Kos, MD (Referring MD) Medicines:            Propofol per Anesthesia Complications:        No immediate complications. Procedure:            Pre-Anesthesia Assessment:                       - After reviewing the risks and benefits, the patient                        was deemed in satisfactory condition to undergo the                        procedure.                       After obtaining informed consent, the endoscope was                        passed under direct vision. Throughout the procedure,                        the patient's blood pressure, pulse, and oxygen                        saturations were monitored continuously. The Endoscope                        was introduced through the mouth, and advanced to the                        second part of duodenum. The upper GI endoscopy was                        accomplished without difficulty. The patient tolerated                        the procedure well. Findings:      LA Grade A (one or more mucosal breaks less than 5 mm, not extending       between tops of 2 mucosal folds) esophagitis with no bleeding was found       40 cm from the incisors. Biopsies were taken with a cold forceps for       histology.      A few dispersed, small non-bleeding erosions were found in the gastric       body. There were no stigmata of recent bleeding. Biopsies were taken       with a cold forceps for histology. Biopsies were taken with  a cold   forceps for Helicobacter pylori testing.      Two medium sessile polyps with no bleeding and no stigmata of recent       bleeding were found on the lesser curvature of the gastric antrum.       Biopsies were taken with a cold forceps for histology.      The examined duodenum was normal.      Due to history of dysphagia I passed a guide wire and then passed a 48 F       dilator over the wire with lubrication. Impression:           - LA Grade A reflux esophagitis. Rule out Barrett's                        esophagus. Biopsied.                       - Non-bleeding erosive gastropathy. Biopsied.                       - Two gastric polyps. Biopsied.                       - Normal examined duodenum. Recommendation:       - Await pathology results. Manya Silvas, MD 10/07/2016 12:33:43 PM This report has been signed electronically. Number of Addenda: 0 Note Initiated On: 10/07/2016 12:07 PM      [facility]

## 2016-10-07 NOTE — Transfer of Care (Signed)
Immediate Anesthesia Transfer of Care Note  Patient: [name]  Procedure(s) Performed: Procedure(s): ESOPHAGOGASTRODUODENOSCOPY (EGD) WITH PROPOFOL (N/A) COLONOSCOPY WITH PROPOFOL (N/A)  Patient Location: PACU  Anesthesia Type:General  Level of Consciousness: awake and sedated  Airway & Oxygen Therapy: Patient Spontanous Breathing and Patient connected to nasal cannula oxygen  Post-op Assessment: Report given to RN and Post -op Vital signs reviewed and stable  Post vital signs: Reviewed and stable  Last Vitals:  Vitals:   10/07/16 1029  BP: 127/78  Pulse: 92  Resp: 18  Temp: (!) 35.8 C    Last Pain:  Vitals:   10/07/16 1029  TempSrc: Tympanic         Complications: No apparent anesthesia complications

## 2016-10-07 NOTE — Anesthesia Preprocedure Evaluation (Signed)
Anesthesia Evaluation  Patient identified by MRN, date of birth, ID band Patient awake    Reviewed: Allergy & Precautions, NPO status , Patient's Chart, lab work & pertinent test results  History of Anesthesia Complications Negative for: history of anesthetic complications  Airway Mallampati: II  TM Distance: >3 FB Neck ROM: Full    Dental no notable dental hx.    Pulmonary sleep apnea and Continuous Positive Airway Pressure Ventilation , neg COPD, former smoker,    breath sounds clear to auscultation- rhonchi (-) wheezing      Cardiovascular Exercise Tolerance: Good (-) hypertension(-) CAD, (-) Past MI and (-) Cardiac Stents  Rhythm:Regular Rate:Normal - Systolic murmurs and - Diastolic murmurs    Neuro/Psych PSYCHIATRIC DISORDERS Anxiety Depression negative neurological ROS     GI/Hepatic Neg liver ROS, GERD  ,  Endo/Other  negative endocrine ROSneg diabetes  Renal/GU negative Renal ROS     Musculoskeletal negative musculoskeletal ROS (+)   Abdominal (+) - obese,   Peds  Hematology  (+) anemia ,   Anesthesia Other Findings Past Medical History: No date: Anemia     Comment: prev bone marrow bx 02/11/10 No date: Anxiety No date: Bone cancer [facility]) 2004: Colitis No date: Colon polyps No date: Depression No date: Dysphagia No date: GERD (gastroesophageal reflux disease) 01/31/2013: Need for prophylactic vaccination and inoculat* No date: Neuropathy 09-12-10: Sleep apnea     Comment: Uses CPAP No date: 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HCC) No date: 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HCC)   Reproductive/Obstetrics                             Anesthesia Physical Anesthesia Plan  ASA: II  Anesthesia Plan: General   Post-op Pain Management:    Induction: Intravenous  PONV Risk Score and Plan: 1 and Propofol  Airway Management Planned: Natural Airway  Additional Equipment:    Intra-op Plan:   Post-operative Plan:   Informed Consent: I have reviewed the patients History and Physical, chart, labs and discussed the procedure including the risks, benefits and alternatives for the proposed anesthesia with the patient or authorized representative who has indicated his/her understanding and acceptance.   Dental advisory given  Plan Discussed with: CRNA and Anesthesiologist  Anesthesia Plan Comments:         Anesthesia Quick Evaluation

## 2016-10-07 NOTE — Anesthesia Post-op Follow-up Note (Cosign Needed)
Anesthesia QCDR form completed.        

## 2016-10-07 NOTE — Anesthesia Procedure Notes (Signed)
Performed by: COOK-MARTIN, Akio Hudnall Pre-anesthesia Checklist: Patient identified, Emergency Drugs available, Suction available, Patient being monitored and Timeout performed Patient Re-evaluated:Patient Re-evaluated prior to inductionOxygen Delivery Method: Nasal cannula Preoxygenation: Pre-oxygenation with 100% oxygen Intubation Type: IV induction Airway Equipment and Method: Bite block Placement Confirmation: positive ETCO2 and CO2 detector     

## 2016-10-07 NOTE — H&P (Signed)
   Primary Care Physician:  Maryland Pink, MD Primary Gastroenterologist:  Dr. Vira Agar  Pre-Procedure History & Physical: HPI:  [name] is a 66 y.o. male is here for an endoscopy and colonoscopy.   Past Medical History:  Diagnosis Date  . Anemia    prev bone marrow bx 02/11/10  . Anxiety   . Bone cancer ([place])   . Colitis 2004  . Colon polyps   . Depression   . Dysphagia   . GERD (gastroesophageal reflux disease)   . Need for prophylactic vaccination and inoculation against influenza 01/31/2013  . Neuropathy   . Sleep apnea 09-12-10   Uses CPAP  . 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[place])   . 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[facility])     Past Surgical History:  Procedure Laterality Date  . COLONOSCOPY  03/17/2011   Procedure: COLONOSCOPY;  Surgeon: Jeryl Columbia, MD;  Location: [facility] ENDOSCOPY;  Service: Endoscopy;  Laterality: N/A;  . HIP FRACTURE SURGERY  January 2013   R hip  . HIP FRACTURE SURGERY Right 02/2016  . NASAL FRACTURE SURGERY      Prior to Admission medications   Medication Sig Start Date End Date Taking? Authorizing Provider  Dexlansoprazole (DEXILANT PO) Take 40 mg by mouth daily.   Yes [provider]  pantoprazole (PROTONIX) 40 MG tablet Take 40 mg by mouth daily.   Yes [provider]  PARoxetine (PAXIL) 40 MG tablet Take 40 mg by mouth every morning.   Yes [provider]  Vitamin D, Cholecalciferol, 1000 UNITS TABS Take by mouth.   Yes [provider]    Allergies as of 08/10/2016 - Review Complete 07/24/2016  Allergen Reaction Noted  . Ibrutinib Nausea Only 08/21/2014    History reviewed. No pertinent family history.  Social History   Social History  . Marital status: Married    Spouse name: N/A  . Number of children: N/A  . Years of education: N/A   Occupational History  . Not on file.   Social History Main Topics  . Smoking status: Former Smoker    Quit date: 04/14/2007  . Smokeless tobacco: Never  Used  . Alcohol use No  . Drug use: No  . Sexual activity: Not on file   Other Topics Concern  . Not on file   Social History Narrative  . No narrative on file    Review of Systems: See HPI, otherwise negative ROS  Physical Exam: BP 127/78   Pulse 92   Temp (!) 96.4 F (35.8 C) (Tympanic)   Resp 18   Ht 5\' 11"  (1.803 m)   Wt 72.6 kg (160 lb)   SpO2 100%   BMI 22.32 kg/m  General:   Alert,  pleasant and cooperative in NAD Head:  Normocephalic and atraumatic. Neck:  Supple; no masses or thyromegaly. Lungs:  Clear throughout to auscultation.    Heart:  Regular rate and rhythm. Abdomen:  Soft, nontender and nondistended. Normal bowel sounds, without guarding, and without rebound.   Neurologic:  Alert and  oriented x4;  grossly normal neurologically.  Impression/Plan: [name] is here for an endoscopy and colonoscopy to be performed for [facility] colon polyps, GERD, and dysphagia.  Risks, benefits, limitations, and alternatives regarding  endoscopy and colonoscopy have been reviewed with the patient.  Questions have been answered.  All parties agreeable.   Gaylyn Cheers, MD  10/07/2016, 12:08 PM

## 2016-10-07 NOTE — Op Note (Signed)
[facility] Patient Name: [name] Procedure Date: 10/07/2016 12:07 PM MRN: [id number] Account #: [id number] Date of Birth: 01-May-1950 Admit Type: Outpatient Age: 66 Room: [facility] ENDO ROOM 3 Gender: Male Note Status: Finalized Procedure:            Colonoscopy Indications:          High risk colon cancer surveillance: Personal history                        of colonic polyps Providers:            Manya Silvas, MD Medicines:            Propofol per Anesthesia Complications:        No immediate complications. Procedure:            Pre-Anesthesia Assessment:                       - After reviewing the risks and benefits, the patient                        was deemed in satisfactory condition to undergo the                        procedure.                       After obtaining informed consent, the colonoscope was                        passed under direct vision. Throughout the procedure,                        the patient's blood pressure, pulse, and oxygen                        saturations were monitored continuously. The                        Colonoscope was introduced through the anus and                        advanced to the the cecum, identified by appendiceal                        orifice and ileocecal valve. The colonoscopy was                        somewhat difficult due to significant looping. The                        patient tolerated the procedure well. The quality of                        the bowel preparation was good. Findings:      A small polyp was found in the ascending colon. The polyp was sessile.       The polyp was removed with a hot snare. Resection and retrieval were       complete.      A few small-mouthed diverticula were found in the sigmoid colon and       descending  colon.      Internal hemorrhoids were found during endoscopy. The hemorrhoids were       medium-sized and Grade I (internal hemorrhoids  that do not prolapse).      The exam was otherwise without abnormality. Impression:           - One small polyp in the ascending colon, removed with                        a hot snare. Resected and retrieved.                       - Diverticulosis in the sigmoid colon and in the                        descending colon.                       - Internal hemorrhoids.                       - The examination was otherwise normal. Recommendation:       - Await pathology results. Manya Silvas, MD 10/07/2016 12:53:28 PM This report has been signed electronically. Number of Addenda: 0 Note Initiated On: 10/07/2016 12:07 PM Scope Withdrawal Time: 0 hours 4 minutes 40 seconds  Total Procedure Duration: 0 hours 14 minutes 29 seconds       [facility]

## 2016-10-08 ENCOUNTER — Encounter: Payer: Self-pay | Admitting: Unknown Physician Specialty

## 2016-10-09 LAB — SURGICAL PATHOLOGY

## 2016-10-23 ENCOUNTER — Inpatient Hospital Stay: Payer: Medicare Other | Attending: Hematology and Oncology

## 2016-10-23 DIAGNOSIS — F329 Major depressive disorder, single episode, unspecified: Secondary | ICD-10-CM | POA: Insufficient documentation

## 2016-10-23 DIAGNOSIS — Z87891 Personal history of nicotine dependence: Secondary | ICD-10-CM | POA: Insufficient documentation

## 2016-10-23 DIAGNOSIS — K219 Gastro-esophageal reflux disease without esophagitis: Secondary | ICD-10-CM | POA: Diagnosis not present

## 2016-10-23 DIAGNOSIS — G608 Other hereditary and idiopathic neuropathies: Secondary | ICD-10-CM | POA: Insufficient documentation

## 2016-10-23 DIAGNOSIS — C880 Waldenstrom macroglobulinemia: Secondary | ICD-10-CM | POA: Diagnosis not present

## 2016-10-23 DIAGNOSIS — Z79899 Other long term (current) drug therapy: Secondary | ICD-10-CM | POA: Insufficient documentation

## 2016-10-23 DIAGNOSIS — G4730 Sleep apnea, unspecified: Secondary | ICD-10-CM | POA: Diagnosis not present

## 2016-10-23 DIAGNOSIS — F419 Anxiety disorder, unspecified: Secondary | ICD-10-CM | POA: Insufficient documentation

## 2016-10-23 LAB — CBC WITH DIFFERENTIAL/PLATELET
Basophils Absolute: 0.1 10*3/uL (ref 0–0.1)
Basophils Relative: 1 %
Eosinophils Absolute: 0.1 10*3/uL (ref 0–0.7)
Eosinophils Relative: 2 %
HCT: 37.7 % — ABNORMAL LOW (ref 40.0–52.0)
Hemoglobin: 13 g/dL (ref 13.0–18.0)
Lymphocytes Relative: 24 %
Lymphs Abs: 1.6 10*3/uL (ref 1.0–3.6)
MCH: 29.9 pg (ref 26.0–34.0)
MCHC: 34.4 g/dL (ref 32.0–36.0)
MCV: 86.9 fL (ref 80.0–100.0)
Monocytes Absolute: 0.7 10*3/uL (ref 0.2–1.0)
Monocytes Relative: 10 %
Neutro Abs: 4.4 10*3/uL (ref 1.4–6.5)
Neutrophils Relative %: 63 %
Platelets: 349 10*3/uL (ref 150–440)
RBC: 4.33 MIL/uL — ABNORMAL LOW (ref 4.40–5.90)
RDW: 14.7 % — ABNORMAL HIGH (ref 11.5–14.5)
WBC: 6.9 10*3/uL (ref 3.8–10.6)

## 2016-10-24 LAB — IGM: IgM, Serum: 490 mg/dL — ABNORMAL HIGH (ref 20–172)

## 2016-10-29 LAB — PROTEIN ELECTROPHORESIS, SERUM
A/G Ratio: 1.2 (ref 0.7–1.7)
Albumin ELP: 3.6 g/dL (ref 2.9–4.4)
Alpha-1-Globulin: 0.2 g/dL (ref 0.0–0.4)
Alpha-2-Globulin: 0.8 g/dL (ref 0.4–1.0)
Beta Globulin: 1 g/dL (ref 0.7–1.3)
Gamma Globulin: 1 g/dL (ref 0.4–1.8)
Globulin, Total: 3.1 g/dL (ref 2.2–3.9)
M-Spike, %: 0.5 g/dL — ABNORMAL HIGH
Total Protein ELP: 6.7 g/dL (ref 6.0–8.5)

## 2016-12-03 ENCOUNTER — Other Ambulatory Visit: Payer: Self-pay | Admitting: Nurse Practitioner

## 2016-12-03 ENCOUNTER — Ambulatory Visit
Admission: RE | Admit: 2016-12-03 | Discharge: 2016-12-03 | Disposition: A | Discharge status: Home / Self Care | Payer: Medicare Other | Source: Ambulatory Visit | Attending: Nurse Practitioner | Admitting: Nurse Practitioner

## 2016-12-03 DIAGNOSIS — I700 Atherosclerosis of aorta: Secondary | ICD-10-CM | POA: Diagnosis not present

## 2016-12-03 DIAGNOSIS — K529 Noninfective gastroenteritis and colitis, unspecified: Secondary | ICD-10-CM | POA: Diagnosis not present

## 2016-12-03 DIAGNOSIS — K2960 Other gastritis without bleeding: Secondary | ICD-10-CM | POA: Insufficient documentation

## 2016-12-03 DIAGNOSIS — R1032 Left lower quadrant pain: Secondary | ICD-10-CM

## 2016-12-03 DIAGNOSIS — K5730 Diverticulosis of large intestine without perforation or abscess without bleeding: Secondary | ICD-10-CM | POA: Diagnosis not present

## 2016-12-03 DIAGNOSIS — R1031 Right lower quadrant pain: Secondary | ICD-10-CM

## 2016-12-03 MED ORDER — IOPAMIDOL (ISOVUE-300) INJECTION 61%
100.0000 mL | Freq: Once | INTRAVENOUS | Status: AC | PRN
  Administered 2016-12-03: 100 mL via INTRAVENOUS

## 2016-12-10 ENCOUNTER — Ambulatory Visit: Payer: Medicare Other

## 2016-12-11 ENCOUNTER — Other Ambulatory Visit: Payer: Self-pay | Admitting: Nurse Practitioner

## 2016-12-11 ENCOUNTER — Ambulatory Visit
Admission: RE | Admit: 2016-12-11 | Discharge: 2016-12-11 | Disposition: A | Discharge status: Home / Self Care | Payer: Medicare Other | Source: Ambulatory Visit | Attending: Nurse Practitioner | Admitting: Nurse Practitioner

## 2016-12-11 DIAGNOSIS — R1084 Generalized abdominal pain: Secondary | ICD-10-CM | POA: Insufficient documentation

## 2016-12-11 DIAGNOSIS — K838 Other specified diseases of biliary tract: Secondary | ICD-10-CM

## 2016-12-11 DIAGNOSIS — K529 Noninfective gastroenteritis and colitis, unspecified: Secondary | ICD-10-CM | POA: Insufficient documentation

## 2016-12-11 DIAGNOSIS — R935 Abnormal findings on diagnostic imaging of other abdominal regions, including retroperitoneum: Secondary | ICD-10-CM | POA: Diagnosis not present

## 2016-12-11 DIAGNOSIS — K839 Disease of biliary tract, unspecified: Secondary | ICD-10-CM | POA: Diagnosis not present

## 2016-12-11 DIAGNOSIS — R39198 Other difficulties with micturition: Secondary | ICD-10-CM | POA: Insufficient documentation

## 2016-12-11 DIAGNOSIS — Q559 Congenital malformation of male genital organ, unspecified: Secondary | ICD-10-CM | POA: Insufficient documentation

## 2016-12-11 DIAGNOSIS — R933 Abnormal findings on diagnostic imaging of other parts of digestive tract: Secondary | ICD-10-CM | POA: Insufficient documentation

## 2016-12-11 DIAGNOSIS — K591 Functional diarrhea: Secondary | ICD-10-CM | POA: Insufficient documentation

## 2016-12-11 MED ORDER — GADOBENATE DIMEGLUMINE 529 MG/ML IV SOLN
15.0000 mL | Freq: Once | INTRAVENOUS | Status: AC | PRN
  Administered 2016-12-11: 15 mL via INTRAVENOUS

## 2016-12-23 ENCOUNTER — Encounter: Payer: Self-pay | Admitting: Urology

## 2016-12-23 ENCOUNTER — Ambulatory Visit (INDEPENDENT_AMBULATORY_CARE_PROVIDER_SITE_OTHER): Payer: Medicare Other | Admitting: Urology

## 2016-12-23 VITALS — BP 123/73 | HR 92 | Ht 71.0 in | Wt 160.0 lb

## 2016-12-23 DIAGNOSIS — R3912 Poor urinary stream: Secondary | ICD-10-CM

## 2016-12-23 DIAGNOSIS — N411 Chronic prostatitis: Secondary | ICD-10-CM

## 2016-12-23 LAB — URINALYSIS, COMPLETE
Bilirubin, UA: NEGATIVE
Glucose, UA: NEGATIVE
LEUKOCYTES UA: NEGATIVE
Nitrite, UA: NEGATIVE
PH UA: 5.5 (ref 5.0–7.5)
PROTEIN UA: NEGATIVE
RBC, UA: NEGATIVE
Specific Gravity, UA: >1.03 — ABNORMAL HIGH (ref 1.005–1.030)
Urobilinogen, Ur: 0.2 mg/dL (ref 0.2–1.0)

## 2016-12-23 LAB — MICROSCOPIC EXAMINATION
EPITHELIAL CELLS (NON RENAL): NONE SEEN /HPF (ref 0–10)
RBC, UA: NONE SEEN /hpf (ref 0–?)
WBC, UA: NONE SEEN /hpf (ref 0–?)

## 2016-12-23 LAB — BLADDER SCAN AMB NON-IMAGING

## 2016-12-23 MED ORDER — TAMSULOSIN HCL 0.4 MG PO CAPS: 0.4000 mg | ORAL_CAPSULE | Freq: Every day | ORAL | 0 refills | Status: DC

## 2016-12-23 MED ORDER — IBUPROFEN 800 MG PO TABS: 800.0000 mg | ORAL_TABLET | Freq: Three times a day (TID) | ORAL | 0 refills | Status: DC | PRN

## 2016-12-23 NOTE — Progress Notes (Signed)
12/23/2016 10:44 AM   [name] [birth year] [id number]  Referring provider: Maryland Pink, MD [address] [facility] [place], [place] [address]  Chief Complaint  Patient presents with  . Testicle Pain    New Patient    HPI: 66 year old male who presents today with 1 month of diffuse lower abdominal pain, scrotal pain, and change in his urinary symptoms.  He reports that over the past month, he has been struggling with diffuse lower abdominal pain. Specifically, he has pain which radiates into his groin and down into his scrotum. This is constant, dull and does not seem worsen or improve with activity.     He does also have some mild urinary symptoms. These include some mild urinary hesitancy and sensation of incomplete bladder emptying. PVR today is minimal. He does have occasional intermittent dysuria without hematuria. UA today is negative. He has also had several urinalyses over the past month which shows no sign of infection/  microscopic hematuria and negative urine cultures.  He did have a CT abdomen pelvis with contrast on 12/03/2016 which demonstrates air-fluid levels within bowel with severe diverticulosis but no obvious diverticulitis. No GU pathology identified. No inguinal hernias appreciated.  He does have a personal history of diverticulitis. He was treated for 2 weeks with Cipro, Flagyl by gastroenterologist without improvement of his symptoms.  Most recent PSA 0.44 on 01/2016.   PMH: Past Medical History:  Diagnosis Date  . Anemia    prev bone marrow bx 02/11/10  . Anxiety   . Bone cancer ([place])   . Colitis 2004  . Colon polyps   . Depression   . Dysphagia   . GERD (gastroesophageal reflux disease)   . Need for prophylactic vaccination and inoculation against influenza 01/31/2013  . Neuropathy   . Sleep apnea 09-12-10   Uses CPAP  . 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[facility])     Surgical  History: Past Surgical History:  Procedure Laterality Date  . COLONOSCOPY  03/17/2011   Procedure: COLONOSCOPY;  Surgeon: Jeryl Columbia, MD;  Location: [facility] ENDOSCOPY;  Service: Endoscopy;  Laterality: N/A;  . COLONOSCOPY WITH PROPOFOL N/A 10/07/2016   Procedure: COLONOSCOPY WITH PROPOFOL;  Surgeon: Manya Silvas, MD;  Location: [facility] ENDOSCOPY;  Service: Endoscopy;  Laterality: N/A;  . ESOPHAGOGASTRODUODENOSCOPY (EGD) WITH PROPOFOL N/A 10/07/2016   Procedure: ESOPHAGOGASTRODUODENOSCOPY (EGD) WITH PROPOFOL;  Surgeon: Manya Silvas, MD;  Location: [facility] ENDOSCOPY;  Service: Endoscopy;  Laterality: N/A;  . HIP FRACTURE SURGERY  January 2013   R hip  . HIP FRACTURE SURGERY Right 02/2016  . NASAL FRACTURE SURGERY      Home Medications:  Allergies as of 12/23/2016      Reactions   Ibrutinib Nausea Only      Medication List       Accurate as of 12/23/16 10:44 AM. Always use your most recent med list.          ibuprofen 800 MG tablet Commonly known as:  ADVIL,MOTRIN Take 1 tablet (800 mg total) by mouth every 8 (eight) hours as needed.   pantoprazole 40 MG tablet Commonly known as:  PROTONIX Take 40 mg by mouth daily.   PARoxetine 40 MG tablet Commonly known as:  PAXIL Take 40 mg by mouth every morning.   tamsulosin 0.4 MG Caps capsule Commonly known as:  FLOMAX Take 1 capsule (0.4 mg total) by mouth daily.  Discharge Care Instructions        Start     Ordered   12/23/16 0000  Urinalysis, Complete     12/23/16 0954   12/23/16 0000  BLADDER SCAN AMB NON-IMAGING     12/23/16 0954   12/23/16 0000  tamsulosin (FLOMAX) 0.4 MG CAPS capsule  Daily     12/23/16 1017   12/23/16 0000  ibuprofen (ADVIL,MOTRIN) 800 MG tablet  Every 8 hours PRN     12/23/16 1017      Allergies:  Allergies  Allergen Reactions  . Ibrutinib Nausea Only    Family History: Family History  Problem Relation Age of Onset  . Prostate cancer Neg Hx   . Bladder Cancer Neg Hx   .  Kidney cancer Neg Hx     Social History:  reports that he quit smoking about 9 years ago. He has never used smokeless tobacco. He reports that he does not drink alcohol or use drugs.  ROS: UROLOGY Frequent Urination?: No Hard to postpone urination?: No Burning/pain with urination?: No Get up at night to urinate?: No Leakage of urine?: No Urine stream starts and stops?: Yes Trouble starting stream?: Yes Do you have to strain to urinate?: Yes Blood in urine?: No Urinary tract infection?: No Sexually transmitted disease?: No Injury to kidneys or bladder?: No Painful intercourse?: No Weak stream?: No Erection problems?: Yes Penile pain?: No  Gastrointestinal Nausea?: Yes Vomiting?: No Indigestion/heartburn?: Yes Diarrhea?: Yes Constipation?: No  Constitutional Fever: No Night sweats?: Yes Weight loss?: No Fatigue?: No  Skin Skin rash/lesions?: No Itching?: No  Eyes Blurred vision?: No Double vision?: No  Ears/Nose/Throat Sore throat?: No Sinus problems?: No  Hematologic/Lymphatic Swollen glands?: No Easy bruising?: No  Cardiovascular Leg swelling?: No Chest pain?: No  Respiratory Cough?: No Shortness of breath?: No  Endocrine Excessive thirst?: Yes  Musculoskeletal Back pain?: No Joint pain?: No  Neurological Headaches?: No Dizziness?: No  Psychologic Depression?: Yes Anxiety?: Yes  Physical Exam: BP 123/73   Pulse 92   Ht 5\' 11"  (1.803 m)   Wt 160 lb (72.6 kg)   BMI 22.32 kg/m   Constitutional:  Alert and oriented, No acute distress. HEENT: [place] AT, moist mucus membranes.  Trachea midline, no masses. Cardiovascular: No clubbing, cyanosis, or edema. Respiratory: Normal respiratory effort, no increased work of breathing. GI: Abdomen is soft.   Diffuse mild lower abdominal tenderness with deep palpation. No rebound or guarding. GU: Circumcised phallus with orthotopic meatus. Bilateral descended testicles, mild tenderness bilaterally with  manipulation. No scrotal abnormalities or lesions.  No focal tenderness. Rectal: Positive external sphincters, normal sphincter tone. 50 cc prostate, diffusely tender with manipulation. Skin: No rashes, bruises or suspicious lesions. Lymph: No cervical or inguinal adenopathy. Neurologic: Grossly intact, no focal deficits, moving all 4 extremities. Psychiatric: Normal mood and affect.  Laboratory Data: Lab Results  Component Value Date   WBC 6.9 10/23/2016   HGB 13.0 10/23/2016   HCT 37.7 (L) 10/23/2016   MCV 86.9 10/23/2016   PLT 349 10/23/2016    Lab Results  Component Value Date   CREATININE 0.81 07/24/2016   PSA as above  Urinalysis UA today reviewed  Pertinent Imaging: CLINICAL DATA:  RIGHT lower quadrant pain for 1 week, chills. Status post colonoscopy 2 months ago and, Wadenstrom's macroglobulinemia. Assess for appendicitis.  EXAM: CT ABDOMEN AND PELVIS WITH CONTRAST  TECHNIQUE: Multidetector CT imaging of the abdomen and pelvis was performed using the standard protocol following bolus administration of intravenous contrast.  CONTRAST:  147mL ISOVUE-300 IOPAMIDOL (ISOVUE-300) INJECTION 61%  COMPARISON:  CT abdomen and pelvis Aug 31, 2012  FINDINGS: LOWER CHEST: Lung bases are clear. Included heart size is normal. Small pericardial effusion.  HEPATOBILIARY: Stable 9 mm and 7 mm cysts LEFT lobe of the liver. Focal fatty infiltration about the falciform ligament. Normal gallbladder. Focal nonobstructing soft tissue density within distal Common bile duct (coronal 34/85).  PANCREAS: Normal.  SPLEEN: Normal.  ADRENALS/URINARY TRACT: Kidneys are orthotopic, demonstrating symmetric enhancement. No nephrolithiasis, hydronephrosis or solid renal masses. The unopacified ureters are normal in course and caliber. Delayed imaging through the kidneys demonstrates symmetric prompt contrast excretion within the proximal urinary collecting system. Urinary  bladder is partially distended and unremarkable. Normal adrenal glands.  STOMACH/BOWEL: The stomach, small and large bowel are normal in course and caliber without inflammatory changes. Moderate descending and severe sigmoid colonic diverticulosis. Scattered small and large bowel air-fluid levels. Normal appendix.  VASCULAR/LYMPHATIC: Aortoiliac vessels are normal in course and caliber. Moderate atherosclerosis. No lymphadenopathy by CT size criteria.  REPRODUCTIVE: Normal.  OTHER: Small amount of free fluid in the pelvis. No focal fluid collection. No intraperitoneal free air.  MUSCULOSKELETAL: Nonacute. Streak artifact from RIGHT hip total arthroplasty. Osteopenia.  IMPRESSION: 1. Small and large bowel air-fluid levels seen with enteritis, no bowel obstruction. Severe sigmoid diverticulosis. 2. Mass, sludge or possibly stone within the distal Common bile duct without biliary dilatation. Recommend MRCP. 3. Aortic Atherosclerosis (ICD10-I70.0). These results will be called to the ordering clinician or representative by the Radiology Department at the imaging location.   Electronically Signed   By: Elon Alas M.D.   On: 12/03/2016 18:03  CT personally reviewed  Assessment & Plan:    1. Chronic prostatitis Constellation of symptoms including abdominal pain, scrotal pain, and tenderness on rectal exam along with urinary symptoms all consistent with prostatitis  Recommend course of NSAIDs as tolerated from a GI perspective, Flomax, sitz bath, supportive care.  Given lack of improvement with anabiotic's for 2 weeks, do not feel that this is a bacterial infection.  2. Weak urine stream As above Adequate bladder emptying on postvoid residual - Urinalysis, Complete - BLADDER SCAN AMB NON-IMAGING   Return in about 4 weeks (around 01/20/2017) for recheck.  Hollice Espy, MD  [facility] [address] [place], [place] [address] [phone]

## 2016-12-23 NOTE — Patient Instructions (Signed)
Prostatitis Prostatitis is swelling or inflammation of the prostate gland. The prostate is a walnut-sized gland that is involved in the production of semen. It is located below a man's bladder, in front of the rectum. There are four types of prostatitis:  Chronic nonbacterial prostatitis. This is the most common type of prostatitis. It may be associated with a viral infection or autoimmune disorder.  Acute bacterial prostatitis. This is the least common type of prostatitis. It starts quickly and is usually associated with a bladder infection, high fever, and shaking chills. It can occur at any age.  Chronic bacterial prostatitis. This type usually results from acute bacterial prostatitis that happens repeatedly (is recurrent) or has not been treated properly. It can occur in men of any age but is most common among middle-aged men whose prostate has begun to get larger. The symptoms are not as severe as symptoms caused by acute bacterial prostatitis.  Prostatodynia or chronic pelvic pain syndrome (CPPS). This type is also called pelvic floor disorder. It is associated with increased muscular tone in the pelvis surrounding the prostate. What are the causes? Bacterial prostatitis is caused by infection from bacteria. Chronic nonbacterial prostatitis may be caused by:  Urinary tract infections (UTIs).  Nerve damage.  A response by the body's disease-fighting system (autoimmune response).  Chemicals in the urine. The causes of the other types of prostatitis are usually not known. What are the signs or symptoms? Symptoms of this condition vary depending upon the type of prostatitis. If you have acute bacterial prostatitis, you may experience:  Urinary symptoms, such as:  Painful urination.  Burning during urination.  Frequent and sudden urges to urinate.  Inability to start urinating.  A weak or interrupted stream of urine.  Vomiting.  Nausea.  Fever.  Chills.  Inability to  empty the bladder completely.  Pain in the:  Muscles or joints.  Lower back.  Lower abdomen. If you have any of the other types of prostatitis, you may experience:  Urinary symptoms, such as:  Sudden urges to urinate.  Frequent urination.  Difficulty starting urination.  Weak urine stream.  Dribbling after urination.  Discharge from the urethra. The urethra is a tube that opens at the end of the penis.  Pain in the:  Testicles.  Penis or tip of the penis.  Rectum.  Area in front of the rectum and below the scrotum (perineum).  Problems with sexual function.  Painful ejaculation.  Bloody semen. How is this diagnosed? This condition may be diagnosed based on:  A physical and medical exam.  Your symptoms.  A urine test to check for bacteria.  An exam in which a health care provider uses a finger to feel the prostate (digital rectal exam).  A test of a sample of semen.  Blood tests.  Ultrasound.  Removal of prostate tissue to be examined under a microscope (biopsy).  Tests to check how your body handles urine (urodynamic tests).  A test to look inside your bladder or urethra (cystoscopy). How is this treated? Treatment for this condition depends on the type of prostatitis. Treatment may involve:  Medicines to relieve pain or inflammation.  Medicines to help relax your muscles.  Physical therapy.  Heat therapy.  Techniques to help you control certain body functions (biofeedback).  Relaxation exercises.  Antibiotic medicine, if your condition is caused by bacteria.  Warm water baths (sitz baths). Sitz baths help with relaxing your pelvic floor muscles, which helps to relieve pressure on the prostate. Follow   these instructions at home:  Take over-the-counter and prescription medicines only as told by your health care provider.  If you were prescribed an antibiotic, take it as told by your health care provider. Do not stop taking the  antibiotic even if you start to feel better.  If physical therapy, biofeedback, or relaxation exercises were prescribed, do exercises as instructed.  Take sitz baths as directed by your health care provider. For a sitz bath, sit in warm water that is deep enough to cover your hips and buttocks.  Keep all follow-up visits as told by your health care provider. This is important. Contact a health care provider if:  Your symptoms get worse.  You have a fever. Get help right away if:  You have chills.  You feel nauseous.  You vomit.  You feel light-headed or feel like you are going to faint.  You are unable to urinate.  You have blood or blood clots in your urine. This information is not intended to replace advice given to you by your health care provider. Make sure you discuss any questions you have with your health care provider. Document Released: 03/27/2000 Document Revised: 12/19/2015 Document Reviewed: 12/19/2015 Elsevier Interactive Patient Education  2017 [employer]  

## 2017-01-12 ENCOUNTER — Encounter (INDEPENDENT_AMBULATORY_CARE_PROVIDER_SITE_OTHER): Payer: Self-pay | Admitting: Vascular Surgery

## 2017-01-12 ENCOUNTER — Ambulatory Visit (INDEPENDENT_AMBULATORY_CARE_PROVIDER_SITE_OTHER): Payer: Medicare Other | Admitting: Vascular Surgery

## 2017-01-12 VITALS — BP 115/69 | HR 83 | Resp 16 | Ht 71.0 in | Wt 162.0 lb

## 2017-01-12 DIAGNOSIS — N419 Inflammatory disease of prostate, unspecified: Secondary | ICD-10-CM

## 2017-01-12 DIAGNOSIS — I700 Atherosclerosis of aorta: Secondary | ICD-10-CM | POA: Insufficient documentation

## 2017-01-12 DIAGNOSIS — R1084 Generalized abdominal pain: Secondary | ICD-10-CM | POA: Diagnosis not present

## 2017-01-12 NOTE — Progress Notes (Signed)
Patient ID: [name], male   DOB: Sep 06, 1950, 66 y.o.   MRN: [id number]  Chief Complaint  Patient presents with  . New Patient (Initial Visit)    ABD Pain, CT showed moderate Athero.    HPI [name] is a 66 y.o. male.  I am asked to see the patient by K. Jerelene Redden, NP for evaluation of aortic atherosclerosis. For a couple of months now, he has been having intermittent abdominal pain of unclear etiology. He has had extensive workup for multiple systems including gallbladder disease, CT scans for appendicitis, and has recently seen urology and has been treated for prostatitis with some improvement. He is still having abdominal pain is actually having it today. He is a very jumpy individual and sometimes just touching his abdomen makes him wince in pain. He does not have fever or chills. He did have diarrhea at the onset of symptoms but this is better. He denies any chest pain or shortness of breath. He had a CT scan of the abdomen and pelvis which I have independently reviewed. This does demonstrate mild to moderate aortic atherosclerosis. The CT scan is the typical 5 mm cutdown at our institution which is not adequate for true assessment of the visceral vessels, but the SMA appears to not have significant stenosis. The celiac may have some stenosis proximally although it is difficult to discern. The IMA is small and I cannot comment on whether or not stenosis is present based off of the suboptimal CT scan.   Past Medical History:  Diagnosis Date  . Anemia    prev bone marrow bx 02/11/10  . Anxiety   . Bone cancer ([place])   . Colitis 2004  . Colon polyps   . Depression   . Dysphagia   . GERD (gastroesophageal reflux disease)   . Need for prophylactic vaccination and inoculation against influenza 01/31/2013  . Neuropathy   . Sleep apnea 09-12-10   Uses CPAP  . 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[facility])     Past Surgical History:    Procedure Laterality Date  . COLONOSCOPY  03/17/2011   Procedure: COLONOSCOPY;  Surgeon: Jeryl Columbia, MD;  Location: [facility] ENDOSCOPY;  Service: Endoscopy;  Laterality: N/A;  . COLONOSCOPY WITH PROPOFOL N/A 10/07/2016   Procedure: COLONOSCOPY WITH PROPOFOL;  Surgeon: Manya Silvas, MD;  Location: [facility] ENDOSCOPY;  Service: Endoscopy;  Laterality: N/A;  . ESOPHAGOGASTRODUODENOSCOPY (EGD) WITH PROPOFOL N/A 10/07/2016   Procedure: ESOPHAGOGASTRODUODENOSCOPY (EGD) WITH PROPOFOL;  Surgeon: Manya Silvas, MD;  Location: [facility] ENDOSCOPY;  Service: Endoscopy;  Laterality: N/A;  . HIP FRACTURE SURGERY  January 2013   R hip  . HIP FRACTURE SURGERY Right 02/2016  . NASAL FRACTURE SURGERY      Family History Family History  Problem Relation Age of Onset  . Prostate cancer Neg Hx   . Bladder Cancer Neg Hx   . Kidney cancer Neg Hx   No bleeding disorders, clotting disorders, autoimmune diseases or aneurysms  Social History Social History  Substance Use Topics  . Smoking status: Former Smoker    Quit date: 04/14/2007  . Smokeless tobacco: Never Used  . Alcohol use No  No IVDU  Allergies  Allergen Reactions  . Ibrutinib Nausea Only    Current Outpatient Prescriptions  Medication Sig Dispense Refill  . ibuprofen (ADVIL,MOTRIN) 800 MG tablet Take 1 tablet (800 mg total) by mouth every 8 (eight) hours as needed. 28 tablet 0  . pantoprazole (  PROTONIX) 40 MG tablet Take 40 mg by mouth daily.    Marland Kitchen PARoxetine (PAXIL) 40 MG tablet Take 40 mg by mouth every morning.    . tamsulosin (FLOMAX) 0.4 MG CAPS capsule Take 1 capsule (0.4 mg total) by mouth daily. 30 capsule 0   No current facility-administered medications for this visit.       REVIEW OF SYSTEMS (Negative unless checked)  Constitutional: [] Weight loss  [] Fever  [] Chills Cardiac: [] Chest pain   [] Chest pressure   [] Palpitations   [] Shortness of breath when laying flat   [] Shortness of breath at rest   [] Shortness of breath with  exertion. Vascular:  [] Pain in legs with walking   [] Pain in legs at rest   [] Pain in legs when laying flat   [] Claudication   [] Pain in feet when walking  [] Pain in feet at rest  [] Pain in feet when laying flat   [] History of DVT   [] Phlebitis   [] Swelling in legs   [] Varicose veins   [] Non-healing ulcers Pulmonary:   [] Uses home oxygen   [] Productive cough   [] Hemoptysis   [] Wheeze  [] COPD   [] Asthma Neurologic:  [] Dizziness  [] Blackouts   [] Seizures   [] History of stroke   [] History of TIA  [] Aphasia   [] Temporary blindness   [] Dysphagia   [] Weakness or numbness in arms   [] Weakness or numbness in legs Musculoskeletal:  [x] Arthritis   [] Joint swelling   [] Joint pain   [] Low back pain Hematologic:  [] Easy bruising  [] Easy bleeding   [] Hypercoagulable state   [x] Anemic  [] Hepatitis Gastrointestinal:  [] Blood in stool   [] Vomiting blood  [x] Gastroesophageal reflux/heartburn   [x] Abdominal pain Genitourinary:  [] Chronic kidney disease   [x] Difficult urination  [] Frequent urination  [] Burning with urination   [] Hematuria Skin:  [] Rashes   [] Ulcers   [] Wounds Psychological:  [x] History of anxiety   [x]  History of major depression.    Physical Exam BP 115/69 (BP Location: Left Arm)   Pulse 83   Resp 16   Ht 5\' 11"  (1.803 m)   Wt 73.5 kg (162 lb)   BMI 22.59 kg/m  Gen:  WD/WN, NAD Head: [place]/AT, No temporalis wasting.  Ear/Nose/Throat: Hearing grossly intact, nares w/o erythema or drainage, oropharynx w/o Erythema/Exudate Eyes: Conjunctiva clear, sclera non-icteric  Neck: trachea midline.  No JVD.  Pulmonary:  Good air movement, respirations not labored, no use of accessory muscles  Cardiac: RRR, no JVD Vascular:  Vessel Right Left  Radial Palpable Palpable                          Posterior tibial  Palpable  Palpable   Dorsalis pedis  Palpable  Palpable    Gastrointestinal:. No masses, surgical incisions, or scars. Musculoskeletal: M/S 5/5 throughout.  Extremities without ischemic  changes.  No deformity or atrophy.  Neurologic: Sensation grossly intact in extremities.  Symmetrical.  Speech is fluent. Motor exam as listed above. Psychiatric: Judgment intact, Mood & affect appropriate for pt's clinical situation.     Radiology No results found.  Labs Recent Results (from the past 2160 hour(s))  CBC with Differential/Platelet     Status: Abnormal   Collection Time: 10/23/16 10:10 AM  Result Value Ref Range   WBC 6.9 3.8 - 10.6 K/uL   RBC 4.33 (L) 4.40 - 5.90 MIL/uL   Hemoglobin 13.0 13.0 - 18.0 g/dL   HCT 37.7 (L) 40.0 - 52.0 %   MCV 86.9 80.0 - 100.0  fL   MCH 29.9 26.0 - 34.0 pg   MCHC 34.4 32.0 - 36.0 g/dL   RDW 14.7 (H) 11.5 - 14.5 %   Platelets 349 150 - 440 K/uL   Neutrophils Relative % 63 %   Neutro Abs 4.4 1.4 - 6.5 K/uL   Lymphocytes Relative 24 %   Lymphs Abs 1.6 1.0 - 3.6 K/uL   Monocytes Relative 10 %   Monocytes Absolute 0.7 0.2 - 1.0 K/uL   Eosinophils Relative 2 %   Eosinophils Absolute 0.1 0 - 0.7 K/uL   Basophils Relative 1 %   Basophils Absolute 0.1 0 - 0.1 K/uL  Protein electrophoresis, serum     Status: Abnormal   Collection Time: 10/23/16 10:10 AM  Result Value Ref Range   Total Protein ELP 6.7 6.0 - 8.5 g/dL   Albumin ELP 3.6 2.9 - 4.4 g/dL   Alpha-1-Globulin 0.2 0.0 - 0.4 g/dL   Alpha-2-Globulin 0.8 0.4 - 1.0 g/dL   Beta Globulin 1.0 0.7 - 1.3 g/dL   Gamma Globulin 1.0 0.4 - 1.8 g/dL   M-Spike, % 0.5 (H) Not Observed g/dL   SPE Interp. Comment     Comment: (NOTE) The SPE pattern demonstrates a single peak (M-spike) in the gamma region which may represent monoclonal protein. This peak may also be caused by circulating immune complexes, cryoglobulins, C-reactive protein, fibrinogen or hemolysis.  If clinically indicated, the presence of a monoclonal gammopathy may be confirmed by immuno- fixation, as well as an evaluation of the urine for the presence of Bence-Jones protein. Performed At: [facility] [place], [place] [id number] Lindon Romp MD QH:[id number]    Comment Comment     Comment: (NOTE) Protein electrophoresis scan will follow via computer, mail, or courier delivery.    GLOBULIN, TOTAL 3.1 2.2 - 3.9 g/dL   A/G Ratio 1.2 0.7 - 1.7  IgM     Status: Abnormal   Collection Time: 10/23/16 10:10 AM  Result Value Ref Range   IgM, Serum 490 (H) 20 - 172 mg/dL    Comment: (NOTE) Performed At: [facility] [place], [place] [id number] Lindon Romp MD TZ:[id number]   BLADDER SCAN AMB NON-IMAGING     Status: None   Collection Time: 12/23/16  9:54 AM  Result Value Ref Range   Scan Result 19ml   Urinalysis, Complete     Status: Abnormal   Collection Time: 12/23/16  9:55 AM  Result Value Ref Range   Specific Gravity, UA >1.030 (H) 1.005 - 1.030   pH, UA 5.5 5.0 - 7.5   Color, UA Yellow Yellow   Appearance Ur Clear Clear   Leukocytes, UA Negative Negative   Protein, UA Negative Negative/Trace   Glucose, UA Negative Negative   Ketones, UA Trace (A) Negative   RBC, UA Negative Negative   Bilirubin, UA Negative Negative   Urobilinogen, Ur 0.2 0.2 - 1.0 mg/dL   Nitrite, UA Negative Negative   Microscopic Examination See below:   Microscopic Examination     Status: Abnormal   Collection Time: 12/23/16  9:55 AM  Result Value Ref Range   WBC, UA None seen 0 - 5 /hpf   RBC, UA None seen 0 - 2 /hpf   Epithelial Cells (non renal) None seen 0 - 10 /hpf   Casts Present (A) None seen /lpf   Cast Type Hyaline casts N/A   Mucus, UA Present (A) Not Estab.   Bacteria, UA Few (  A) None seen/Few    Assessment/Plan:  Prostatitis Sees urology.  Some improvement with treatment of this.  Aortic atherosclerosis ([place]) He had a CT scan of the abdomen and pelvis which I have independently reviewed. This does demonstrate mild to moderate aortic atherosclerosis. The CT scan is the typical 5 mm cutdown at our institution which is not adequate for true assessment  of the visceral vessels, but the SMA appears to not have significant stenosis. The celiac may have some stenosis proximally although it is difficult to discern. The IMA is small and I cannot comment on whether or not stenosis is present based off of the suboptimal CT scan.  Abdominal pain, diffuse He had a CT scan of the abdomen and pelvis which I have independently reviewed. This does demonstrate mild to moderate aortic atherosclerosis. The CT scan is the typical 5 mm cutdown at our institution which is not adequate for true assessment of the visceral vessels, but the SMA appears to not have significant stenosis. The celiac may have some stenosis proximally although it is difficult to discern. The IMA is small and I cannot comment on whether or not stenosis is present based off of the suboptimal CT scan. I had a long discussion with he and his wife today regarding mesenteric vascular disease. I think it would be prudent to perform a mesenteric duplex although I think his clinical scenario was not typical for chronic visceral ischemia. I have discussed the pathophysiology and natural history of chronic mesenteric ischemia. We will see him back following the noninvasive study to discuss the results and determine further treatment options.      Leotis Pain 01/12/2017, 4:08 PM   This note was created with Dragon medical transcription system.  Any errors from dictation are unintentional.

## 2017-01-12 NOTE — Patient Instructions (Signed)
Chronic Mesenteric Ischemia Mesenteric ischemia is poor blood flow (circulation) in the vessels that supply blood to the stomach, intestines, and liver (mesenteric organs). Chronic mesenteric ischemia, also called mesenteric angina or intestinal angina, is a long-term (chronic) condition. It happens when an artery or vein that provides blood to the mesenteric organs gradually becomes blocked or narrow, restricting the blood supply to the organs. When the blood supply is severely restricted, the mesenteric organs cannot work properly. What are the causes? This condition is commonly caused by fatty deposits that build up in an artery (plaque), which can narrow the artery and restrict blood flow. Other causes include:  Weakened areas in blood vessel walls (aneurysms).  Conditions that cause twisting or inflammation of blood vessels, such as fibromuscular dysplasia or arteritis.  A disorder in which blood clots form in the veins (venous thrombosis).  Scarring and thickening (fibrosis) of blood vessels caused by radiation therapy.  A tear in the aorta, the body's main artery (aortic dissection).  Blood vessel problems after illegal drug use, such as use of cocaine.  Tumors in the nervous system (neurofibromatosis).  Certain autoimmune diseases, such as lupus.  What increases the risk? The following factors may make you more likely to develop this condition:  Being male.  Being over age 50, especially if you have a history of heart problems.  Smoking.  Congestive heart failure.  Irregular heartbeat (arrhythmia).  Having a history of heart attack or stroke.  Diabetes.  High cholesterol.  High blood pressure (hypertension).  Being overweight or obese.  Kidney disease (renal disease) requiring dialysis.  What are the signs or symptoms? Symptoms of this condition include:  Abdomen (abdominal) pain or cramps that develop 15-60 minutes after a meal. This pain may last for 1-3  hours. Some people may develop a fear of eating because of this symptom.  Weight loss.  Diarrhea.  Bloody stool.  Nausea.  Vomiting.  Bloating.  Abdominal pain after stress or with exercise.  How is this diagnosed? This condition is diagnosed based on:  Your medical history.  A physical exam.  Tests, such as: ? Ultrasound. ? CT scan. ? Blood tests. ? Urine tests. ? An imaging test that involves injecting a dye into your arteries to show blood flow through blood vessels (angiogram). This can help to show if there are any blockages in the vessels that lead to the intestines. ? Passing a small probe through the mouth and into the stomach to measure the output of carbon dioxide (gastric tonometry). This can help to indicate whether there is decreased blood flow to the stomach and intestines.  How is this treated? This condition may be treated with:  Dietary changes such as eating smaller, low-fat, meals more frequently.  Lifestyle changes to treat underlying conditions that contribute to the disease, such as high cholesterol and high blood pressure.  Medicines to reduce blood clotting and increase blood flow.  Surgery to remove the blockage, repair arteries or veins, and restore blood flow. This may involve: ? Angioplasty. This is surgery to widen the affected artery, reduce the blockage, and sometimes insert a small, mesh tube (stent). ? Bypass surgery. This may be done to go around (bypass) the blockage and reconnect healthy arteries or veins. ? Placing a stent in the affected area. This may be done to help keep blocked arteries open.  Follow these instructions at home: Eating and drinking  Eat a heart-healthy diet. This includes fresh fruits and vegetables, whole grains, and lean proteins   like chicken, fish, eggs, and beans.  Avoid foods that contain a lot of: ? Salt (sodium). ? Sugar. ? Saturated fat (such as red meat). ? Trans fat (such as fried foods).  Stay  hydrated. Drink enough fluid to keep your urine clear or pale yellow. Lifestyle  Stay active and get regular exercise as told by your health care provider. Aim for 150 minutes of moderate activity or 75 minutes of vigorous activity a week. Ask your health care provider what activities and forms of exercise are safe for you.  Maintain a healthy weight.  Work with your health care provider to manage your cholesterol.  Manage any other health problems you have, such as high blood pressure, diabetes, or heart rhythm problems.  Do not use any products that contain nicotine or tobacco, such as cigarettes and e-cigarettes. If you need help quitting, ask your health care provider. General instructions  Take over-the-counter and prescription medicines only as told by your health care provider.  Keep all follow-up visits as told by your health care provider. This is important. Contact a health care provider if:  Your symptoms do not improve or they return after treatment.  You have a fever. Get help right away if:  You have severe abdominal pain.  You have severe chest pain.  You have shortness of breath.  You feel weak or dizzy.  You have palpitations.  You have numbness or weakness in your face, arm, or leg.  You are confused.  You have trouble speaking or people have trouble understanding what you are saying.  You are constipated.  You have trouble urinating.  You have blood in your stool.  You have severe nausea, vomiting, or persistent diarrhea. Summary  Mesenteric ischemia is poor circulation in the vessels that supply blood to the the stomach, intestines, and liver (mesenteric organs).  This condition happens when an artery or vein that provides blood to the mesenteric organs gradually becomes blocked or narrow, restricting the blood supply to the organs.  This condition is commonly caused by fatty deposits that build up in an artery (plaque), which can narrow the  artery and restrict blood flow.  You are more likely to develop this condition if you are over age 50 and have a history of heart problems, high blood pressure, diabetes, or high cholesterol.  This condition is usually treated with medicines, dietary and lifestyle changes, and surgery to remove the blockage, repair arteries or veins, and restore blood flow. This information is not intended to replace advice given to you by your health care provider. Make sure you discuss any questions you have with your health care provider. Document Released: 11/17/2010 Document Revised: 03/14/2016 Document Reviewed: 03/14/2016 Elsevier Interactive Patient Education  2017 [employer]  

## 2017-01-12 NOTE — Assessment & Plan Note (Signed)
He had a CT scan of the abdomen and pelvis which I have independently reviewed. This does demonstrate mild to moderate aortic atherosclerosis. The CT scan is the typical 5 mm cutdown at our institution which is not adequate for true assessment of the visceral vessels, but the SMA appears to not have significant stenosis. The celiac may have some stenosis proximally although it is difficult to discern. The IMA is small and I cannot comment on whether or not stenosis is present based off of the suboptimal CT scan.

## 2017-01-12 NOTE — Assessment & Plan Note (Signed)
Sees urology.  Some improvement with treatment of this.

## 2017-01-12 NOTE — Assessment & Plan Note (Signed)
He had a CT scan of the abdomen and pelvis which I have independently reviewed. This does demonstrate mild to moderate aortic atherosclerosis. The CT scan is the typical 5 mm cutdown at our institution which is not adequate for true assessment of the visceral vessels, but the SMA appears to not have significant stenosis. The celiac may have some stenosis proximally although it is difficult to discern. The IMA is small and I cannot comment on whether or not stenosis is present based off of the suboptimal CT scan. I had a long discussion with he and his wife today regarding mesenteric vascular disease. I think it would be prudent to perform a mesenteric duplex although I think his clinical scenario was not typical for chronic visceral ischemia. I have discussed the pathophysiology and natural history of chronic mesenteric ischemia. We will see him back following the noninvasive study to discuss the results and determine further treatment options.

## 2017-01-13 ENCOUNTER — Ambulatory Visit (INDEPENDENT_AMBULATORY_CARE_PROVIDER_SITE_OTHER): Payer: Medicare Other | Admitting: Vascular Surgery

## 2017-01-13 ENCOUNTER — Ambulatory Visit (INDEPENDENT_AMBULATORY_CARE_PROVIDER_SITE_OTHER): Payer: Medicare Other

## 2017-01-13 ENCOUNTER — Encounter (INDEPENDENT_AMBULATORY_CARE_PROVIDER_SITE_OTHER): Payer: Self-pay | Admitting: Vascular Surgery

## 2017-01-13 VITALS — BP 112/70 | HR 68 | Resp 16 | Ht 71.0 in | Wt 160.4 lb

## 2017-01-13 DIAGNOSIS — R1084 Generalized abdominal pain: Secondary | ICD-10-CM

## 2017-01-13 DIAGNOSIS — I714 Abdominal aortic aneurysm, without rupture, unspecified: Secondary | ICD-10-CM

## 2017-01-13 DIAGNOSIS — R1032 Left lower quadrant pain: Secondary | ICD-10-CM | POA: Diagnosis not present

## 2017-01-13 DIAGNOSIS — R1031 Right lower quadrant pain: Secondary | ICD-10-CM | POA: Diagnosis not present

## 2017-01-13 NOTE — Progress Notes (Signed)
Subjective:    Patient ID: [name], male    DOB: April 08, 1951, 66 y.o.   MRN: [id number] Chief Complaint  Patient presents with  . Mesenteric Insufficiency    pt conv ultrasound    Patient presents today to review vascular studies. Patient was last seen yesterday with a chief complaint of chronic abdominal pain. Patient's symptoms are stable. Patient underwent a mesenteric artery duplex exam which was notable for no hemodynamically significant stenosis noted in the celiac or mesenteric arteries. An incidental finding of a proximal aorta measuring 3.21 cm x 3.09 cm was noted. Patient denies any fever, nausea or vomiting.   Review of Systems  Constitutional: Negative.   HENT: Negative.   Eyes: Negative.   Respiratory: Negative.   Cardiovascular: Negative.   Gastrointestinal: Positive for abdominal pain.  Endocrine: Negative.   Genitourinary: Negative.   Musculoskeletal: Negative.   Skin: Negative.   Allergic/Immunologic: Negative.   Neurological: Negative.   Hematological: Negative.   Psychiatric/Behavioral: Negative.       Objective:   Physical Exam  Constitutional: He is oriented to person, place, and time. He appears well-developed and well-nourished. No distress.  HENT:  Head: Normocephalic and atraumatic.  Eyes: Pupils are equal, round, and reactive to light. Conjunctivae are normal.  Neck: Normal range of motion.  Cardiovascular: Normal rate, regular rhythm, normal heart sounds and intact distal pulses.   Pulses:      Radial pulses are 2+ on the right side, and 2+ on the left side.       Dorsalis pedis pulses are 2+ on the right side, and 2+ on the left side.       Posterior tibial pulses are 2+ on the right side, and 2+ on the left side.  Pulmonary/Chest: Effort normal.  Abdominal: Soft. Bowel sounds are normal. He exhibits no distension. There is no tenderness. There is no rebound and no guarding.  Musculoskeletal: Normal range of motion. He exhibits no edema.   Neurological: He is alert and oriented to person, place, and time.  Skin: Skin is warm and dry. He is not diaphoretic.  Psychiatric: He has a normal mood and affect. His behavior is normal. Judgment and thought content normal.  Vitals reviewed.  BP 112/70 (BP Location: Left Arm)   Pulse 68   Resp 16   Ht 5\' 11"  (1.803 m)   Wt 160 lb 6.4 oz (72.8 kg)   BMI 22.37 kg/m   Past Medical History:  Diagnosis Date  . Anemia    prev bone marrow bx 02/11/10  . Anxiety   . Bone cancer ([place])   . Colitis 2004  . Colon polyps   . Depression   . Dysphagia   . GERD (gastroesophageal reflux disease)   . Need for prophylactic vaccination and inoculation against influenza 01/31/2013  . Neuropathy   . Sleep apnea 09-12-10   Uses CPAP  . 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[facility])    Social History   Social History  . Marital status: Married    Spouse name: N/A  . Number of children: N/A  . Years of education: N/A   Occupational History  . Not on file.   Social History Main Topics  . Smoking status: Former Smoker    Quit date: 04/14/2007  . Smokeless tobacco: Never Used  . Alcohol use No  . Drug use: No  . Sexual activity: Not on file   Other Topics Concern  . Not on file  Social History Narrative  . No narrative on file   Past Surgical History:  Procedure Laterality Date  . COLONOSCOPY  03/17/2011   Procedure: COLONOSCOPY;  Surgeon: Jeryl Columbia, MD;  Location: [facility] ENDOSCOPY;  Service: Endoscopy;  Laterality: N/A;  . COLONOSCOPY WITH PROPOFOL N/A 10/07/2016   Procedure: COLONOSCOPY WITH PROPOFOL;  Surgeon: Manya Silvas, MD;  Location[facility] ENDOSCOPY;  Service: Endoscopy;  Laterality: N/A;  . ESOPHAGOGASTRODUODENOSCOPY (EGD) WITH PROPOFOL N/A 10/07/2016   Procedure: ESOPHAGOGASTRODUODENOSCOPY (EGD) WITH PROPOFOL;  Surgeon: Manya Silvas, MD;  Location: [facility] ENDOSCOPY;  Service: Endoscopy;  Laterality: N/A;  . HIP FRACTURE SURGERY   January 2013   R hip  . HIP FRACTURE SURGERY Right 02/2016  . NASAL FRACTURE SURGERY     Family History  Problem Relation Age of Onset  . Prostate cancer Neg Hx   . Bladder Cancer Neg Hx   . Kidney cancer Neg Hx     Allergies  Allergen Reactions  . Ibrutinib Nausea Only      Assessment & Plan:  Patient presents today to review vascular studies. Patient was last seen yesterday with a chief complaint of chronic abdominal pain. Patient's symptoms are stable. Patient underwent a mesenteric artery duplex exam which was notable for no hemodynamically significant stenosis noted in the celiac or mesenteric arteries. An incidental finding of a proximal aorta measuring 3.21 cm x 3.09 cm was noted. Patient denies any fever, nausea or vomiting.  1. AAA (abdominal aortic aneurysm) without rupture (HCC) - Stable Incidental finding of a AAA on duplex today measuring 3.21 cm x 3.09 cm We had a long discussion in regard to anatomy AAA, symptoms and treatment if needed in the future. The patient has an asymptomatic abdominal aortic aneurysm that is less than 4 cm in maximal diameter.  I have reviewed the natural history of abdominal aortic aneurysm and the small risk of rupture for aneurysm less than 5 cm in size.  However, as these small aneurysms tend to enlarge over time, continued surveillance with ultrasound or CT scan is mandatory.  Patient is to follow up in 6 months for repeat duplex is stable we can possibly remove follow-up out yearly The patient's blood pressure is being adequately controlled however I have reviewed the importance of hypertension and lipid control and the importance of continuing his abstinence from tobacco.  The patient is also encouraged to exercise a minimum of 30 minutes 4 times a week.  Should the patient develop new onset abdominal or back pain or signs of peripheral embolization they are instructed to seek medical attention immediately and to alert the physician providing  care that they have an aneurysm.  The patient voices their understanding.  - VAS [place] AAA DUPLEX; Future  2. Bilateral lower abdominal pain - stable Minimal atherosclerotic disease seen on CT reviewed by Dr. Lucky Cowboy Mesenteric duplex with no significant hemodynamic stenosis noted I do not feel his abdominal pain is from mesenteric ischemia Patient was given a copy of the results and encouraged to touch base with his gastroenterologist for further workup If patient were to experience increasing abdominal pain, fever nausea or vomiting I encouraged him to seek medical attention at urgent care or the emergency room.  Current Outpatient Prescriptions on File Prior to Visit  Medication Sig Dispense Refill  . ibuprofen (ADVIL,MOTRIN) 800 MG tablet Take 1 tablet (800 mg total) by mouth every 8 (eight) hours as needed. 28 tablet 0  . pantoprazole (PROTONIX) 40 MG tablet Take 40  mg by mouth daily.    Marland Kitchen PARoxetine (PAXIL) 40 MG tablet Take 40 mg by mouth every morning.    . tamsulosin (FLOMAX) 0.4 MG CAPS capsule Take 1 capsule (0.4 mg total) by mouth daily. 30 capsule 0   No current facility-administered medications on file prior to visit.     There are no Patient Instructions on file for this visit. No Follow-up on file.   Ramesh Moan A Jennifer Payes, PA-C

## 2017-01-20 ENCOUNTER — Encounter: Payer: Self-pay | Admitting: Urology

## 2017-01-20 ENCOUNTER — Ambulatory Visit (INDEPENDENT_AMBULATORY_CARE_PROVIDER_SITE_OTHER): Payer: Medicare Other | Admitting: Urology

## 2017-01-20 VITALS — BP 112/74 | HR 74 | Ht 71.0 in | Wt 158.2 lb

## 2017-01-20 DIAGNOSIS — R1030 Lower abdominal pain, unspecified: Secondary | ICD-10-CM

## 2017-01-20 DIAGNOSIS — R3912 Poor urinary stream: Secondary | ICD-10-CM | POA: Diagnosis not present

## 2017-01-20 NOTE — Progress Notes (Signed)
01/20/2017 8:39 AM   [name] Apr 04, 1951 [id number]  Referring provider: Maryland Pink, MD [address] [facility] [place], [place] [address]  No chief complaint on file.   HPI: 66 year old male who was initially seen and evaluated for possible prostatitis returns a for routine follow-up.  He continues to struggle with with diffuse lower abdominal pain including in his right lower quadrant. This is associated with nausea. It pretty much constant and worsens when he tightened his abdominal wall muscles. It radiated up into his esophagus.  He does not relate his bowel symptoms to urination. Abdominal pain does not improve or worsen with voiding.  Overall, he thinks his symptoms are not improved.  His urinary symptoms today are minimal. He's been taking Flomax. He notes an improvement in his stream. No dysuria or gross hematuria. Postvoid residual at last visit was minimal. No evidence of UTIs.  He has reports of his scrotal pain is improved significantly.  He did have a CT abdomen pelvis with contrast on 12/03/2016 which demonstrates air-fluid levels within bowel with severe diverticulosis but no obvious diverticulitis. No GU pathology identified. No inguinal hernias appreciated.  He does have a personal history of diverticulitis. He was treated for 2 weeks with Cipro, Flagyl by gastroenterologist without improvement of his symptoms.  He has an appointment later today with Dawson Bills, gastroenterology.  Most recent PSA 0.44 on 01/2016.  DRE    PMH: Past Medical History:  Diagnosis Date  . Anemia    prev bone marrow bx 02/11/10  . Anxiety   . Bone cancer ([place])   . Colitis 2004  . Colon polyps   . Depression   . Dysphagia   . GERD (gastroesophageal reflux disease)   . Need for prophylactic vaccination and inoculation against influenza 01/31/2013  . Neuropathy   . Sleep apnea 09-12-10   Uses CPAP  . 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 Waldenstrom's  macroglobulinemia [facility])     Surgical History: Past Surgical History:  Procedure Laterality Date  . COLONOSCOPY  03/17/2011   Procedure: COLONOSCOPY;  Surgeon: Jeryl Columbia, MD;  Location: [facility] ENDOSCOPY;  Service: Endoscopy;  Laterality: N/A;  . COLONOSCOPY WITH PROPOFOL N/A 10/07/2016   Procedure: COLONOSCOPY WITH PROPOFOL;  Surgeon: Manya Silvas, MD;  Location: [facility] ENDOSCOPY;  Service: Endoscopy;  Laterality: N/A;  . ESOPHAGOGASTRODUODENOSCOPY (EGD) WITH PROPOFOL N/A 10/07/2016   Procedure: ESOPHAGOGASTRODUODENOSCOPY (EGD) WITH PROPOFOL;  Surgeon: Manya Silvas, MD;  Location[facility] ENDOSCOPY;  Service: Endoscopy;  Laterality: N/A;  . HIP FRACTURE SURGERY  January 2013   R hip  . HIP FRACTURE SURGERY Right 02/2016  . NASAL FRACTURE SURGERY      Home Medications:  Allergies as of 01/20/2017      Reactions   Ibrutinib Nausea Only      Medication List       Accurate as of 01/20/17  8:39 AM. Always use your most recent med list.          ibuprofen 800 MG tablet Commonly known as:  ADVIL,MOTRIN Take 1 tablet (800 mg total) by mouth every 8 (eight) hours as needed.   pantoprazole 40 MG tablet Commonly known as:  PROTONIX Take 40 mg by mouth daily.   PARoxetine 40 MG tablet Commonly known as:  PAXIL Take 40 mg by mouth every morning.   tamsulosin 0.4 MG Caps capsule Commonly known as:  FLOMAX Take 1 capsule (0.4 mg total) by mouth daily.  Allergies:  Allergies  Allergen Reactions  . Ibrutinib Nausea Only    Family History: Family History  Problem Relation Age of Onset  . Prostate cancer Neg Hx   . Bladder Cancer Neg Hx   . Kidney cancer Neg Hx     Social History:  reports that he quit smoking about 9 years ago. He has never used smokeless tobacco. He reports that he does not drink alcohol or use drugs.  ROS: UROLOGY Frequent Urination?: No Hard to postpone urination?: No Burning/pain with urination?: Yes Get up at night to urinate?:  No Leakage of urine?: No Urine stream starts and stops?: No Trouble starting stream?: No Do you have to strain to urinate?: No Blood in urine?: No Urinary tract infection?: No Sexually transmitted disease?: No Injury to kidneys or bladder?: No Painful intercourse?: No Weak stream?: No Erection problems?: No Penile pain?: No  Gastrointestinal Nausea?: Yes Vomiting?: No Indigestion/heartburn?: No Diarrhea?: No Constipation?: No  Constitutional Fever: No Night sweats?: Yes Weight loss?: No Fatigue?: No  Skin Skin rash/lesions?: No Itching?: No  Eyes Blurred vision?: No Double vision?: No  Ears/Nose/Throat Sore throat?: No Sinus problems?: No  Hematologic/Lymphatic Swollen glands?: No Easy bruising?: No  Cardiovascular Leg swelling?: No Chest pain?: No  Respiratory Cough?: No Shortness of breath?: No  Endocrine Excessive thirst?: No  Musculoskeletal Back pain?: No Joint pain?: No  Neurological Headaches?: No Dizziness?: No  Psychologic Depression?: No Anxiety?: No  Physical Exam: BP 112/74 (BP Location: Left Arm, Patient Position: Sitting, Cuff Size: Normal)   Pulse 74   Ht 5\' 11"  (1.803 m)   Wt 158 lb 3.2 oz (71.8 kg)   BMI 22.06 kg/m   Constitutional:  Alert and oriented, No acute distress. HEENT: [place] AT, moist mucus membranes.  Trachea midline, no masses. Cardiovascular: No clubbing, cyanosis, or edema. Respiratory: Normal respiratory effort, no increased work of breathing. GI: Abdomen is soft.   Diffuse mild lower abdominal tenderness with deep palpation. No rebound or guarding. Unchanged from last visit.   Skin: No rashes, bruises or suspicious lesions. Neurologic: Grossly intact, no focal deficits, moving all 4 extremities. Psychiatric: Normal mood and affect.  Laboratory Data: Lab Results  Component Value Date   WBC 6.9 10/23/2016   HGB 13.0 10/23/2016   HCT 37.7 (L) 10/23/2016   MCV 86.9 10/23/2016   PLT 349 10/23/2016     Lab Results  Component Value Date   CREATININE 0.81 07/24/2016   PSA as above  Urinalysis n/a  Pertinent Imaging: n/a  Assessment & Plan:    1. Lower abdominal pain Resolution of scrotal pain and voiding symptoms Abdominal symptoms unchanged, appear to be unrelated to voiding Not convinced at this point the abdominal pain is related to GU origin Advised to f/u with GI  2. Weak urine stream Improved with flomax Continue this med    Return if symptoms worsen or fail to improve.  Hollice Espy, MD  [facility] [address], [place], [place] [address] [phone]

## 2017-01-22 ENCOUNTER — Ambulatory Visit: Payer: Medicare Other | Admitting: Urology

## 2017-01-25 ENCOUNTER — Encounter: Payer: Self-pay | Admitting: Hematology and Oncology

## 2017-01-25 ENCOUNTER — Inpatient Hospital Stay: Payer: Medicare Other | Attending: Hematology and Oncology | Admitting: Hematology and Oncology

## 2017-01-25 ENCOUNTER — Other Ambulatory Visit: Payer: Self-pay

## 2017-01-25 ENCOUNTER — Inpatient Hospital Stay: Payer: Medicare Other

## 2017-01-25 VITALS — BP 135/85 | HR 70 | Temp 97.2°F | Resp 14 | Wt 162.0 lb

## 2017-01-25 DIAGNOSIS — Z86010 Personal history of colonic polyps: Secondary | ICD-10-CM | POA: Diagnosis not present

## 2017-01-25 DIAGNOSIS — Z23 Encounter for immunization: Secondary | ICD-10-CM | POA: Diagnosis not present

## 2017-01-25 DIAGNOSIS — C880 Waldenstrom macroglobulinemia: Secondary | ICD-10-CM

## 2017-01-25 DIAGNOSIS — K219 Gastro-esophageal reflux disease without esophagitis: Secondary | ICD-10-CM | POA: Diagnosis not present

## 2017-01-25 DIAGNOSIS — Z79899 Other long term (current) drug therapy: Secondary | ICD-10-CM | POA: Diagnosis not present

## 2017-01-25 DIAGNOSIS — F329 Major depressive disorder, single episode, unspecified: Secondary | ICD-10-CM | POA: Insufficient documentation

## 2017-01-25 DIAGNOSIS — G4730 Sleep apnea, unspecified: Secondary | ICD-10-CM | POA: Diagnosis not present

## 2017-01-25 DIAGNOSIS — F419 Anxiety disorder, unspecified: Secondary | ICD-10-CM | POA: Insufficient documentation

## 2017-01-25 DIAGNOSIS — Z87891 Personal history of nicotine dependence: Secondary | ICD-10-CM | POA: Insufficient documentation

## 2017-01-25 DIAGNOSIS — K529 Noninfective gastroenteritis and colitis, unspecified: Secondary | ICD-10-CM

## 2017-01-25 DIAGNOSIS — K5730 Diverticulosis of large intestine without perforation or abscess without bleeding: Secondary | ICD-10-CM | POA: Diagnosis not present

## 2017-01-25 LAB — CBC WITH DIFFERENTIAL/PLATELET
Basophils Absolute: 0.1 10*3/uL (ref 0–0.1)
Basophils Relative: 1 %
Eosinophils Absolute: 0 10*3/uL (ref 0–0.7)
Eosinophils Relative: 1 %
HCT: 38 % — ABNORMAL LOW (ref 40.0–52.0)
Hemoglobin: 12.9 g/dL — ABNORMAL LOW (ref 13.0–18.0)
Lymphocytes Relative: 26 %
Lymphs Abs: 1.7 10*3/uL (ref 1.0–3.6)
MCH: 29.9 pg (ref 26.0–34.0)
MCHC: 34 g/dL (ref 32.0–36.0)
MCV: 88.1 fL (ref 80.0–100.0)
Monocytes Absolute: 0.6 10*3/uL (ref 0.2–1.0)
Monocytes Relative: 9 %
Neutro Abs: 4.3 10*3/uL (ref 1.4–6.5)
Neutrophils Relative %: 63 %
Platelets: 330 10*3/uL (ref 150–440)
RBC: 4.31 MIL/uL — ABNORMAL LOW (ref 4.40–5.90)
RDW: 15.1 % — ABNORMAL HIGH (ref 11.5–14.5)
WBC: 6.7 10*3/uL (ref 3.8–10.6)

## 2017-01-25 MED ORDER — INFLUENZA VAC SPLIT QUAD 0.5 ML IM SUSY
0.5000 mL | PREFILLED_SYRINGE | Freq: Once | INTRAMUSCULAR | Status: AC
  Administered 2017-01-25: 0.5 mL via INTRAMUSCULAR
  Filled 2017-01-25: qty 0.5

## 2017-01-25 NOTE — Progress Notes (Signed)
[facility] day:  01/25/2017  Chief Complaint: [name] is a 66 y.o. male with 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who is seen for 6 month assessment.  HPI:  The patient was last seen in the medical oncology clinic on 07/24/2016.  At that time, he felt good.  He had chronic aching and tingling in his legs.  Exam revealed no adenopathy or hepatosplenomegaly.  IgM was stable.  Abdomen and pelvic CT scan on 12/03/2016 revealed small and large bowel air-fluid levels seen with enteritis, no bowel obstruction.  There was severe sigmoid diverticulosis.  There was mass, sludge or possibly stone within the distal common bile duct without biliary dilatation.  He was noted to have severe diverticulosis. He was prescribed a course of oral antibiotics (ciprofloxacin and Flagyl).  Recommendation was for MRCP.   MRCP on 12/11/2016 that revealed no evidence of choledocholithiasis or biliary dilatation.  Patient has also been seen by Dr. Hollice Espy. Patient notes that she told him to wear different underwear. Patient was diagnosed with what sounds like BPH. He was prescribed Flomax, and was ulimately released.  She recommended follow-up with GI.  He has also been seen by Dr. Lucky Cowboy from vascular surgery.  Symptomatically, he has been "going downhill since his last visit". Patient has had pain in his lower abdomen for the last 2 months. Patient reports that his testicles are "sore" bilaterally. Patient denies urinary symptoms. Patient notes that symptoms started after colonoscopy in June.  Patient to schedule a follow up appointment with Denice Paradise, NP after being seen today. GI has recommended that patient have a VCE study to further evaluate. Patient is hesitant about proceeding with the VCE due to the associated cost.    Patient advising that previously reported that the "aching and tingling" in his legs has resolved. He denies any fevers, sweats or weight loss.   He denies any bruising or bleeding.  He denies any adenopathy. He has had no interval infections.   Past Medical History:  Diagnosis Date  . Anemia    prev bone marrow bx 02/11/10  . Anxiety   . Bone cancer ([place])   . Colitis 2004  . Colon polyps   . Depression   . Dysphagia   . GERD (gastroesophageal reflux disease)   . Need for prophylactic vaccination and inoculation against influenza 01/31/2013  . Neuropathy   . Sleep apnea 09-12-10   Uses CPAP  . 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[place])   . 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[facility])     Past Surgical History:  Procedure Laterality Date  . COLONOSCOPY  03/17/2011   Procedure: COLONOSCOPY;  Surgeon: Jeryl Columbia, MD;  Location: [facility] ENDOSCOPY;  Service: Endoscopy;  Laterality: N/A;  . COLONOSCOPY WITH PROPOFOL N/A 10/07/2016   Procedure: COLONOSCOPY WITH PROPOFOL;  Surgeon: Manya Silvas, MD;  Location: [facility] ENDOSCOPY;  Service: Endoscopy;  Laterality: N/A;  . ESOPHAGOGASTRODUODENOSCOPY (EGD) WITH PROPOFOL N/A 10/07/2016   Procedure: ESOPHAGOGASTRODUODENOSCOPY (EGD) WITH PROPOFOL;  Surgeon: Manya Silvas, MD;  Location: [facility] ENDOSCOPY;  Service: Endoscopy;  Laterality: N/A;  . HIP FRACTURE SURGERY  January 2013   R hip  . HIP FRACTURE SURGERY Right 02/2016  . NASAL FRACTURE SURGERY      Family History  Problem Relation Age of Onset  . Prostate cancer Neg Hx   . Bladder Cancer Neg Hx   . Kidney cancer Neg Hx     Social History:  reports that he quit smoking about 9 years ago.  He has never used smokeless tobacco. He reports that he does not drink alcohol or use drugs.  He lives in [place].  The patient is accompanied by his wife, Stanton Kidney, today.  Allergies:  Allergies  Allergen Reactions  . Ibrutinib Nausea Only    Current Medications: Current Outpatient Prescriptions  Medication Sig Dispense Refill  . acetaminophen (TYLENOL) 500 MG tablet Take 500 mg by mouth every 6 (six) hours as needed.    Marland Kitchen amoxicillin-clavulanate  (AUGMENTIN) 875-125 MG tablet Take 1 tablet by mouth every 12 (twelve) hours.    . pantoprazole (PROTONIX) 40 MG tablet Take 40 mg by mouth 2 (two) times daily.    Marland Kitchen PARoxetine (PAXIL) 40 MG tablet Take 40 mg by mouth every morning.    . sucralfate (CARAFATE) 1 g tablet Take 1 g by mouth 3 x daily with food.     No current facility-administered medications for this visit.     Review of Systems:  GENERAL:  "Going down hill".  No fevers or sweats.  Weight up 4 pounds since last visit. PERFORMANCE STATUS (ECOG):  1 HEENT:  No visual changes, runny nose, sore throat, mouth sores or tenderness. Lungs: No shortness of breath or cough.  No hemoptysis. Cardiac:  No chest pain, palpitations, orthopnea, or PND. GI:  Abdominal pain (see HPI).  No nausea, vomiting, diarrhea, constipation, melena or hematochezia. GU:  No urgency, frequency, dysuria, or hematuria.  Interval evaluation by urology. Musculoskeletal:  No back pain.  s/p right hip replacement.  No muscle tenderness. Extremities:  Resolution of leg ache and tingle.  No swelling. Skin:  No rashes or skin changes. Neuro:  No headache, focal weakness, balance or coordination issues. Endocrine:  No diabetes, thyroid issues, hot flashes or night sweats. Psych:  No mood changes, depression or anxiety. Pain:  Abdominal pain (4 out of 10). Review of systems:  All other systems reviewed and found to be negative.  Physical Exam: Blood pressure 135/85, pulse 70, temperature (!) 97.2 F (36.2 C), temperature source Tympanic, resp. rate 14, weight 162 lb (73.5 kg). GENERAL:  Well developed, well nourished, gentleman sitting comfortably in the exam room in no acute distress. MENTAL STATUS:  Alert and oriented to person, place and time. HEAD:  Pearline Cables hair.  Mustache.  Normocephalic, atraumatic, face symmetric, no Cushingoid features. EYES:  Glasses.  Pupils equal round and reactive to light and accomodation.  No conjunctivitis or scleral icterus. ENT:   Oropharynx clear without lesion.  Tongue normal. Mucous membranes moist.  RESPIRATORY:  Clear to auscultation without rales, wheezes or rhonchi. CARDIOVASCULAR:  Regular rate and rhythm without murmur, rub or gallop. ABDOMEN:  Soft, slightly tender (predominantly mid abdomen) without guarding or rebound.  Active bowel sounds and no hepatosplenomegaly.  No masses. SKIN:  No rashes, ulcers or lesions. EXTREMITIES: No edema, no skin discoloration or tenderness.  No palpable cords. LYMPH NODES: No palpable cervical, supraclavicular, axillary or inguinal adenopathy  PSYCH:  Appropriate.   Orders Only on 01/25/2017  Component Date Value Ref Range Status  . WBC 01/25/2017 6.7  3.8 - 10.6 K/uL Final  . RBC 01/25/2017 4.31* 4.40 - 5.90 MIL/uL Final  . Hemoglobin 01/25/2017 12.9* 13.0 - 18.0 g/dL Final  . HCT 01/25/2017 38.0* 40.0 - 52.0 % Final  . MCV 01/25/2017 88.1  80.0 - 100.0 fL Final  . MCH 01/25/2017 29.9  26.0 - 34.0 pg Final  . MCHC 01/25/2017 34.0  32.0 - 36.0 g/dL Final  . RDW 01/25/2017 15.1* 11.5 -  14.5 % Final  . Platelets 01/25/2017 330  150 - 440 K/uL Final  . Neutrophils Relative % 01/25/2017 63  % Final  . Neutro Abs 01/25/2017 4.3  1.4 - 6.5 K/uL Final  . Lymphocytes Relative 01/25/2017 26  % Final  . Lymphs Abs 01/25/2017 1.7  1.0 - 3.6 K/uL Final  . Monocytes Relative 01/25/2017 9  % Final  . Monocytes Absolute 01/25/2017 0.6  0.2 - 1.0 K/uL Final  . Eosinophils Relative 01/25/2017 1  % Final  . Eosinophils Absolute 01/25/2017 0.0  0 - 0.7 K/uL Final  . Basophils Relative 01/25/2017 1  % Final  . Basophils Absolute 01/25/2017 0.1  0 - 0.1 K/uL Final    Assessment:  [name] is a 66 y.o. male with Waldenstrom's macroglobulinemia.  He was diagnosed with Waldenstrm's macroglobulinemia in 05/2010 after presenting with light headedness/dizziness, headache and visual changes,    He was referred to [facility] at [facility].  Bone marrow was performed (no results  available).  He received weekly Rituxan x 4 followed by maintenance Rituxan x 2 years (last 06/2012).  He was admitted to [facility] with dizziness and vertigo in 09/2013.  Hyperviscosity was suspected but not documented.  He underwent plasmapheresis at [facility] x 1 with significant improvement in symptoms.  He received Rituxan re-induction (weekly x 4).  Treatment completed in 10/2013.  He was noted to have a PET negative nodule in 02/2014.  He was started on ibrutinib, but discontinued in 02/2014 secondary to poor tolerance (GI upset).  Maintenance Rituxan was restarted in 04/18/2014 and discontinued in 02/2015.  IgM trend:  4400 on 02/10/2010, 5130 on 04/03/2010, 5180 on 04/28/2010, 3530 on 08/12/2010, 3350 on 10/23/2010, 2600 on 01/02/2011, 2670 on 02/27/2011, 2210 on 05/08/2011, 1610 on 07/01/2011, 652 on 07/01/2012, 793 on 01/04/2013, 863 on 05/30/2013, 546 on 02/25/2015, 482 on 04/29/2015, 522 on 09/11/2015, 588 on 01/16/2016, 512 on 05/28/2016, 503 on 07/24/2016, and 490 on 10/23/2016.  SPEP has been followed (gm/dL): 3.02 on 02/10/2010, 3.47 on 04/03/2010, 2.25 on 0712/2012, 1.35 on 05/08/2011, 0.60 on 01/04/2013, 0.57 on 05/30/2013, 0.5 on 02/25/2015, 0.4 on 03/14/2015, 0.4 on 04/16/2016, 0.5 on 07/24/2016, and 0.5 on 10/23/2016.  Head MRI on 03/28/2015 revealed no acute intracranial abnormality.  Thoracic spine MRI on 12/15/016 revealed no acute abnormality.  Marrow signal was unremarkable. The central canal and foramina were widely patent at all levels.  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  GM1 antibody and anti-myelin associated glycoprotein (MAG) antibodies were negative.   Abdomen and pelvic CT on 12/03/2016 revealed small and large bowel air-fluid levels seen with enteritis, no bowel obstruction.  There was severe sigmoid diverticulosis.  There was mass, sludge or possibly stone within the distal common bile duct without biliary dilatation.   He had severe diverticulosis. MRCP on 12/11/2016 that revealed no evidence of choledocholithiasis or biliary dilatation.  His leg symptoms have resolved.  Exam reveals mild diffuse mid abdominal pain (right > left).  He denies urinary symptoms.  Exam reveals no adenopathy or hepatosplenomegaly.    Plan: 1.  Labs today:  CBC with diff, CMP, SPEP, IgM. 2.  Discuss influenza vaccination. Blood counts stable. Proceed with vaccination today.  3.  Discuss prostate symptoms. Continue Flomax as prescribed. Follow up with Dr. Erlene Quan as scheduled.  4.  Discuss abdominal pain. Patient may benefit from repeat imaging given that his 11/2016 scan, persistent symptoms and interval treatment. Discuss capsule study. Patient to follow up with  Denice Paradise, NP.  5.  RTC in 3 months for labs (CBC with diff, CMP, SPEP, IgM).  Anticipate labs every 6 months only if remains stable. 6.  RTC in 6 months for MD assessment and labs (CBC with diff, CMP, SPEP, IgM).   Overton Mam, NP 01/25/2017, 5:29 PM   I saw and evaluated the patient, participating in the key portions of the service and reviewing pertinent diagnostic studies and records.  I reviewed the nurse practitioner's note and agree with the findings and the plan.  The assessment and plan were discussed with the patient.  A  few multiple questions were asked by the patient and answered.   Nolon Stalls, MD 01/25/2017,5:29 PM

## 2017-01-25 NOTE — Progress Notes (Signed)
Patient complains of pain in his lower abdomen and right side for about a month. He is also complaining of testicular pain. He states that he has been to numerous doctors but they can't figure out what is causing the pain. He is hoping Dr. Mike Gip can figure it out.

## 2017-01-26 LAB — PROTEIN ELECTROPHORESIS, SERUM
A/G Ratio: 1.1 (ref 0.7–1.7)
Albumin ELP: 3.5 g/dL (ref 2.9–4.4)
Alpha-1-Globulin: 0.2 g/dL (ref 0.0–0.4)
Alpha-2-Globulin: 0.8 g/dL (ref 0.4–1.0)
Beta Globulin: 1 g/dL (ref 0.7–1.3)
Gamma Globulin: 1.1 g/dL (ref 0.4–1.8)
Globulin, Total: 3.1 g/dL (ref 2.2–3.9)
M-Spike, %: 0.4 g/dL — ABNORMAL HIGH
Total Protein ELP: 6.6 g/dL (ref 6.0–8.5)

## 2017-01-26 LAB — IGM: IgM (Immunoglobulin M), Srm: 466 mg/dL — ABNORMAL HIGH (ref 20–172)

## 2017-02-12 DIAGNOSIS — K5730 Diverticulosis of large intestine without perforation or abscess without bleeding: Secondary | ICD-10-CM | POA: Insufficient documentation

## 2017-04-17 ENCOUNTER — Other Ambulatory Visit: Payer: Self-pay

## 2017-04-17 ENCOUNTER — Emergency Department: Payer: Medicare Other

## 2017-04-17 ENCOUNTER — Emergency Department
Admission: EM | Admit: 2017-04-17 | Discharge: 2017-04-17 | Disposition: A | Discharge status: Home / Self Care | Payer: Medicare Other | Attending: Student in an Organized Health Care Education/Training Program | Admitting: Student in an Organized Health Care Education/Training Program

## 2017-04-17 ENCOUNTER — Encounter: Payer: Self-pay | Admitting: Emergency Medicine

## 2017-04-17 DIAGNOSIS — Z87891 Personal history of nicotine dependence: Secondary | ICD-10-CM | POA: Diagnosis not present

## 2017-04-17 DIAGNOSIS — R1031 Right lower quadrant pain: Secondary | ICD-10-CM | POA: Diagnosis not present

## 2017-04-17 DIAGNOSIS — Z79899 Other long term (current) drug therapy: Secondary | ICD-10-CM | POA: Insufficient documentation

## 2017-04-17 DIAGNOSIS — R55 Syncope and collapse: Secondary | ICD-10-CM | POA: Diagnosis not present

## 2017-04-17 LAB — URINALYSIS, COMPLETE (UACMP) WITH MICROSCOPIC
Bacteria, UA: NONE SEEN
Bilirubin Urine: NEGATIVE
Glucose, UA: NEGATIVE mg/dL
Hgb urine dipstick: NEGATIVE
Ketones, ur: 5 mg/dL — AB
LEUKOCYTES UA: NEGATIVE
Nitrite: NEGATIVE
PH: 6 (ref 5.0–8.0)
Protein, ur: NEGATIVE mg/dL
SPECIFIC GRAVITY, URINE: 1.017 (ref 1.005–1.030)

## 2017-04-17 LAB — TROPONIN I

## 2017-04-17 LAB — BASIC METABOLIC PANEL
Anion gap: 11 (ref 5–15)
BUN: 28 mg/dL — AB (ref 6–20)
CHLORIDE: 102 mmol/L (ref 101–111)
CO2: 25 mmol/L (ref 22–32)
CREATININE: 1.07 mg/dL (ref 0.61–1.24)
Calcium: 9.5 mg/dL (ref 8.9–10.3)
GFR calc Af Amer: >60 mL/min (ref >60)
GFR calc non Af Amer: >60 mL/min (ref >60)
Glucose, Bld: 105 mg/dL — ABNORMAL HIGH (ref 65–99)
Potassium: 3.8 mmol/L (ref 3.5–5.1)
SODIUM: 138 mmol/L (ref 135–145)

## 2017-04-17 LAB — CBC
HCT: 41.3 % (ref 40.0–52.0)
Hemoglobin: 14.3 g/dL (ref 13.0–18.0)
MCH: 30.6 pg (ref 26.0–34.0)
MCHC: 34.6 g/dL (ref 32.0–36.0)
MCV: 88.5 fL (ref 80.0–100.0)
PLATELETS: 374 10*3/uL (ref 150–440)
RBC: 4.67 MIL/uL (ref 4.40–5.90)
RDW: 14.5 % (ref 11.5–14.5)
WBC: 9.1 10*3/uL (ref 3.8–10.6)

## 2017-04-17 LAB — GLUCOSE, CAPILLARY: Glucose-Capillary: 101 mg/dL — ABNORMAL HIGH (ref 65–99)

## 2017-04-17 MED ORDER — SODIUM CHLORIDE 0.9 % IV BOLUS (SEPSIS)
1000.0000 mL | Freq: Once | INTRAVENOUS | Status: AC
  Administered 2017-04-17: 1000 mL via INTRAVENOUS

## 2017-04-17 MED ORDER — IOPAMIDOL (ISOVUE-300) INJECTION 61%
100.0000 mL | Freq: Once | INTRAVENOUS | Status: AC | PRN
  Administered 2017-04-17: 100 mL via INTRAVENOUS

## 2017-04-17 MED ORDER — FENTANYL CITRATE (PF) 100 MCG/2ML IJ SOLN
50.0000 ug | INTRAMUSCULAR | Status: DC | PRN
  Administered 2017-04-17: 50 ug via INTRAVENOUS
  Filled 2017-04-17: qty 2

## 2017-04-17 NOTE — ED Triage Notes (Signed)
FIRST NURSE NOTE-generalized weakness with nausea. No facial droop, speech clear, no unilateral sx. Alert and oriented

## 2017-04-17 NOTE — ED Triage Notes (Signed)
Pt to ED from home c/o sudden nausea, mid abd pain, diaphoresis, and weakness to bilateral arms and legs 1.5 PTA.  Pt A&Ox4, speaking in complete and coherent sentences, grip equal and strong bilat hands.  Denies hx of strokes or blood thinners.

## 2017-04-17 NOTE — ED Provider Notes (Signed)
[facility] Emergency Department Provider Note    First MD Initiated Contact with Patient 04/17/17 1956     (approximate)  I have reviewed the triage vital signs and the nursing notes.   HISTORY  Chief Complaint Weakness    HPI [name] is a 67 y.o. male multiple chronic medical conditions presents with generalized weakness feeling associated with "rumbling in his stomach "right lower quadrant abdominal pain.  Patient was playing with his grandchildren on the floor got up to go to the computer to look something up when he had sudden onset of this pain.  There is no radiation of the pain states it was moderate to severe.  Since then has felt nauseated.  No fevers.  Wife states that he did turn very pale when the pain initially started.  He did not pass out.  Patient had CT imaging done out front due to report of weakness due to concern for stroke the patient denies any history of stroke and no lateralized weakness or deficits.  His primary concern is right-sided abdominal pain.  Denies any diarrhea constipation or melena or hematochezia.  Past Medical History:  Diagnosis Date  . Anemia    prev bone marrow bx 02/11/10  . Anxiety   . Bone cancer ([place])   . Colitis 2004  . Colon polyps   . Depression   . Dysphagia   . GERD (gastroesophageal reflux disease)   . Need for prophylactic vaccination and inoculation against influenza 01/31/2013  . Neuropathy   . Sleep apnea 09-12-10   Uses CPAP  . 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[place])   . 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[place])    Family History  Problem Relation Age of Onset  . Prostate cancer Neg Hx   . Bladder Cancer Neg Hx   . Kidney cancer Neg Hx    Past Surgical History:  Procedure Laterality Date  . COLONOSCOPY  03/17/2011   Procedure: COLONOSCOPY;  Surgeon: Jeryl Columbia, MD;  Location: [facility] ENDOSCOPY;  Service: Endoscopy;  Laterality: N/A;  . COLONOSCOPY WITH PROPOFOL N/A 10/07/2016   Procedure:  COLONOSCOPY WITH PROPOFOL;  Surgeon: Manya Silvas, MD;  Location: [facility] ENDOSCOPY;  Service: Endoscopy;  Laterality: N/A;  . ESOPHAGOGASTRODUODENOSCOPY (EGD) WITH PROPOFOL N/A 10/07/2016   Procedure: ESOPHAGOGASTRODUODENOSCOPY (EGD) WITH PROPOFOL;  Surgeon: Manya Silvas, MD;  Location: [facility] ENDOSCOPY;  Service: Endoscopy;  Laterality: N/A;  . HIP FRACTURE SURGERY  January 2013   R hip  . HIP FRACTURE SURGERY Right 02/2016  . NASAL FRACTURE SURGERY     Patient Active Problem List   Diagnosis Date Noted  . Influenza vaccine administered 01/25/2017  . AAA (abdominal aortic aneurysm) without rupture ([place]) 01/13/2017  . Prostatitis 01/12/2017  . Aortic atherosclerosis ([place]) 01/12/2017  . Urinary dysfunction 12/11/2016  . Testicular asymmetry 12/11/2016  . Enteritis 12/11/2016  . Diarrhea, functional 12/11/2016  . Abnormal CT scan, gastrointestinal tract 12/11/2016  . Abdominal pain, diffuse 12/11/2016  . Erosive gastritis 12/03/2016  . Bilateral lower abdominal pain 12/03/2016  . History of colonic polyps 07/27/2016  . Abnormal CAT scan 09/17/2013  . Abnormal CT scan, chest 09/17/2013  . Need for prophylactic vaccination and inoculation against influenza 01/31/2013  . 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[place])   . GERD (gastroesophageal reflux disease)       Prior to Admission medications   Medication Sig Start Date End Date Taking? Authorizing Provider  acetaminophen (TYLENOL) 500 MG tablet Take 500 mg by mouth every 6 (six) hours as needed.  [provider]  pantoprazole (PROTONIX) 40 MG tablet Take 40 mg by mouth 2 (two) times daily. 01/20/17   [provider]  PARoxetine (PAXIL) 40 MG tablet Take 40 mg by mouth every morning.    [provider]  sucralfate (CARAFATE) 1 g tablet Take 1 g by mouth 3 x daily with food. 01/20/17   [provider]    Allergies Ibrutinib    Social History Social History   Tobacco Use  . Smoking  status: Former Smoker    Last attempt to quit: 04/14/2007    Years since quitting: 10.0  . Smokeless tobacco: Never Used  Substance Use Topics  . Alcohol use: No  . Drug use: No    Review of Systems Patient denies headaches, rhinorrhea, blurry vision, numbness, shortness of breath, chest pain, edema, cough, abdominal pain, nausea, vomiting, diarrhea, dysuria, fevers, rashes or hallucinations unless otherwise stated above in HPI. ____________________________________________   PHYSICAL EXAM:  VITAL SIGNS: Vitals:   04/17/17 2230 04/17/17 2300  BP: 124/77 118/76  Pulse: 80 79  Resp: (!) 29 19  Temp:    SpO2: 100% 97%    Constitutional: Alert and oriented. Well appearing and in no acute distress. Eyes: Conjunctivae are normal.  Head: Atraumatic. Nose: No congestion/rhinnorhea. Mouth/Throat: Mucous membranes are moist.   Neck: No stridor. Painless ROM.  Cardiovascular: Normal rate, regular rhythm. Grossly normal heart sounds.  Good peripheral circulation. Respiratory: Normal respiratory effort.  No retractions. Lungs CTAB. Gastrointestinal: Soft with tenderness to palpation right lower quadrant without overlying peritonitis.. No distention. No abdominal bruits. No CVA tenderness. Genitourinary:  Musculoskeletal: No lower extremity tenderness nor edema.  No joint effusions. Neurologic:  Normal speech and language. No gross focal neurologic deficits are appreciated. No facial droop Skin:  Skin is warm, dry and intact. No rash noted. Psychiatric: Mood and affect are normal. Speech and behavior are normal.  ____________________________________________   LABS (all labs ordered are listed, but only abnormal results are displayed)  Results for orders placed or performed during the hospital encounter of 04/17/17 (from the past 24 hour(s))  Basic metabolic panel     Status: Abnormal   Collection Time: 04/17/17  5:59 PM  Result Value Ref Range   Sodium 138 135 - 145 mmol/L    Potassium 3.8 3.5 - 5.1 mmol/L   Chloride 102 101 - 111 mmol/L   CO2 25 22 - 32 mmol/L   Glucose, Bld 105 (H) 65 - 99 mg/dL   BUN 28 (H) 6 - 20 mg/dL   Creatinine, Ser 1.07 0.61 - 1.24 mg/dL   Calcium 9.5 8.9 - 10.3 mg/dL   GFR calc non Af Amer >60 >60 mL/min   GFR calc Af Amer >60 >60 mL/min   Anion gap 11 5 - 15  CBC     Status: None   Collection Time: 04/17/17  5:59 PM  Result Value Ref Range   WBC 9.1 3.8 - 10.6 K/uL   RBC 4.67 4.40 - 5.90 MIL/uL   Hemoglobin 14.3 13.0 - 18.0 g/dL   HCT 41.3 40.0 - 52.0 %   MCV 88.5 80.0 - 100.0 fL   MCH 30.6 26.0 - 34.0 pg   MCHC 34.6 32.0 - 36.0 g/dL   RDW 14.5 11.5 - 14.5 %   Platelets 374 150 - 440 K/uL  Troponin I     Status: None   Collection Time: 04/17/17  5:59 PM  Result Value Ref Range   Troponin I <0.03 <0.03 ng/mL  Urinalysis, Complete w Microscopic     Status: Abnormal   Collection Time: 04/17/17  6:02 PM  Result Value Ref Range   Color, Urine YELLOW (A) YELLOW   APPearance CLEAR (A) CLEAR   Specific Gravity, Urine 1.017 1.005 - 1.030   pH 6.0 5.0 - 8.0   Glucose, UA NEGATIVE NEGATIVE mg/dL   Hgb urine dipstick NEGATIVE NEGATIVE   Bilirubin Urine NEGATIVE NEGATIVE   Ketones, ur 5 (A) NEGATIVE mg/dL   Protein, ur NEGATIVE NEGATIVE mg/dL   Nitrite NEGATIVE NEGATIVE   Leukocytes, UA NEGATIVE NEGATIVE   RBC / HPF 0-5 0 - 5 RBC/hpf   WBC, UA 0-5 0 - 5 WBC/hpf   Bacteria, UA NONE SEEN NONE SEEN   Squamous Epithelial / LPF 0-5 (A) NONE SEEN   Mucus PRESENT   Glucose, capillary     Status: Abnormal   Collection Time: 04/17/17  6:06 PM  Result Value Ref Range   Glucose-Capillary 101 (H) 65 - 99 mg/dL  Troponin I     Status: None   Collection Time: 04/17/17  8:12 PM  Result Value Ref Range   Troponin I <0.03 <0.03 ng/mL   ____________________________________________  EKG My review and personal interpretation at Time: 17:58   Indication: abd pain  Rate: 75  Rhythm: sinus Axis: normal  Other: normal intervals, no stemi,   ____________________________________________  RADIOLOGY  I personally reviewed all radiographic images ordered to evaluate for the above acute complaints and reviewed radiology reports and findings.  These findings were personally discussed with the patient.  Please see medical record for radiology report.  ____________________________________________   PROCEDURES  Procedure(s) performed:  Procedures    Critical Care performed: no ____________________________________________   INITIAL IMPRESSION / ASSESSMENT AND PLAN / ED COURSE  Pertinent labs & imaging results that were available during my care of the patient were reviewed by me and considered in my medical decision making (see chart for details).  DDX: Dehydration, sepsis, appendicitis, colitis, SBO, pancreatitis, cholelithiasis, ACS, dysrhythmia  ARCANGEL MINION is a 66 y.o. who presents to the ED with chief complaint of weakness abdominal pain as described above.  No focal neuro deficits.  CT head shows no abnormality and this does not seem clinically consistent with TIA or stroke.  No evidence of dysrhythmia.  Abdominal exam with mild right lower quadrant tenderness but no leukocytosis and CT imaging ordered for the broad differential shows no evidence of acute intra-abdominal process.  Patient does endorse having decreased p.o. intake for the past day or 2 and has felt lightheaded.  Given IV fluids with improvement in symptoms.  He denies any shortness of breath.  Does not seem clinically consistent with heart failure, pneumonia, pneumothorax or pulmonary embolism.  May have had a variant of vasovagal syncope given the abdominal pain and becoming pale.  His hemoglobin is stable.  No melena.  No evidence of AAA.  At this point I do believe the patient is stable and appropriate for follow-up with his PCP as he is asymptomatic and has no other complaints at this time.  Patient was able to tolerate PO and was able to ambulate with a  steady gait.       ____________________________________________   FINAL CLINICAL IMPRESSION(S) / ED DIAGNOSES  Final diagnoses:  Right lower quadrant abdominal pain  Near syncope      NEW MEDICATIONS STARTED DURING THIS VISIT:  This SmartLink is deprecated. Use AVSMEDLIST instead to display the medication list for a patient.  Note:  This document was prepared using Dragon voice recognition software and may include unintentional dictation errors.    Merlyn Lot, MD 04/17/17 2303

## 2017-04-17 NOTE — Discharge Instructions (Signed)

## 2017-04-26 ENCOUNTER — Inpatient Hospital Stay: Payer: Medicare Other | Attending: Hematology and Oncology

## 2017-04-26 DIAGNOSIS — C880 Waldenstrom macroglobulinemia: Secondary | ICD-10-CM | POA: Insufficient documentation

## 2017-04-26 DIAGNOSIS — Z452 Encounter for adjustment and management of vascular access device: Secondary | ICD-10-CM | POA: Insufficient documentation

## 2017-04-26 LAB — CBC WITH DIFFERENTIAL/PLATELET
Basophils Absolute: 0.1 10*3/uL (ref 0–0.1)
Basophils Relative: 1 %
Eosinophils Absolute: 0.1 10*3/uL (ref 0–0.7)
Eosinophils Relative: 2 %
HCT: 41.5 % (ref 40.0–52.0)
Hemoglobin: 13.8 g/dL (ref 13.0–18.0)
Lymphocytes Relative: 29 %
Lymphs Abs: 1.7 10*3/uL (ref 1.0–3.6)
MCH: 29.5 pg (ref 26.0–34.0)
MCHC: 33.2 g/dL (ref 32.0–36.0)
MCV: 88.9 fL (ref 80.0–100.0)
Monocytes Absolute: 0.5 10*3/uL (ref 0.2–1.0)
Monocytes Relative: 9 %
Neutro Abs: 3.5 10*3/uL (ref 1.4–6.5)
Neutrophils Relative %: 59 %
Platelets: 385 10*3/uL (ref 150–440)
RBC: 4.67 MIL/uL (ref 4.40–5.90)
RDW: 14.4 % (ref 11.5–14.5)
WBC: 5.8 10*3/uL (ref 3.8–10.6)

## 2017-04-26 LAB — COMPREHENSIVE METABOLIC PANEL
ALT: 16 U/L — ABNORMAL LOW (ref 17–63)
AST: 26 U/L (ref 15–41)
Albumin: 4.3 g/dL (ref 3.5–5.0)
Alkaline Phosphatase: 82 U/L (ref 38–126)
Anion gap: 9 (ref 5–15)
BUN: 23 mg/dL — ABNORMAL HIGH (ref 6–20)
CO2: 28 mmol/L (ref 22–32)
Calcium: 9.3 mg/dL (ref 8.9–10.3)
Chloride: 103 mmol/L (ref 101–111)
Creatinine, Ser: 0.97 mg/dL (ref 0.61–1.24)
GFR calc Af Amer: >60 mL/min (ref >60)
GFR calc non Af Amer: >60 mL/min (ref >60)
Glucose, Bld: 100 mg/dL — ABNORMAL HIGH (ref 65–99)
Potassium: 4.4 mmol/L (ref 3.5–5.1)
Sodium: 140 mmol/L (ref 135–145)
Total Bilirubin: 1.1 mg/dL (ref 0.3–1.2)
Total Protein: 7.7 g/dL (ref 6.5–8.1)

## 2017-04-27 LAB — IGM: IgM (Immunoglobulin M), Srm: 498 mg/dL — ABNORMAL HIGH (ref 20–172)

## 2017-04-28 LAB — PROTEIN ELECTROPHORESIS, SERUM
A/G Ratio: 1.2 (ref 0.7–1.7)
Albumin ELP: 3.9 g/dL (ref 2.9–4.4)
Alpha-1-Globulin: 0.2 g/dL (ref 0.0–0.4)
Alpha-2-Globulin: 0.9 g/dL (ref 0.4–1.0)
Beta Globulin: 1.1 g/dL (ref 0.7–1.3)
Gamma Globulin: 1.1 g/dL (ref 0.4–1.8)
Globulin, Total: 3.3 g/dL (ref 2.2–3.9)
M-Spike, %: 0.4 g/dL — ABNORMAL HIGH
Total Protein ELP: 7.2 g/dL (ref 6.0–8.5)

## 2017-07-08 ENCOUNTER — Telehealth: Payer: Self-pay | Admitting: *Deleted

## 2017-07-08 NOTE — Telephone Encounter (Signed)
  We can see him.  I am not sure it is related to his Waldenstrom's.  M

## 2017-07-08 NOTE — Telephone Encounter (Addendum)
Wife called asking that patient appointment be moved up due to his increasing persistent pain. She reports that his PCP has said he can do nothing for him and his Ortho doc cannot see him until 4/12. He is complaining of pain in his hips, going up his back and down both legs. He is "not able to get any peace from it" Requesting that he be seen by Dr Mike Gip sooner than scheduled appointment on 4/15. Please advise

## 2017-07-09 ENCOUNTER — Inpatient Hospital Stay: Payer: Medicare Other

## 2017-07-09 ENCOUNTER — Telehealth: Payer: Self-pay | Admitting: *Deleted

## 2017-07-09 ENCOUNTER — Inpatient Hospital Stay: Payer: Medicare Other | Attending: Hematology and Oncology | Admitting: Hematology and Oncology

## 2017-07-09 VITALS — BP 139/84 | HR 80 | Temp 97.6°F | Resp 18 | Wt 161.1 lb

## 2017-07-09 DIAGNOSIS — C880 Waldenstrom macroglobulinemia: Secondary | ICD-10-CM | POA: Diagnosis present

## 2017-07-09 DIAGNOSIS — M549 Dorsalgia, unspecified: Secondary | ICD-10-CM | POA: Diagnosis not present

## 2017-07-09 DIAGNOSIS — Z87891 Personal history of nicotine dependence: Secondary | ICD-10-CM | POA: Diagnosis not present

## 2017-07-09 DIAGNOSIS — M25551 Pain in right hip: Secondary | ICD-10-CM | POA: Insufficient documentation

## 2017-07-09 LAB — CBC WITH DIFFERENTIAL/PLATELET
Basophils Absolute: 0.1 10*3/uL (ref 0–0.1)
Basophils Relative: 2 %
Eosinophils Absolute: 0.1 10*3/uL (ref 0–0.7)
Eosinophils Relative: 1 %
HCT: 38.7 % — ABNORMAL LOW (ref 40.0–52.0)
Hemoglobin: 13.1 g/dL (ref 13.0–18.0)
Lymphocytes Relative: 31 %
Lymphs Abs: 1.8 10*3/uL (ref 1.0–3.6)
MCH: 30.2 pg (ref 26.0–34.0)
MCHC: 33.9 g/dL (ref 32.0–36.0)
MCV: 89.1 fL (ref 80.0–100.0)
Monocytes Absolute: 0.5 10*3/uL (ref 0.2–1.0)
Monocytes Relative: 8 %
Neutro Abs: 3.5 10*3/uL (ref 1.4–6.5)
Neutrophils Relative %: 58 %
Platelets: 341 10*3/uL (ref 150–440)
RBC: 4.35 MIL/uL — ABNORMAL LOW (ref 4.40–5.90)
RDW: 14.5 % (ref 11.5–14.5)
WBC: 6 10*3/uL (ref 3.8–10.6)

## 2017-07-09 LAB — COMPREHENSIVE METABOLIC PANEL
ALT: 16 U/L — ABNORMAL LOW (ref 17–63)
AST: 22 U/L (ref 15–41)
Albumin: 4 g/dL (ref 3.5–5.0)
Alkaline Phosphatase: 80 U/L (ref 38–126)
Anion gap: 8 (ref 5–15)
BUN: 30 mg/dL — ABNORMAL HIGH (ref 6–20)
CO2: 27 mmol/L (ref 22–32)
Calcium: 9 mg/dL (ref 8.9–10.3)
Chloride: 104 mmol/L (ref 101–111)
Creatinine, Ser: 1.2 mg/dL (ref 0.61–1.24)
GFR calc Af Amer: >60 mL/min (ref >60)
GFR calc non Af Amer: >60 mL/min (ref >60)
Glucose, Bld: 99 mg/dL (ref 65–99)
Potassium: 4.3 mmol/L (ref 3.5–5.1)
Sodium: 139 mmol/L (ref 135–145)
Total Bilirubin: 1 mg/dL (ref 0.3–1.2)
Total Protein: 7.5 g/dL (ref 6.5–8.1)

## 2017-07-09 NOTE — Progress Notes (Signed)
Pt in today for unscheduled visit.  Pt reports having pain in back extending to feet for 2 weeks. Affecting walking, stumbles.

## 2017-07-09 NOTE — Progress Notes (Signed)
[facility] day:  07/09/2017  Chief Complaint: [name] is a 67 y.o. male with 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who is seen for sick call visit.  HPI:  The patient was last seen in the medical oncology clinic on 01/25/2017.  At that time, his leg symptoms had resolved.  Exam revealed mild diffuse mid abdominal pain (right > left).  He denied urinary symptoms.  Exam revealed no adenopathy or hepatosplenomegaly.    At last visit, we discussed follow-up in the GI clinic.  He notes pain in his back that goes down and up his spine x 2 weeks.  He denies any trauma.  He has not sought care with his primary care physician.    He notes "a month ago" he had a spell with sweating and unresponsiveness. He denies seizure activity.  He was "out on the floor for 30 minutes".  He had trouble talking.  He was seen in the [facility] ER for this event on 04/17/2017.  He was seen for weakness.  Report of this visit as noted in the ER visit was discussed with the patient.  Head CT without contrast showed no abnormality.  He received IVF and told to follow-up with his PCP.  He did not.   Past Medical History:  Diagnosis Date  . Anemia    prev bone marrow bx 02/11/10  . Anxiety   . Bone cancer ([place])   . Colitis 2004  . Colon polyps   . Depression   . Dysphagia   . GERD (gastroesophageal reflux disease)   . Need for prophylactic vaccination and inoculation against influenza 01/31/2013  . Neuropathy   . Sleep apnea 09-12-10   Uses CPAP  . 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[place])   . 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[facility])     Past Surgical History:  Procedure Laterality Date  . COLONOSCOPY  03/17/2011   Procedure: COLONOSCOPY;  Surgeon: Jeryl Columbia, MD;  Location: [facility] ENDOSCOPY;  Service: Endoscopy;  Laterality: N/A;  . COLONOSCOPY WITH PROPOFOL N/A 10/07/2016   Procedure: COLONOSCOPY WITH PROPOFOL;  Surgeon: Manya Silvas, MD;  Location: [facility] ENDOSCOPY;   Service: Endoscopy;  Laterality: N/A;  . ESOPHAGOGASTRODUODENOSCOPY (EGD) WITH PROPOFOL N/A 10/07/2016   Procedure: ESOPHAGOGASTRODUODENOSCOPY (EGD) WITH PROPOFOL;  Surgeon: Manya Silvas, MD;  Location: [facility] ENDOSCOPY;  Service: Endoscopy;  Laterality: N/A;  . HIP FRACTURE SURGERY  January 2013   R hip  . HIP FRACTURE SURGERY Right 02/2016  . NASAL FRACTURE SURGERY      Family History  Problem Relation Age of Onset  . Prostate cancer Neg Hx   . Bladder Cancer Neg Hx   . Kidney cancer Neg Hx     Social History:  reports that he quit smoking about 10 years ago. He has never used smokeless tobacco. He reports that he does not drink alcohol or use drugs.  He lives in [place].  The patient is accompanied by his wife, Stanton Kidney, today.  Allergies:  Allergies  Allergen Reactions  . Ibrutinib Nausea Only    Current Medications: Current Outpatient Medications  Medication Sig Dispense Refill  . acetaminophen (TYLENOL) 500 MG tablet Take 500 mg by mouth every 6 (six) hours as needed.    . pantoprazole (PROTONIX) 40 MG tablet Take 40 mg by mouth 2 (two) times daily.    Marland Kitchen PARoxetine (PAXIL) 40 MG tablet Take 40 mg by mouth every morning.     No current facility-administered medications for this visit.  Review of Systems:  GENERAL: No fevers or sweats.  Weight down 1 pound since last visit. PERFORMANCE STATUS (ECOG):  1 HEENT:  No visual changes, runny nose, sore throat, mouth sores or tenderness. Lungs: No shortness of breath or cough.  No hemoptysis. Cardiac:  No chest pain, palpitations, orthopnea, or PND. GI:  No abdominal pain.  No nausea, vomiting, diarrhea, constipation, melena or hematochezia. GU:  No urgency, frequency, dysuria, or hematuria.  Interval evaluation by urology. Musculoskeletal:  Back pain (see HPI).  s/p right hip replacement.  No muscle tenderness. Extremities:  Resolution of leg ache and tingle.  No swelling. Skin:  No rashes or skin changes. Neuro:  No  headache, focal weakness, balance or coordination issues. Endocrine:  No diabetes, thyroid issues, hot flashes or night sweats. Psych:  No mood changes, depression or anxiety. Pain:  Back pain (6 out of 10). Review of systems:  All other systems reviewed and found to be negative.  Physical Exam: Blood pressure 139/84, pulse 80, temperature 97.6 F (36.4 C), resp. rate 18, weight 161 lb 1 oz (73.1 kg). GENERAL:  Well developed, well nourished, gentleman sitting comfortably in the exam room in no acute distress. MENTAL STATUS:  Alert and oriented to person, place and time. HEAD:  Wearing a cap.  Gray hair.  Mustache.  Normocephalic, atraumatic, face symmetric, no Cushingoid features. EYES:  Glasses.  Pupils equal round and reactive to light and accomodation.  No conjunctivitis or scleral icterus. ENT:  Oropharynx clear without lesion.  Tongue normal. Mucous membranes moist.  RESPIRATORY:  Clear to auscultation without rales, wheezes or rhonchi. CARDIOVASCULAR:  Regular rate and rhythm without murmur, rub or gallop. ABDOMEN:  Soft, slightly tender (predominantly mid abdomen) without guarding or rebound.  Active bowel sounds and no hepatosplenomegaly.  No masses. BACK:  Subjective pain on palpation entire back. SKIN:  No rashes, ulcers or lesions. EXTREMITIES: No edema, no skin discoloration or tenderness.  No palpable cords. LYMPH NODES: No palpable cervical, supraclavicular, axillary or inguinal adenopathy  NEUROLOGICAL: Alert & oriented, cranial nerves II-XII intact; motor strength 5/5 throughout except for right hip flexure (4/5) due to pain; sensation intact; finger to nose and RAM normal; gait normal; no clonus or Babinski.  PSYCH:  Appropriate.  Smiling.   No visits with results within 3 Day(s) from this visit.  Latest known visit with results is:  Appointment on 04/26/2017  Component Date Value Ref Range Status  . Total Protein ELP 04/26/2017 7.2  6.0 - 8.5 g/dL Final  . Albumin ELP  04/26/2017 3.9  2.9 - 4.4 g/dL Final  . Alpha-1-Globulin 04/26/2017 0.2  0.0 - 0.4 g/dL Final  . Alpha-2-Globulin 04/26/2017 0.9  0.4 - 1.0 g/dL Final  . Beta Globulin 04/26/2017 1.1  0.7 - 1.3 g/dL Final  . Gamma Globulin 04/26/2017 1.1  0.4 - 1.8 g/dL Final  . M-Spike, % 04/26/2017 0.4* Not Observed g/dL Final  . SPE Interp. 04/26/2017 Comment   Final   Comment: (NOTE) The SPE pattern demonstrates a single peak (M-spike) in the gamma region which may represent monoclonal protein. This peak may also be caused by circulating immune complexes, cryoglobulins, C-reactive protein, fibrinogen or hemolysis.  If clinically indicated, the presence of a monoclonal gammopathy may be confirmed by immuno- fixation, as well as an evaluation of the urine for the presence of Bence-Jones protein. Performed At: [facility] [place], [place] [id number] Rush Docter MD [id number]   . Comment 04/26/2017 Comment   Final  Comment: (NOTE) Protein electrophoresis scan will follow via computer, mail, or courier delivery.   Marland Kitchen GLOBULIN, TOTAL 04/26/2017 3.3  2.2 - 3.9 g/dL Corrected  . A/G Ratio 04/26/2017 1.2  0.7 - 1.7 Corrected   Performed at [facility], [address] [place], [place] [address]  . IgM (Immunoglobulin M), Srm 04/26/2017 498* 20 - 172 mg/dL Final   Comment: (NOTE) Performed At: [facility] [place], [place] [id number] Rush Epler MD QI:[id number] Performed at [facility], [address] [place], [place] [address]   . Sodium 04/26/2017 140  135 - 145 mmol/L Final  . Potassium 04/26/2017 4.4  3.5 - 5.1 mmol/L Final  . Chloride 04/26/2017 103  101 - 111 mmol/L Final  . CO2 04/26/2017 28  22 - 32 mmol/L Final  . Glucose, Bld 04/26/2017 100* 65 - 99 mg/dL Final  . BUN 04/26/2017 23* 6 - 20 mg/dL Final  . Creatinine, Ser 04/26/2017 0.97  0.61 - 1.24 mg/dL Final  . Calcium 04/26/2017 9.3  8.9 - 10.3 mg/dL Final  .  Total Protein 04/26/2017 7.7  6.5 - 8.1 g/dL Final  . Albumin 04/26/2017 4.3  3.5 - 5.0 g/dL Final  . AST 04/26/2017 26  15 - 41 U/L Final  . ALT 04/26/2017 16* 17 - 63 U/L Final  . Alkaline Phosphatase 04/26/2017 82  38 - 126 U/L Final  . Total Bilirubin 04/26/2017 1.1  0.3 - 1.2 mg/dL Final  . GFR calc non Af Amer 04/26/2017 >60  >60 mL/min Final  . GFR calc Af Amer 04/26/2017 >60  >60 mL/min Final   Comment: (NOTE) The eGFR has been calculated using the CKD EPI equation. This calculation has not been validated in all clinical situations. eGFR's persistently <60 mL/min signify possible Chronic Kidney Disease.   Georgiann Hahn gap 04/26/2017 9  5 - 15 Final   Performed at [facility], [address] [place], [place] [address]  . WBC 04/26/2017 5.8  3.8 - 10.6 K/uL Final  . RBC 04/26/2017 4.67  4.40 - 5.90 MIL/uL Final  . Hemoglobin 04/26/2017 13.8  13.0 - 18.0 g/dL Final  . HCT 04/26/2017 41.5  40.0 - 52.0 % Final  . MCV 04/26/2017 88.9  80.0 - 100.0 fL Final  . MCH 04/26/2017 29.5  26.0 - 34.0 pg Final  . MCHC 04/26/2017 33.2  32.0 - 36.0 g/dL Final  . RDW 04/26/2017 14.4  11.5 - 14.5 % Final  . Platelets 04/26/2017 385  150 - 440 K/uL Final  . Neutrophils Relative % 04/26/2017 59  % Final  . Neutro Abs 04/26/2017 3.5  1.4 - 6.5 K/uL Final  . Lymphocytes Relative 04/26/2017 29  % Final  . Lymphs Abs 04/26/2017 1.7  1.0 - 3.6 K/uL Final  . Monocytes Relative 04/26/2017 9  % Final  . Monocytes Absolute 04/26/2017 0.5  0.2 - 1.0 K/uL Final  . Eosinophils Relative 04/26/2017 2  % Final  . Eosinophils Absolute 04/26/2017 0.1  0 - 0.7 K/uL Final  . Basophils Relative 04/26/2017 1  % Final  . Basophils Absolute 04/26/2017 0.1  0 - 0.1 K/uL Final   Performed at [facility], [address], [place], [place] [address]    Assessment:  DEVAL MROCZKA is a 67 y.o. male with Waldenstrom's macroglobulinemia.  He was diagnosed with Waldenstrm's macroglobulinemia in 05/2010 after  presenting with light headedness/dizziness, headache and visual changes,    He was referred to [facility] at  [facility].  Bone marrow was performed (no results available).  He received weekly Rituxan x 4 followed by maintenance Rituxan x 2 years (last 06/2012).  He was admitted to [facility] with dizziness and vertigo in 09/2013.  Hyperviscosity was suspected but not documented.  He underwent plasmapheresis at [facility] x 1 with significant improvement in symptoms.  He received Rituxan re-induction (weekly x 4).  Treatment completed in 10/2013.  He was noted to have a PET negative nodule in 02/2014.  He was started on ibrutinib, but discontinued in 02/2014 secondary to poor tolerance (GI upset).  Maintenance Rituxan was restarted in 04/18/2014 and discontinued in 02/2015.  IgM trend:  4400 on 02/10/2010, 5130 on 04/03/2010, 5180 on 04/28/2010, 3530 on 08/12/2010, 3350 on 10/23/2010, 2600 on 01/02/2011, 2670 on 02/27/2011, 2210 on 05/08/2011, 1610 on 07/01/2011, 652 on 07/01/2012, 793 on 01/04/2013, 863 on 05/30/2013, 546 on 02/25/2015, 482 on 04/29/2015, 522 on 09/11/2015, 588 on 01/16/2016, 512 on 05/28/2016, 503 on 07/24/2016, and 490 on 10/23/2016.  SPEP has been followed (gm/dL): 3.02 on 02/10/2010, 3.47 on 04/03/2010, 2.25 on 0712/2012, 1.35 on 05/08/2011, 0.60 on 01/04/2013, 0.57 on 05/30/2013, 0.5 on 02/25/2015, 0.4 on 03/14/2015, 0.4 on 04/16/2016, 0.5 on 07/24/2016, and 0.5 on 10/23/2016.  Head MRI on 03/28/2015 revealed no acute intracranial abnormality.  Thoracic spine MRI on 12/15/016 revealed no acute abnormality.  Marrow signal was unremarkable. The central canal and foramina were widely patent at all levels.  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  GM1 antibody and anti-myelin associated glycoprotein (MAG) antibodies were negative.   Abdomen and pelvic CT on 12/03/2016 revealed small and large bowel air-fluid levels seen with enteritis, no  bowel obstruction.  There was severe sigmoid diverticulosis.  There was mass, sludge or possibly stone within the distal common bile duct without biliary dilatation.  He had severe diverticulosis. MRCP on 12/11/2016 that revealed no evidence of choledocholithiasis or biliary dilatation.  Symptomatically, he has pain associated with his right hip.    Plan: 1.  Labs today:  CBC with diff, CMP, SPEP, IgM. 2.  Discuss interim events and ER visit.  Encourage patient to follow-up with Dr. Kary Kos. 3.  Discuss current pain unassociated with Waldenstrom's.  Discuss follow-up with Dr. Kary Kos. 4.  Discuss pain in right hip on exam.  Discuss plain film of right hip.  It was then discovered that he had an appointment with Dr Rudene Christians on 07/23/2017 for evaluation of his right hip. 5.  RTC in 6 months for MD assessment and labs (CBC with diff, CMP, SPEP, IgM).   Nolon Stalls, MD 07/09/2017,12:09 PM

## 2017-07-09 NOTE — Telephone Encounter (Signed)
Per Rodena Piety 07/08/17 staff message to schedule patient for MD appointment - first available (pain).   I spoke with patient's wife Stanton Kidney, to see if he was able to come in today @ 11:45. She stated that he was at the [facility] this morning and was unsure of what time he would return because he had 2 appts. there today. I made her Aware that I could put him on the Symptom Management schedule to see a NP She made it very clear that he prefers to see his MD.  I made Bryan-NP aware of what she said, he told me to tell her to make sure she Contacts the office back today as soon as he returns home and he could possibly  Be seen today.

## 2017-07-10 ENCOUNTER — Encounter: Payer: Self-pay | Admitting: Hematology and Oncology

## 2017-07-10 DIAGNOSIS — M25551 Pain in right hip: Secondary | ICD-10-CM | POA: Insufficient documentation

## 2017-07-10 LAB — IGM: IgM (Immunoglobulin M), Srm: 455 mg/dL — ABNORMAL HIGH (ref 20–172)

## 2017-07-12 LAB — PROTEIN ELECTROPHORESIS, SERUM
A/G Ratio: 1.2 (ref 0.7–1.7)
Albumin ELP: 3.7 g/dL (ref 2.9–4.4)
Alpha-1-Globulin: 0.2 g/dL (ref 0.0–0.4)
Alpha-2-Globulin: 0.8 g/dL (ref 0.4–1.0)
Beta Globulin: 1.1 g/dL (ref 0.7–1.3)
Gamma Globulin: 1.1 g/dL (ref 0.4–1.8)
Globulin, Total: 3.2 g/dL (ref 2.2–3.9)
M-Spike, %: 0.5 g/dL — ABNORMAL HIGH
Total Protein ELP: 6.9 g/dL (ref 6.0–8.5)

## 2017-07-16 ENCOUNTER — Encounter (INDEPENDENT_AMBULATORY_CARE_PROVIDER_SITE_OTHER): Payer: Self-pay | Admitting: Vascular Surgery

## 2017-07-16 ENCOUNTER — Ambulatory Visit (INDEPENDENT_AMBULATORY_CARE_PROVIDER_SITE_OTHER): Payer: Medicare Other | Admitting: Vascular Surgery

## 2017-07-16 ENCOUNTER — Ambulatory Visit (INDEPENDENT_AMBULATORY_CARE_PROVIDER_SITE_OTHER): Payer: Medicare Other

## 2017-07-16 VITALS — BP 116/70 | HR 68 | Resp 16 | Ht 71.0 in | Wt 158.2 lb

## 2017-07-16 DIAGNOSIS — I700 Atherosclerosis of aorta: Secondary | ICD-10-CM

## 2017-07-16 DIAGNOSIS — I714 Abdominal aortic aneurysm, without rupture, unspecified: Secondary | ICD-10-CM

## 2017-07-16 NOTE — Progress Notes (Signed)
MRN : [id number]  [name] is a 67 y.o. (Apr 24, 1950) male who presents with chief complaint of  Chief Complaint  Patient presents with  . Follow-up    50monthAAA  .  History of Present Illness: Patient returns today in follow up of his small abdominal aortic aneurysm found at the time of a mesenteric workup.  He is doing well and has no new symptoms or problems since his last visit 6 months ago.  His abdominal aortic aneurysm measures 3.0 cm in maximal diameter which is stable from his previous study.  Past Medical History:  Diagnosis Date  . Anemia    prev bone marrow bx 02/11/10  . Anxiety   . [place] ([place]   . Colitis 2004  . Colon polyps   . Depression   . Dysphagia   . GERD (gastroesophageal reflux disease)   . Need for prophylactic vaccination and inoculation against influenza 01/31/2013  . Neuropathy   . Sleep apnea 09-12-10   Uses CPAP  . 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[place]   . 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 COLONOSCOPY  03/17/2011   Procedure: COLONOSCOPY;  Surgeon: MJeryl Columbia MD;  Location: [facility];  Service: Endoscopy;  Laterality: N/A;  . COLONOSCOPY WITH PROPOFOL N/A 10/07/2016   Procedure: COLONOSCOPY WITH PROPOFOL;  Surgeon: EManya Silvas MD;  [facility];  Service: Endoscopy;  Laterality: N/A;  . ESOPHAGOGASTRODUODENOSCOPY (EGD) WITH PROPOFOL N/A 10/07/2016   Procedure: ESOPHAGOGASTRODUODENOSCOPY (EGD) WITH PROPOFOL;  Surgeon: EManya Silvas MD;  [facility];  Service: Endoscopy;  Laterality: N/A;  . HIP FRACTURE SURGERY  January 2013   R hip  . HIP FRACTURE SURGERY Right 02/2016  . NASAL FRACTURE SURGERY      Family History      Family History  Problem Relation Age of Onset  . Prostate cancer Neg Hx   . Bladder Cancer Neg Hx   . Kidney cancer Neg Hx   No bleeding disorders, clotting disorders,  autoimmune diseases or aneurysms  Social History Social History  Substance Use Topics  . Smoking status: Former Smoker    Quit date: 04/14/2007  . Smokeless tobacco: Never Used  . Alcohol use No  No IVDU      Allergies  Allergen Reactions  . Ibrutinib Nausea Only          Current Outpatient Prescriptions  Medication Sig Dispense Refill  . ibuprofen (ADVIL,MOTRIN) 800 MG tablet Take 1 tablet (800 mg total) by mouth every 8 (eight) hours as needed. 28 tablet 0  . pantoprazole (PROTONIX) 40 MG tablet Take 40 mg by mouth daily.    .Marland KitchenPARoxetine (PAXIL) 40 MG tablet Take 40 mg by mouth every morning.    . tamsulosin (FLOMAX) 0.4 MG CAPS capsule Take 1 capsule (0.4 mg total) by mouth daily. 30 capsule 0   No current facility-administered medications for this visit.       REVIEW OF SYSTEMS (Negative unless checked)  Constitutional: '[]' Weight loss  '[]' Fever  '[]' Chills Cardiac: '[]' Chest pain   '[]' Chest pressure   '[]' Palpitations   '[]' Shortness of breath when laying flat   '[]' Shortness of breath at rest   '[]' Shortness of breath with exertion. Vascular:  '[]' Pain in legs with walking   '[]' Pain in legs at rest   '[]' Pain in legs when laying flat   '[]' Claudication   '[]' Pain in feet when walking  '[]' Pain  in feet at rest  '[]' Pain in feet when laying flat   '[]' History of DVT   '[]' Phlebitis   '[]' Swelling in legs   '[]' Varicose veins   '[]' Non-healing ulcers Pulmonary:   '[]' Uses home oxygen   '[]' Productive cough   '[]' Hemoptysis   '[]' Wheeze  '[]' COPD   '[]' Asthma Neurologic:  '[]' Dizziness  '[]' Blackouts   '[]' Seizures   '[]' History of stroke   '[]' History of TIA  '[]' Aphasia   '[]' Temporary blindness   '[]' Dysphagia   '[]' Weakness or numbness in arms   '[]' Weakness or numbness in legs Musculoskeletal:  '[x]' Arthritis   '[]' Joint swelling   '[]' Joint pain   '[]' Low back pain Hematologic:  '[]' Easy bruising  '[]' Easy bleeding   '[]' Hypercoagulable state   '[x]' Anemic  '[]' Hepatitis Gastrointestinal:  '[]' Blood in stool   '[]' Vomiting blood   '[x]' Gastroesophageal reflux/heartburn   '[x]' Abdominal pain Genitourinary:  '[]' Chronic kidney disease   '[x]' Difficult urination  '[]' Frequent urination  '[]' Burning with urination   '[]' Hematuria Skin:  '[]' Rashes   '[]' Ulcers   '[]' Wounds Psychological:  '[x]' History of anxiety   '[x]'  History of major depression.    Physical Examination  BP 116/70 (BP Location: Right Arm)   Pulse 68   Resp 16   Ht '5\' 11"'  (1.803 m)   Wt 71.8 kg (158 lb 3.2 oz)   BMI 22.06 kg/m  Gen:  WD/WN, NAD.  Appears younger than stated age Head: [place]/AT, No temporalis wasting. Ear/Nose/Throat: Hearing grossly intact, nares w/o erythema or drainage Eyes: Conjunctiva clear. Sclera non-icteric Neck: Supple.  Trachea midline Pulmonary:  Good air movement, no use of accessory muscles.  Cardiac: RRR, no JVD Vascular:  Vessel Right Left  Radial Palpable Palpable                          PT Palpable Palpable  DP Palpable Palpable   Gastrointestinal: soft, non-tender/non-distended.  Aortic impulse not overly increased. Musculoskeletal: M/S 5/5 throughout.  No deformity or atrophy.  Neurologic: Sensation grossly intact in extremities.  Symmetrical.  Speech is fluent.  Psychiatric: Judgment intact, Mood & affect appropriate for pt's clinical situation. Dermatologic: No rashes or ulcers noted.  No cellulitis or open wounds.       Labs Recent Results (from the past 2160 hour(s))  Basic metabolic panel     Status: Abnormal   Collection Time: 04/17/17  5:59 PM  Result Value Ref Range   Sodium 138 135 - 145 mmol/L   Potassium 3.8 3.5 - 5.1 mmol/L   Chloride 102 101 - 111 mmol/L   CO2 25 22 - 32 mmol/L   Glucose, Bld 105 (H) 65 - 99 mg/dL   BUN 28 (H) 6 - 20 mg/dL   Creatinine, Ser 1.07 0.61 - 1.24 mg/dL   Calcium 9.5 8.9 - 10.3 mg/dL   GFR calc non Af Amer >60 >60 mL/min   GFR calc Af Amer >60 >60 mL/min    Comment: (NOTE) The eGFR has been calculated using the CKD EPI equation. This calculation has not been validated  in all clinical situations. eGFR's persistently <60 mL/min signify possible Chronic Kidney Disease.    Anion gap 11 5 - 15    Comment: Performed at [facility], [address] [place], [place] [address]  CBC     Status: None   Collection Time: 04/17/17  5:59 PM  Result Value Ref Range   WBC 9.1 3.8 - 10.6 K/uL   RBC 4.67 4.40 - 5.90 MIL/uL   Hemoglobin 14.3 13.0 - 18.0  g/dL   HCT 41.3 40.0 - 52.0 %   MCV 88.5 80.0 - 100.0 fL   MCH 30.6 26.0 - 34.0 pg   MCHC 34.6 32.0 - 36.0 g/dL   RDW 14.5 11.5 - 14.5 %   Platelets 374 150 - 440 K/uL    Comment: Performed at [facility], [address] [place], [place] [address]  Troponin I     Status: None   Collection Time: 04/17/17  5:59 PM  Result Value Ref Range   Troponin I <0.03 <0.03 ng/mL    Comment: Performed at [facility], [address] [place], [place] [address]  Urinalysis, Complete w Microscopic     Status: Abnormal   Collection Time: 04/17/17  6:02 PM  Result Value Ref Range   Color, Urine YELLOW (A) YELLOW   APPearance CLEAR (A) CLEAR   Specific Gravity, Urine 1.017 1.005 - 1.030   pH 6.0 5.0 - 8.0   Glucose, UA NEGATIVE NEGATIVE mg/dL   Hgb urine dipstick NEGATIVE NEGATIVE   Bilirubin Urine NEGATIVE NEGATIVE   Ketones, ur 5 (A) NEGATIVE mg/dL   Protein, ur NEGATIVE NEGATIVE mg/dL   Nitrite NEGATIVE NEGATIVE   Leukocytes, UA NEGATIVE NEGATIVE   RBC / HPF 0-5 0 - 5 RBC/hpf   WBC, UA 0-5 0 - 5 WBC/hpf   Bacteria, UA NONE SEEN NONE SEEN   Squamous Epithelial / LPF 0-5 (A) NONE SEEN   Mucus PRESENT     Comment: Performed at [facility], [address] [place], [place] [address]  Glucose, capillary     Status: Abnormal   Collection Time: 04/17/17  6:06 PM  Result Value Ref Range   Glucose-Capillary 101 (H) 65 - 99 mg/dL  Troponin I     Status: None   Collection Time: 04/17/17  8:12 PM  Result Value Ref Range   Troponin I <0.03 <0.03 ng/mL    Comment: Performed at [facility], [address] [place],  [address]  Protein electrophoresis, serum     Status: Abnormal   Collection Time: 04/26/17 11:38 AM  Result Value Ref Range   Total Protein ELP 7.2 6.0 - 8.5 g/dL   Albumin ELP 3.9 2.9 - 4.4 g/dL   Alpha-1-Globulin 0.2 0.0 - 0.4 g/dL   Alpha-2-Globulin 0.9 0.4 - 1.0 g/dL   Beta Globulin 1.1 0.7 - 1.3 g/dL   Gamma Globulin 1.1 0.4 - 1.8 g/dL   M-Spike, % 0.4 (H) Not Observed g/dL   SPE Interp. Comment     Comment: (NOTE) The SPE pattern demonstrates a single peak (M-spike) in the gamma region which may represent monoclonal protein. This peak may also be caused by circulating immune complexes, cryoglobulins, C-reactive protein, fibrinogen or hemolysis.  If clinically indicated, the presence of a monoclonal gammopathy may be confirmed by immuno- fixation, as well as an evaluation of the urine for the presence of Bence-Jones protein. Performed At: [facility] [place], [place] [id number] Rush Colter MD [id number]    Comment Comment     Comment: (NOTE) Protein electrophoresis scan will follow via computer, mail, or courier delivery.    GLOBULIN, TOTAL 3.3 2.2 - 3.9 g/dL   A/G Ratio 1.2 0.7 - 1.7    Comment: Performed at [facility], [address] [place],  [address]  IgM     Status: Abnormal   Collection Time: 04/26/17 11:40 AM  Result Value Ref Range   IgM (Immunoglobulin M), Srm 498 (  H) 20 - 172 mg/dL    Comment: (NOTE) Performed At: [facility] [address] [place], [place] [id number] Rush Gundlach MD [id number] Performed at [facility], [address] [place], [place] [address]   Comprehensive metabolic panel     Status: Abnormal   Collection Time: 04/26/17 11:40 AM  Result Value Ref Range   Sodium 140 135 - 145 mmol/L   Potassium 4.4 3.5 - 5.1 mmol/L   Chloride 103 101 - 111 mmol/L   CO2 28 22 - 32 mmol/L   Glucose, Bld 100 (H) 65 - 99 mg/dL   BUN 23  (H) 6 - 20 mg/dL   Creatinine, Ser 0.97 0.61 - 1.24 mg/dL   Calcium 9.3 8.9 - 10.3 mg/dL   Total Protein 7.7 6.5 - 8.1 g/dL   Albumin 4.3 3.5 - 5.0 g/dL   AST 26 15 - 41 U/L   ALT 16 (L) 17 - 63 U/L   Alkaline Phosphatase 82 38 - 126 U/L   Total Bilirubin 1.1 0.3 - 1.2 mg/dL   GFR calc non Af Amer >60 >60 mL/min   GFR calc Af Amer >60 >60 mL/min    Comment: (NOTE) The eGFR has been calculated using the CKD EPI equation. This calculation has not been validated in all clinical situations. eGFR's persistently <60 mL/min signify possible Chronic Kidney Disease.    Anion gap 9 5 - 15    Comment: Performed at [facility], [address] [place], [place] [address]  CBC with Differential/Platelet     Status: None   Collection Time: 04/26/17 11:40 AM  Result Value Ref Range   WBC 5.8 3.8 - 10.6 K/uL   RBC 4.67 4.40 - 5.90 MIL/uL   Hemoglobin 13.8 13.0 - 18.0 g/dL   HCT 41.5 40.0 - 52.0 %   MCV 88.9 80.0 - 100.0 fL   MCH 29.5 26.0 - 34.0 pg   MCHC 33.2 32.0 - 36.0 g/dL   RDW 14.4 11.5 - 14.5 %   Platelets 385 150 - 440 K/uL   Neutrophils Relative % 59 %   Neutro Abs 3.5 1.4 - 6.5 K/uL   Lymphocytes Relative 29 %   Lymphs Abs 1.7 1.0 - 3.6 K/uL   Monocytes Relative 9 %   Monocytes Absolute 0.5 0.2 - 1.0 K/uL   Eosinophils Relative 2 %   Eosinophils Absolute 0.1 0 - 0.7 K/uL   Basophils Relative 1 %   Basophils Absolute 0.1 0 - 0.1 K/uL    Comment: Performed at [facility], [place], [place] [address]  Protein electrophoresis, serum     Status: Abnormal   Collection Time: 07/09/17  1:06 PM  Result Value Ref Range   Total Protein ELP 6.9 6.0 - 8.5 g/dL   Albumin ELP 3.7 2.9 - 4.4 g/dL   Alpha-1-Globulin 0.2 0.0 - 0.4 g/dL   Alpha-2-Globulin 0.8 0.4 - 1.0 g/dL   Beta Globulin 1.1 0.7 - 1.3 g/dL   Gamma Globulin 1.1 0.4 - 1.8 g/dL   M-Spike, % 0.5 (H) Not Observed g/dL   SPE Interp. Comment     Comment: (NOTE) The SPE pattern demonstrates a  single peak (M-spike) in the gamma region which may represent monoclonal protein. This peak may also be caused by circulating immune complexes, cryoglobulins, C-reactive protein, fibrinogen or hemolysis.  If clinically indicated, the presence of a monoclonal gammopathy may be confirmed by immuno- fixation, as well as an evaluation of the urine for  the presence of Bence-Jones protein. Performed At: [facility] [place], [place] [id number] Rush Marschke MD [id number]    Comment Comment     Comment: (NOTE) Protein electrophoresis scan will follow via computer, mail, or courier delivery.    GLOBULIN, TOTAL 3.2 2.2 - 3.9 g/dL   A/G Ratio 1.2 0.7 - 1.7    Comment: Performed at [facility], [address] [place], [place] [address]  IgM     Status: Abnormal   Collection Time: 07/09/17  1:12 PM  Result Value Ref Range   IgM (Immunoglobulin M), Srm 455 (H) 20 - 172 mg/dL    Comment: (NOTE) Performed At: [facility] [place], [place] [id number] Rush Leichter MD [phone] Performed at [facility], [address] [place], [place] [address]   Comprehensive metabolic panel     Status: Abnormal   Collection Time: 07/09/17  1:12 PM  Result Value Ref Range   Sodium 139 135 - 145 mmol/L   Potassium 4.3 3.5 - 5.1 mmol/L   Chloride 104 101 - 111 mmol/L   CO2 27 22 - 32 mmol/L   Glucose, Bld 99 65 - 99 mg/dL   BUN 30 (H) 6 - 20 mg/dL   Creatinine, Ser 1.20 0.61 - 1.24 mg/dL   Calcium 9.0 8.9 - 10.3 mg/dL   Total Protein 7.5 6.5 - 8.1 g/dL   Albumin 4.0 3.5 - 5.0 g/dL   AST 22 15 - 41 U/L   ALT 16 (L) 17 - 63 U/L   Alkaline Phosphatase 80 38 - 126 U/L   Total Bilirubin 1.0 0.3 - 1.2 mg/dL   GFR calc non Af Amer >60 >60 mL/min   GFR calc Af Amer >60 >60 mL/min    Comment: (NOTE) The eGFR has been calculated using the CKD EPI equation. This calculation has not been validated in all clinical situations. eGFR's  persistently <60 mL/min signify possible Chronic Kidney Disease.    Anion gap 8 5 - 15    Comment: Performed at [facility], [address] [place], [place] [address]  CBC with Differential/Platelet     Status: Abnormal   Collection Time: 07/09/17  1:12 PM  Result Value Ref Range   WBC 6.0 3.8 - 10.6 K/uL   RBC 4.35 (L) 4.40 - 5.90 MIL/uL   Hemoglobin 13.1 13.0 - 18.0 g/dL   HCT 38.7 (L) 40.0 - 52.0 %   MCV 89.1 80.0 - 100.0 fL   MCH 30.2 26.0 - 34.0 pg   MCHC 33.9 32.0 - 36.0 g/dL   RDW 14.5 11.5 - 14.5 %   Platelets 341 150 - 440 K/uL   Neutrophils Relative % 58 %   Neutro Abs 3.5 1.4 - 6.5 K/uL   Lymphocytes Relative 31 %   Lymphs Abs 1.8 1.0 - 3.6 K/uL   Monocytes Relative 8 %   Monocytes Absolute 0.5 0.2 - 1.0 K/uL   Eosinophils Relative 1 %   Eosinophils Absolute 0.1 0 - 0.7 K/uL   Basophils Relative 2 %   Basophils Absolute 0.1 0 - 0.1 K/uL    Comment: Performed at [facility], [address], [place], [place] [address]    Radiology No results found.  Assessment/Plan  Aortic atherosclerosis (HCC) Stable without significant stenosis or lifestyle limiting claudication  AAA (abdominal aortic aneurysm) without rupture (HCC) His abdominal aortic aneurysm measures 3.0 cm in maximal diameter which is stable from his previous study. Well below  the threshold for prophylactic repair.  Continue to monitor this but we will stretch that out to an annual basis at this point.    Leotis Pain, MD  07/16/2017 10:05 AM    This note was created with Dragon medical transcription system.  Any errors from dictation are purely unintentional

## 2017-07-16 NOTE — Assessment & Plan Note (Signed)
His abdominal aortic aneurysm measures 3.0 cm in maximal diameter which is stable from his previous study. Well below the threshold for prophylactic repair.  Continue to monitor this but we will stretch that out to an annual basis at this point.

## 2017-07-16 NOTE — Patient Instructions (Signed)
Abdominal Aortic Aneurysm Blood pumps away from the heart through tubes (blood vessels) called arteries. Aneurysms are weak or damaged places in the wall of an artery. It bulges out like a balloon. An abdominal aortic aneurysm happens in the main artery of the body (aorta). It can burst or tear, causing bleeding inside the body. This is an emergency. It needs treatment right away. What are the causes? The exact cause is unknown. Things that could cause this problem include:  Fat and other substances building up in the lining of a tube.  Swelling of the walls of a blood vessel.  Certain tissue diseases.  Belly (abdominal) trauma.  An infection in the main artery of the body.  What increases the risk? There are things that make it more likely for you to have an aneurysm. These include:  Being over the age of 67 years old.  Having high blood pressure (hypertension).  Being a male.  Being white.  Being very overweight (obese).  Having a family history of aneurysm.  Using tobacco products.  What are the signs or symptoms? Symptoms depend on the size of the aneurysm and how fast it grows. There may not be symptoms. If symptoms occur, they can include:  Pain (belly, side, lower back, or groin).  Feeling full after eating a small amount of food.  Feeling sick to your stomach (nauseous), throwing up (vomiting), or both.  Feeling a lump in your belly that feels like it is beating (pulsating).  Feeling like you will pass out (faint).  How is this treated?  Medicine to control blood pressure and pain.  Imaging tests to see if the aneurysm gets bigger.  Surgery. How is this prevented? To lessen your chance of getting this condition:  Stop smoking. Stop chewing tobacco.  Limit or avoid alcohol.  Keep your blood pressure, blood sugar, and cholesterol within normal limits.  Eat less salt.  Eat foods low in saturated fats and cholesterol. These are found in animal and  whole dairy products.  Eat more fiber. Fiber is found in whole grains, vegetables, and fruits.  Keep a healthy weight.  Stay active and exercise often.  This information is not intended to replace advice given to you by your health care provider. Make sure you discuss any questions you have with your health care provider. Document Released: 07/25/2012 Document Revised: 09/05/2015 Document Reviewed: 04/29/2012 Elsevier Interactive Patient Education  2017 [employer].  

## 2017-07-16 NOTE — Assessment & Plan Note (Signed)
Stable without significant stenosis or lifestyle limiting claudication

## 2017-07-26 ENCOUNTER — Other Ambulatory Visit: Payer: Medicare Other

## 2017-07-26 ENCOUNTER — Ambulatory Visit: Payer: Medicare Other | Admitting: Hematology and Oncology

## 2018-01-09 NOTE — Progress Notes (Signed)
[facility] day:  01/10/2018  Chief Complaint: [name] is a 67 y.o. male with 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who is seen for a 6 month assessment.   HPI:  The patient was last seen in the medical oncology clinic on 07/09/2017. At that time, patient complains of pain in his back x 2 weeks. He also complained of pain in his RIGHT hip. He described "a spell" where he became diaphoretic and unresponsive that happened "about a month ago". No seizure activity. Seen in the ED and had a negative head CT. Treated with IVFs and advised to see PCP; he did not. Exam was stable.   He was seen in follow up consult on 07/16/2017 by Dr. Leotis Pain (vascular surgery). Notes reviewed. Patient seen for 6 month evaluation of his AAA. Imaging suggested stability. Aneurysm measured 3.0 cm in diameter. Patient to follow up with vascular in 1 year.   In the interim, he notes lower abdominal pain and headaches.  He has lost weight (11 pounds).  He states that he is eating "everything I want".  He has had nausea x 3 months.  He denies diarrhea.  He notes drenching sweats x 3 months.  He denies any fevers.  He states that his headaches make it hard for him to focus on things.   Past Medical History:  Diagnosis Date  . Anemia    prev bone marrow bx 02/11/10  . Anxiety   . Bone cancer ([place])   . Colitis 2004  . Colon polyps   . Depression   . Dysphagia   . GERD (gastroesophageal reflux disease)   . Need for prophylactic vaccination and inoculation against influenza 01/31/2013  . Neuropathy   . Sleep apnea 09-12-10   Uses CPAP  . 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[employer])   . 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[facility])     Past Surgical History:  Procedure Laterality Date  . COLONOSCOPY  03/17/2011   Procedure: COLONOSCOPY;  Surgeon: Jeryl Columbia, MD;  Location: [facility] ENDOSCOPY;  Service: Endoscopy;  Laterality: N/A;  . COLONOSCOPY WITH PROPOFOL N/A 10/07/2016   Procedure:  COLONOSCOPY WITH PROPOFOL;  Surgeon: Manya Silvas, MD;  Location: [facility] ENDOSCOPY;  Service: Endoscopy;  Laterality: N/A;  . ESOPHAGOGASTRODUODENOSCOPY (EGD) WITH PROPOFOL N/A 10/07/2016   Procedure: ESOPHAGOGASTRODUODENOSCOPY (EGD) WITH PROPOFOL;  Surgeon: Manya Silvas, MD;  Location: [facility] ENDOSCOPY;  Service: Endoscopy;  Laterality: N/A;  . HIP FRACTURE SURGERY  January 2013   R hip  . HIP FRACTURE SURGERY Right 02/2016  . NASAL FRACTURE SURGERY      Family History  Problem Relation Age of Onset  . Prostate cancer Neg Hx   . Bladder Cancer Neg Hx   . Kidney cancer Neg Hx     Social History:  reports that he quit smoking about 10 years ago. He has never used smokeless tobacco. He reports that he does not drink alcohol or use drugs.  He lives in [place].  The patient is accompanied by his wife, Stanton Kidney, today.  Allergies:  Allergies  Allergen Reactions  . Ibrutinib Nausea Only    Current Medications: Current Outpatient Medications  Medication Sig Dispense Refill  . acetaminophen (TYLENOL) 500 MG tablet Take 500 mg by mouth every 6 (six) hours as needed.    . pantoprazole (PROTONIX) 40 MG tablet Take 40 mg by mouth 2 (two) times daily.    Marland Kitchen PARoxetine (PAXIL) 40 MG tablet Take 40 mg by mouth every morning.  No current facility-administered medications for this visit.     Review of Systems  Constitutional: Negative for chills, diaphoresis, fever, malaise/fatigue and weight loss (11 pound).       Drenching sweats x 3 months.  HENT: Negative.  Negative for congestion, ear discharge, ear pain, nosebleeds, sinus pain and tinnitus.   Eyes: Negative for blurred vision, double vision, photophobia, pain, discharge and redness.  Respiratory: Negative.  Negative for cough, hemoptysis, sputum production and shortness of breath.   Cardiovascular: Negative.  Negative for chest pain, palpitations, orthopnea, leg swelling and PND.  Gastrointestinal: Positive for abdominal pain and  nausea. Negative for blood in stool, constipation, diarrhea, melena and vomiting.       Lower abdominal discomfort.  Genitourinary: Negative.  Negative for dysuria, frequency, hematuria and urgency.  Musculoskeletal: Positive for back pain. Negative for falls, joint pain (RIGHT hip s/p replacement), myalgias and neck pain.  Skin: Negative.  Negative for itching and rash.  Neurological: Positive for headaches. Negative for dizziness, tingling, tremors, sensory change, speech change, focal weakness, seizures and weakness.  Endo/Heme/Allergies: Does not bruise/bleed easily.  Psychiatric/Behavioral: Negative for depression and memory loss.  All other systems reviewed and are negative.  Performance status (ECOG): 1 - Symptomatic but completely ambulatory  Vital Signs: BP 136/84 (BP Location: Right Arm, Patient Position: Sitting)   Pulse 73   Temp 98.3 F (36.8 C) (Tympanic)   Resp 18   Wt 150 lb 9 oz (68.3 kg)   BMI 21.00 kg/m   Physical Exam  Constitutional: He is oriented to person, place, and time and well-developed, well-nourished, and in no distress.  HENT:  Head: Normocephalic and atraumatic.  Gray hair and [employer].  Eyes: Pupils are equal, round, and reactive to light. Conjunctivae and EOM are normal. No scleral icterus.  Glasses.  Neck: Normal range of motion. Neck supple. No JVD present.  Cardiovascular: Normal rate, regular rhythm and normal heart sounds. Exam reveals no gallop and no friction rub.  No murmur heard. Pulmonary/Chest: Effort normal and breath sounds normal. No respiratory distress. He has no wheezes. He has no rales.  Abdominal: Soft. Bowel sounds are normal. He exhibits no distension and no mass. There is no tenderness. There is no rebound and no guarding.  Musculoskeletal: Normal range of motion. He exhibits no edema or tenderness.  Lymphadenopathy:    He has no cervical adenopathy.    He has no axillary adenopathy.       Right: No inguinal and no  supraclavicular adenopathy present.       Left: No inguinal and no supraclavicular adenopathy present.  Neurological: He is alert and oriented to person, place, and time.  Skin: Skin is warm and dry. No rash noted. No erythema.  Psychiatric: Mood, affect and judgment normal.  Nursing note and vitals reviewed.   Appointment on 01/10/2018  Component Date Value Ref Range Status  . WBC 01/10/2018 4.9  3.8 - 10.6 K/uL Final  . RBC 01/10/2018 4.55  4.40 - 5.90 MIL/uL Final  . Hemoglobin 01/10/2018 13.7  13.0 - 18.0 g/dL Final  . HCT 01/10/2018 40.5  40.0 - 52.0 % Final  . MCV 01/10/2018 89.0  80.0 - 100.0 fL Final  . MCH 01/10/2018 30.2  26.0 - 34.0 pg Final  . MCHC 01/10/2018 33.9  32.0 - 36.0 g/dL Final  . RDW 01/10/2018 15.2* 11.5 - 14.5 % Final  . Platelets 01/10/2018 363  150 - 440 K/uL Final  . Neutrophils Relative % 01/10/2018 58  %  Final  . Neutro Abs 01/10/2018 2.9  1.4 - 6.5 K/uL Final  . Lymphocytes Relative 01/10/2018 30  % Final  . Lymphs Abs 01/10/2018 1.5  1.0 - 3.6 K/uL Final  . Monocytes Relative 01/10/2018 10  % Final  . Monocytes Absolute 01/10/2018 0.5  0.2 - 1.0 K/uL Final  . Eosinophils Relative 01/10/2018 1  % Final  . Eosinophils Absolute 01/10/2018 0.1  0 - 0.7 K/uL Final  . Basophils Relative 01/10/2018 1  % Final  . Basophils Absolute 01/10/2018 0.1  0 - 0.1 K/uL Final   Performed at [facility], [address], [place], [place] [address]  . Sodium 01/10/2018 139  135 - 145 mmol/L Final  . Potassium 01/10/2018 3.8  3.5 - 5.1 mmol/L Final  . Chloride 01/10/2018 104  98 - 111 mmol/L Final  . CO2 01/10/2018 25  22 - 32 mmol/L Final  . Glucose, Bld 01/10/2018 101* 70 - 99 mg/dL Final  . BUN 01/10/2018 27* 8 - 23 mg/dL Final  . Creatinine, Ser 01/10/2018 0.85  0.61 - 1.24 mg/dL Final  . Calcium 01/10/2018 9.2  8.9 - 10.3 mg/dL Final  . Total Protein 01/10/2018 7.7  6.5 - 8.1 g/dL Final  . Albumin 01/10/2018 4.2  3.5 - 5.0 g/dL Final  . AST 01/10/2018 23   15 - 41 U/L Final  . ALT 01/10/2018 13  0 - 44 U/L Final  . Alkaline Phosphatase 01/10/2018 83  38 - 126 U/L Final  . Total Bilirubin 01/10/2018 1.3* 0.3 - 1.2 mg/dL Final  . GFR calc non Af Amer 01/10/2018 >60  >60 mL/min Final  . GFR calc Af Amer 01/10/2018 >60  >60 mL/min Final   Comment: (NOTE) The eGFR has been calculated using the CKD EPI equation. This calculation has not been validated in all clinical situations. eGFR's persistently <60 mL/min signify possible Chronic Kidney Disease.   Georgiann Hahn gap 01/10/2018 10  5 - 15 Final   Performed at [facility], [address] [place], [place] [address]    Assessment:  LAYMAN GULLY is a 67 y.o. male with Waldenstrom's macroglobulinemia.  He was diagnosed with Waldenstrm's macroglobulinemia in 05/2010 after presenting with light headedness/dizziness, headache and visual changes,    He was referred to [facility].  Bone marrow was performed (no results available).  He received weekly Rituxan x 4 followed by maintenance Rituxan x 2 years (last 06/2012).  He was admitted to [facility] with dizziness and vertigo in 09/2013.  Hyperviscosity was suspected but not documented.  He underwent plasmapheresis at [facility] x 1 with significant improvement in symptoms.  He received Rituxan re-induction (weekly x 4).  Treatment completed in 10/2013.  He was noted to have a PET negative nodule in 02/2014.  He was started on ibrutinib, but discontinued in 02/2014 secondary to poor tolerance (GI upset).  Maintenance Rituxan was restarted in 04/18/2014 and discontinued in 02/2015.  IgM trend:  4400 on 02/10/2010, 5130 on 04/03/2010, 5180 on 04/28/2010, 3530 on 08/12/2010, 3350 on 10/23/2010, 2600 on 01/02/2011, 2670 on 02/27/2011, 2210 on 05/08/2011, 1610 on 07/01/2011, 652 on 07/01/2012, 793 on 01/04/2013, 863 on 05/30/2013, 546 on 02/25/2015, 482 on 04/29/2015, 522 on 09/11/2015, 588 on 01/16/2016, 512 on 05/28/2016, 503 on 07/24/2016,  490 on 10/23/2016, 466 on 01/25/2017, 498 on 04/26/2017, 455 on 07/09/2017, and 460 on 01/10/2018.   SPEP has been followed (gm/dL): 3.02 on 02/10/2010, 3.47 on 04/03/2010, 2.25 on 0712/2012, 1.35 on 05/08/2011,  0.60 on 01/04/2013, 0.57 on 05/30/2013, 0.5 on 02/25/2015, 0.4 on 03/14/2015, 0.4 on 04/16/2016, 0.5 on 07/24/2016, 0.5 on 10/23/2016, 0.4 on 01/25/2017, 0.4 on 04/26/2017, 0.5 on 07/09/2017, and 0.4 on 01/10/2018.  Head MRI on 03/28/2015 revealed no acute intracranial abnormality.  Thoracic spine MRI on 12/15/016 revealed no acute abnormality.  Marrow signal was unremarkable. The central canal and foramina were widely patent at all levels.  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  GM1 antibody and anti-myelin associated glycoprotein (MAG) antibodies were negative.   Abdomen and pelvic CT on 12/03/2016 revealed small and large bowel air-fluid levels seen with enteritis, no bowel obstruction.  There was severe sigmoid diverticulosis.  There was mass, sludge or possibly stone within the distal common bile duct without biliary dilatation.  He had severe diverticulosis. MRCP on 12/11/2016 that revealed no evidence of choledocholithiasis or biliary dilatation.  Symptomatically, he notes a 3 month history of sweats and weight loss.  He notes lower abdominal discomfort.  Exam reveals no adenopathy or hepatosplenomegaly.  Plan: 1. Labs today: CBC with diff, CMP, SPEP, IgM 2. 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No recent infections.   M-spike 0.4 and IgM 460 (stable). 3. B symptoms- new  Concern for 3 month history of drenching sweats and weight loss.   Chest, abdomen, and pelvic CT. 4. Abdominal aortic aneurysm  Recent imaging stable, with AAA measuring 3.0 cm in diameter.   Followed by Dr. Lucky Cowboy (vascular). Plans to see patient in 07/2018.  5. RTC after imaging for MD assessment and review of imaging.   Honor Loh, NP 01/10/2018,11:30 AM    I  saw and evaluated the patient, participating in the key portions of the service and reviewing pertinent diagnostic studies and records.  I reviewed the nurse practitioner's note and agree with the findings and the plan.  The assessment and plan were discussed with the patient.  Several questions were asked by the patient and answered.   Nolon Stalls, MD 01/10/2018,11:30 AM

## 2018-01-10 ENCOUNTER — Inpatient Hospital Stay: Payer: Medicare Other | Attending: Hematology and Oncology

## 2018-01-10 ENCOUNTER — Encounter: Payer: Self-pay | Admitting: Hematology and Oncology

## 2018-01-10 ENCOUNTER — Inpatient Hospital Stay (HOSPITAL_BASED_OUTPATIENT_CLINIC_OR_DEPARTMENT_OTHER): Payer: Medicare Other | Admitting: Hematology and Oncology

## 2018-01-10 VITALS — BP 136/84 | HR 73 | Temp 98.3°F | Resp 18 | Wt 150.6 lb

## 2018-01-10 DIAGNOSIS — Z87891 Personal history of nicotine dependence: Secondary | ICD-10-CM | POA: Insufficient documentation

## 2018-01-10 DIAGNOSIS — C880 Waldenstrom macroglobulinemia: Secondary | ICD-10-CM | POA: Diagnosis present

## 2018-01-10 DIAGNOSIS — R61 Generalized hyperhidrosis: Secondary | ICD-10-CM

## 2018-01-10 DIAGNOSIS — R634 Abnormal weight loss: Secondary | ICD-10-CM

## 2018-01-10 LAB — CBC WITH DIFFERENTIAL/PLATELET
Basophils Absolute: 0.1 10*3/uL (ref 0–0.1)
Basophils Relative: 1 %
Eosinophils Absolute: 0.1 10*3/uL (ref 0–0.7)
Eosinophils Relative: 1 %
HCT: 40.5 % (ref 40.0–52.0)
Hemoglobin: 13.7 g/dL (ref 13.0–18.0)
Lymphocytes Relative: 30 %
Lymphs Abs: 1.5 10*3/uL (ref 1.0–3.6)
MCH: 30.2 pg (ref 26.0–34.0)
MCHC: 33.9 g/dL (ref 32.0–36.0)
MCV: 89 fL (ref 80.0–100.0)
Monocytes Absolute: 0.5 10*3/uL (ref 0.2–1.0)
Monocytes Relative: 10 %
Neutro Abs: 2.9 10*3/uL (ref 1.4–6.5)
Neutrophils Relative %: 58 %
Platelets: 363 10*3/uL (ref 150–440)
RBC: 4.55 MIL/uL (ref 4.40–5.90)
RDW: 15.2 % — ABNORMAL HIGH (ref 11.5–14.5)
WBC: 4.9 10*3/uL (ref 3.8–10.6)

## 2018-01-10 LAB — COMPREHENSIVE METABOLIC PANEL
ALT: 13 U/L (ref 0–44)
AST: 23 U/L (ref 15–41)
Albumin: 4.2 g/dL (ref 3.5–5.0)
Alkaline Phosphatase: 83 U/L (ref 38–126)
Anion gap: 10 (ref 5–15)
BUN: 27 mg/dL — ABNORMAL HIGH (ref 8–23)
CO2: 25 mmol/L (ref 22–32)
Calcium: 9.2 mg/dL (ref 8.9–10.3)
Chloride: 104 mmol/L (ref 98–111)
Creatinine, Ser: 0.85 mg/dL (ref 0.61–1.24)
GFR calc Af Amer: >60 mL/min (ref >60)
GFR calc non Af Amer: >60 mL/min (ref >60)
Glucose, Bld: 101 mg/dL — ABNORMAL HIGH (ref 70–99)
Potassium: 3.8 mmol/L (ref 3.5–5.1)
Sodium: 139 mmol/L (ref 135–145)
Total Bilirubin: 1.3 mg/dL — ABNORMAL HIGH (ref 0.3–1.2)
Total Protein: 7.7 g/dL (ref 6.5–8.1)

## 2018-01-10 NOTE — Progress Notes (Signed)
Patient states his balance is off, he has blurred vision.  States he has non productive cough.  Patient also complains of stomach pain.  Patient accompanied by his wife today.  Patient has had an 8 pound weight loss since April.

## 2018-01-11 LAB — PROTEIN ELECTROPHORESIS, SERUM
A/G Ratio: 1.2 (ref 0.7–1.7)
Albumin ELP: 3.8 g/dL (ref 2.9–4.4)
Alpha-1-Globulin: 0.2 g/dL (ref 0.0–0.4)
Alpha-2-Globulin: 0.8 g/dL (ref 0.4–1.0)
Beta Globulin: 1.1 g/dL (ref 0.7–1.3)
Gamma Globulin: 1 g/dL (ref 0.4–1.8)
Globulin, Total: 3.2 g/dL (ref 2.2–3.9)
M-Spike, %: 0.4 g/dL — ABNORMAL HIGH
Total Protein ELP: 7 g/dL (ref 6.0–8.5)

## 2018-01-11 LAB — IGM: IgM (Immunoglobulin M), Srm: 460 mg/dL — ABNORMAL HIGH (ref 20–172)

## 2018-01-13 ENCOUNTER — Ambulatory Visit
Admission: RE | Admit: 2018-01-13 | Discharge: 2018-01-13 | Disposition: A | Discharge status: Home / Self Care | Payer: Medicare Other | Source: Ambulatory Visit | Attending: Hematology and Oncology | Admitting: Hematology and Oncology

## 2018-01-13 DIAGNOSIS — C8800 Waldenstrom macroglobulinemia not having achieved remission: Secondary | ICD-10-CM

## 2018-01-13 DIAGNOSIS — K5730 Diverticulosis of large intestine without perforation or abscess without bleeding: Secondary | ICD-10-CM | POA: Insufficient documentation

## 2018-01-13 DIAGNOSIS — R61 Generalized hyperhidrosis: Secondary | ICD-10-CM

## 2018-01-13 DIAGNOSIS — R634 Abnormal weight loss: Secondary | ICD-10-CM

## 2018-01-13 MED ORDER — IOPAMIDOL (ISOVUE-300) INJECTION 61%: 80.0000 mL | Freq: Once | INTRAVENOUS | Status: DC | PRN

## 2018-01-13 MED ORDER — IOPAMIDOL (ISOVUE-300) INJECTION 61%
85.0000 mL | Freq: Once | INTRAVENOUS | Status: AC | PRN
  Administered 2018-01-13: 85 mL via INTRAVENOUS

## 2018-01-18 ENCOUNTER — Inpatient Hospital Stay: Payer: Medicare Other | Attending: Hematology and Oncology | Admitting: Hematology and Oncology

## 2018-01-18 ENCOUNTER — Encounter: Payer: Self-pay | Admitting: Hematology and Oncology

## 2018-01-18 VITALS — BP 146/85 | HR 73 | Temp 97.1°F | Resp 18 | Ht 71.0 in | Wt 154.5 lb

## 2018-01-18 DIAGNOSIS — Z87891 Personal history of nicotine dependence: Secondary | ICD-10-CM | POA: Diagnosis not present

## 2018-01-18 DIAGNOSIS — Z79899 Other long term (current) drug therapy: Secondary | ICD-10-CM | POA: Diagnosis not present

## 2018-01-18 DIAGNOSIS — R109 Unspecified abdominal pain: Secondary | ICD-10-CM

## 2018-01-18 DIAGNOSIS — C880 Waldenstrom macroglobulinemia: Secondary | ICD-10-CM | POA: Diagnosis present

## 2018-01-18 DIAGNOSIS — I714 Abdominal aortic aneurysm, without rupture: Secondary | ICD-10-CM | POA: Insufficient documentation

## 2018-01-18 NOTE — Progress Notes (Addendum)
[facility] day:  01/18/2018  Chief Complaint: [name] is a 67 y.o. male with 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who is seen for a 6 month assessment.   HPI:  The patient was last seen in the medical oncology clinic on 01/10/2018. At that time, he noted a 3 month history of sweats and weight loss.  He described lower abdominal discomfort.  Exam revealed no adenopathy or hepatosplenomegaly.  M-spike was 0.4 gm/dL.  IgM was 460.  Chest, abdomen, and pelvic CT on 01/13/2018 revealed no significant abdominal/pelvic findings. There was no acute inflammatory changes, mass lesions or adenopathy. There was dense biapical pleural and parenchymal scarring changes (stable since 12/03/2016) but no worrisome lung lesions or acute pulmonary findings.  There was no mediastinal or hilar mass or adenopathy.  There was sigmoid diverticulosis without findings for acute diverticulitis.  During the interim, patient continues to have lower abdominal pain. He has associated nausea. Bowels are moving regularly. Patient notes that he gets up many days at 3 or 4 in the morning and has to go lay on the couch or recliner due to the aforementioned pain. He denies any urinary symptoms or fevers.  Patient became upset when CT scan results reviewed. He states, "There goes some more money wasted. You are telling me there is nothing wrong. I got up out of bed yesterday and could not stand up. There is something wrong". As recommendations discussed, patient states, "Here we go again. She is wanting to send me somewhere. I don't want anything else". Discussed seeing gastroenterologist again, however patient notes that he does not want to see Harold Paradise, NP again. Due to patient's pain, wife notes that he has developed "an attitude" and is becoming "grouchy". Wife states, "He thinks that he is going to die. He has to get some relief. He is hard to live with because of all of this".    Patient advises that he maintains an adequate appetite. He is eating well. Weight today is 154 lb 8 oz (70.1 kg), which compared to his last visit to the clinic, represents a 4 pound increase.   Patient complains of pain rated 3/10 in the clinic today.   Past Medical History:  Diagnosis Date  . Anemia    prev bone marrow bx 02/11/10  . Anxiety   . Bone cancer ([place])   . Colitis 2004  . Colon polyps   . Depression   . Dysphagia   . GERD (gastroesophageal reflux disease)   . Need for prophylactic vaccination and inoculation against influenza 01/31/2013  . Neuropathy   . Sleep apnea 09-12-10   Uses CPAP  . 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[facility])     Past Surgical History:  Procedure Laterality Date  . COLONOSCOPY  03/17/2011   Procedure: COLONOSCOPY;  Surgeon: Jeryl Columbia, MD;  Location: [facility] ENDOSCOPY;  Service: Endoscopy;  Laterality: N/A;  . COLONOSCOPY WITH PROPOFOL N/A 10/07/2016   Procedure: COLONOSCOPY WITH PROPOFOL;  Surgeon: Manya Silvas, MD;  Location: [facility] ENDOSCOPY;  Service: Endoscopy;  Laterality: N/A;  . ESOPHAGOGASTRODUODENOSCOPY (EGD) WITH PROPOFOL N/A 10/07/2016   Procedure: ESOPHAGOGASTRODUODENOSCOPY (EGD) WITH PROPOFOL;  Surgeon: Manya Silvas, MD;  Location: [facility] ENDOSCOPY;  Service: Endoscopy;  Laterality: N/A;  . HIP FRACTURE SURGERY  January 2013   R hip  . HIP FRACTURE SURGERY Right 02/2016  . NASAL FRACTURE SURGERY      Family History  Problem Relation Age of Onset  . Prostate cancer Neg Hx   .  Bladder Cancer Neg Hx   . Wood cancer Neg Hx     Social History:  reports that he quit smoking about 10 years ago. He has never used smokeless tobacco. He reports that he does not drink alcohol or use drugs.  He lives in [place].  The patient is accompanied by his wife, [name], today.  Allergies:  Allergies  Allergen Reactions  . Ibrutinib Nausea Only    Current Medications: Current Outpatient Medications  Medication Sig Dispense Refill  .  pantoprazole (PROTONIX) 40 MG tablet Take 40 mg by mouth daily.     Marland Kitchen PARoxetine (PAXIL) 40 MG tablet Take 40 mg by mouth every morning.    Marland Kitchen acetaminophen (TYLENOL) 500 MG tablet Take 500 mg by mouth every 6 (six) hours as needed.     No current facility-administered medications for this visit.     Review of Systems  Constitutional: Positive for diaphoresis (x 3 months). Negative for fever, malaise/fatigue and weight loss (up 4 pounds).  HENT: Negative.   Eyes: Negative.   Respiratory: Negative for cough, hemoptysis, sputum production and shortness of breath.   Cardiovascular: Negative for chest pain, palpitations, orthopnea, leg swelling and PND.  Gastrointestinal: Positive for abdominal pain (lower) and nausea. Negative for blood in stool, constipation, diarrhea, melena and vomiting.  Genitourinary: Negative for dysuria, frequency, hematuria and urgency.  Musculoskeletal: Positive for back pain and joint pain (RIGHT hip s/p replacement). Negative for falls and myalgias.  Skin: Negative for itching and rash.  Neurological: Positive for headaches. Negative for dizziness, tremors and weakness.  Endo/Heme/Allergies: Does not bruise/bleed easily.  Psychiatric/Behavioral: Negative for depression, memory loss and suicidal ideas. The patient is not nervous/anxious and does not have insomnia.   All other systems reviewed and are negative.  Performance status (ECOG): 1 - Symptomatic but completely ambulatory  Vital Signs: BP (!) 146/85 (Patient Position: Sitting)   Pulse 73   Temp (!) 97.1 F (36.2 C) (Tympanic)   Resp 18   Ht _0  (1.803 m)   Wt 154 lb 8 oz (70.1 kg)   BMI 21.55 kg/m   Physical Exam  Constitutional: He is oriented to person, place, and time and well-developed, well-nourished, and in no distress. No distress.  HENT:  Head: Normocephalic and atraumatic.  Gray hair and mustache  Eyes: Conjunctivae and EOM are normal. No scleral icterus.  Glasses  Neurological: He  is alert and oriented to person, place, and time. Gait normal.  Skin: He is not diaphoretic.  Psychiatric: Mood, affect and judgment normal.  Nursing note and vitals reviewed.   No visits with results within 3 Day(s) from this visit.  Latest known visit with results is:  Appointment on 01/10/2018  Component Date Value Ref Range Status  . Total Protein ELP 01/10/2018 7.0  6.0 - 8.5 g/dL Final  . Albumin ELP 01/10/2018 3.8  2.9 - 4.4 g/dL Final  . Alpha-1-Globulin 01/10/2018 0.2  0.0 - 0.4 g/dL Final  . Alpha-2-Globulin 01/10/2018 0.8  0.4 - 1.0 g/dL Final  . Beta Globulin 01/10/2018 1.1  0.7 - 1.3 g/dL Final  . Gamma Globulin 01/10/2018 1.0  0.4 - 1.8 g/dL Final  . M-Spike, % 01/10/2018 0.4* Not Observed g/dL Final  . SPE Interp. 01/10/2018 Comment   Final   Comment: (NOTE) The SPE pattern demonstrates a single peak (M-spike) in the gamma region which may represent monoclonal protein. This peak may also be caused by circulating immune complexes, cryoglobulins, C-reactive protein, fibrinogen or hemolysis.  If  clinically indicated, the presence of a monoclonal gammopathy may be confirmed by immuno- fixation, as well as an evaluation of the urine for the presence of Bence-Jones protein. Performed At: [facility] [place], [place] [id number] Rush Senteno MD EH:[id number]   . Comment 01/10/2018 Comment   Final   Comment: (NOTE) Protein electrophoresis scan will follow via computer, mail, or courier delivery.   Marland Kitchen GLOBULIN, TOTAL 01/10/2018 3.2  2.2 - 3.9 g/dL Corrected  . A/G Ratio 01/10/2018 1.2  0.7 - 1.7 Corrected  . WBC 01/10/2018 4.9  3.8 - 10.6 K/uL Final  . RBC 01/10/2018 4.55  4.40 - 5.90 MIL/uL Final  . Hemoglobin 01/10/2018 13.7  13.0 - 18.0 g/dL Final  . HCT 01/10/2018 40.5  40.0 - 52.0 % Final  . MCV 01/10/2018 89.0  80.0 - 100.0 fL Final  . MCH 01/10/2018 30.2  26.0 - 34.0 pg Final  . MCHC 01/10/2018 33.9  32.0 - 36.0 g/dL Final  . RDW  01/10/2018 15.2* 11.5 - 14.5 % Final  . Platelets 01/10/2018 363  150 - 440 K/uL Final  . Neutrophils Relative % 01/10/2018 58  % Final  . Neutro Abs 01/10/2018 2.9  1.4 - 6.5 K/uL Final  . Lymphocytes Relative 01/10/2018 30  % Final  . Lymphs Abs 01/10/2018 1.5  1.0 - 3.6 K/uL Final  . Monocytes Relative 01/10/2018 10  % Final  . Monocytes Absolute 01/10/2018 0.5  0.2 - 1.0 K/uL Final  . Eosinophils Relative 01/10/2018 1  % Final  . Eosinophils Absolute 01/10/2018 0.1  0 - 0.7 K/uL Final  . Basophils Relative 01/10/2018 1  % Final  . Basophils Absolute 01/10/2018 0.1  0 - 0.1 K/uL Final   Performed at [facility], [address], [place], [place] [address]  . Sodium 01/10/2018 139  135 - 145 mmol/L Final  . Potassium 01/10/2018 3.8  3.5 - 5.1 mmol/L Final  . Chloride 01/10/2018 104  98 - 111 mmol/L Final  . CO2 01/10/2018 25  22 - 32 mmol/L Final  . Glucose, Bld 01/10/2018 101* 70 - 99 mg/dL Final  . BUN 01/10/2018 27* 8 - 23 mg/dL Final  . Creatinine, Ser 01/10/2018 0.85  0.61 - 1.24 mg/dL Final  . Calcium 01/10/2018 9.2  8.9 - 10.3 mg/dL Final  . Total Protein 01/10/2018 7.7  6.5 - 8.1 g/dL Final  . Albumin 01/10/2018 4.2  3.5 - 5.0 g/dL Final  . AST 01/10/2018 23  15 - 41 U/L Final  . ALT 01/10/2018 13  0 - 44 U/L Final  . Alkaline Phosphatase 01/10/2018 83  38 - 126 U/L Final  . Total Bilirubin 01/10/2018 1.3* 0.3 - 1.2 mg/dL Final  . GFR calc non Af Amer 01/10/2018 >60  >60 mL/min Final  . GFR calc Af Amer 01/10/2018 >60  >60 mL/min Final   Comment: (NOTE) The eGFR has been calculated using the CKD EPI equation. This calculation has not been validated in all clinical situations. eGFR's persistently <60 mL/min signify possible Chronic Wood Disease.   Georgiann Hahn gap 01/10/2018 10  5 - 15 Final   Performed at [facility], [address] [place], [place] [address]  . IgM (Immunoglobulin M), Srm 01/10/2018 460* 20 - 172 mg/dL Final   Comment: (NOTE) Performed  At: [facility] [place], [place] [id number] Rush Pinkhasov MD [id number]     Assessment:  ULES MARSALA is a 67 y.o. male with Waldenstrom's macroglobulinemia.  He was diagnosed with Waldenstrm's macroglobulinemia in 05/2010 after presenting with light headedness/dizziness, headache and visual changes,    He was referred to [facility].  Bone marrow was performed (no results available).  He received weekly Rituxan x 4 followed by maintenance Rituxan x 2 years (last 06/2012).  He was admitted to [facility] with dizziness and vertigo in 09/2013.  Hyperviscosity was suspected but not documented.  He underwent plasmapheresis at [facility] x 1 with significant improvement in symptoms.  He received Rituxan re-induction (weekly x 4).  Treatment completed in 10/2013.  He was noted to have a PET negative nodule in 02/2014.  He was started on ibrutinib, but discontinued in 02/2014 secondary to poor tolerance (GI upset).  Maintenance Rituxan was restarted in 04/18/2014 and discontinued in 02/2015.  IgM trend:  4400 on 02/10/2010, 5130 on 04/03/2010, 5180 on 04/28/2010, 3530 on 08/12/2010, 3350 on 10/23/2010, 2600 on 01/02/2011, 2670 on 02/27/2011, 2210 on 05/08/2011, 1610 on 07/01/2011, 652 on 07/01/2012, 793 on 01/04/2013, 863 on 05/30/2013, 546 on 02/25/2015, 482 on 04/29/2015, 522 on 09/11/2015, 588 on 01/16/2016, 512 on 05/28/2016, 503 on 07/24/2016, 490 on 10/23/2016, 466 on 01/25/2017, 498 on 04/26/2017, 455 on 07/09/2017, and 460 on 01/10/2018.   SPEP has been followed (gm/dL): 3.02 on 02/10/2010, 3.47 on 04/03/2010, 2.25 on 0712/2012, 1.35 on 05/08/2011, 0.60 on 01/04/2013, 0.57 on 05/30/2013, 0.5 on 02/25/2015, 0.4 on 03/14/2015, 0.4 on 04/16/2016, 0.5 on 07/24/2016, 0.5 on 10/23/2016, 0.4 on 01/25/2017, 0.4 on 04/26/2017, 0.5 on 07/09/2017, and 0.4 on 01/10/2018.  Head MRI on 03/28/2015 revealed no acute intracranial abnormality.  Thoracic spine MRI on  12/15/016 revealed no acute abnormality.  Marrow signal was unremarkable. The central canal and foramina were widely patent at all levels.  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  GM1 antibody and anti-myelin associated glycoprotein (MAG) antibodies were negative.   Abdomen and pelvic CT on 12/03/2016 revealed small and large bowel air-fluid levels seen with enteritis, no bowel obstruction.  There was severe sigmoid diverticulosis.  There was mass, sludge or possibly stone within the distal common bile duct without biliary dilatation.  He had severe diverticulosis. MRCP on 12/11/2016 that revealed no evidence of choledocholithiasis or biliary dilatation.  Chest, abdomen, and pelvic CT on 01/13/2018 revealed no significant abdominal/pelvic findings. There was no acute inflammatory changes, mass lesions or adenopathy. There was dense biapical pleural and parenchymal scarring changes (stablesince 12/03/2016) but no worrisome lung lesions or acute pulmonary findings.  There was no mediastinal or hilar mass or adenopathy.  There was sigmoid diverticulosis without findings for acute diverticulitis.  Symptomatically, patient continues to have significant abdominal pain and nausea. Bowels stable; no constipation or diarrhea. Eating well; weight up 4 pounds. Exam is grossly unremarkable.   Plan: 1. 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Review labs from last visit.   M-spike 0.4 and IgM 460 (stable). 2. Abdominal pain  CT scan negative for possible causative factors.  Similar complaints in the past. Seen by GI Harold Redden, NP).  Asking to be referred to another GI practice. Will refer to [facility]. Will call and try to have appointment expedited.  3. B symptoms- new  Concern for 3 month history of drenching sweats and weight loss.   Review chest, abdomen, and pelvic CT.  Images personally reviewed.  Agree with radiology interpretation.  Etiology unclear.   Follow-up with PCP. 4. Abdominal aortic aneurysm  Recent imaging stable, with AAA measuring 3.0 cm in diameter.   Followed by Dr.  Dew (vascular). Plans to see patient in 07/2018.  5. RTC in 6 months for MD assessment and labs (CBC with diff, CMP, SPEP, IgM).   Honor Loh, NP 01/18/2018,10:35 AM    I saw and evaluated the patient, participating in the key portions of the service and reviewing pertinent diagnostic studies and records.  I reviewed the nurse practitioner's note and agree with the findings and the plan.  The assessment and plan were discussed with the patient.  Several questions were asked by the patient and answered.   Nolon Stalls, MD 01/18/2018,10:35 AM

## 2018-01-20 ENCOUNTER — Encounter: Payer: Self-pay | Admitting: Gastroenterology

## 2018-01-20 ENCOUNTER — Ambulatory Visit (INDEPENDENT_AMBULATORY_CARE_PROVIDER_SITE_OTHER): Payer: Medicare Other | Admitting: Gastroenterology

## 2018-01-20 ENCOUNTER — Other Ambulatory Visit: Payer: Self-pay

## 2018-01-20 ENCOUNTER — Telehealth: Payer: Self-pay

## 2018-01-20 VITALS — BP 118/75 | HR 89 | Ht 71.0 in | Wt 152.2 lb

## 2018-01-20 DIAGNOSIS — R1084 Generalized abdominal pain: Secondary | ICD-10-CM

## 2018-01-20 DIAGNOSIS — K3189 Other diseases of stomach and duodenum: Secondary | ICD-10-CM | POA: Diagnosis not present

## 2018-01-20 DIAGNOSIS — K31A0 Gastric intestinal metaplasia, unspecified: Secondary | ICD-10-CM

## 2018-01-20 DIAGNOSIS — R140 Abdominal distension (gaseous): Secondary | ICD-10-CM

## 2018-01-20 NOTE — Telephone Encounter (Signed)
Called pt to inform him of HIDA scan appointment information. LVM to return call

## 2018-01-20 NOTE — Progress Notes (Signed)
Jonathon Bellows MD, MRCP(U.K) [address]  [place]  [place], [place] [address]  Main: [phone]  Fax: [phone]   Gastroenterology Consultation  Referring Provider:     Maryland Pink, MD Primary Care Physician:  Maryland Pink, MD Primary Gastroenterologist:  Dr. Jonathon Bellows  Reason for Consultation:     Abdominal pain         HPI:   [name] is a 67 y.o. y/o male referred for consultation & management  by Dr. Maryland Pink, MD.     Prior notes, tests and imaging - reviewed and summarized   He has been referred by Dr Mike Gip for an urgent visit for abdominal pain. He has previously been a patient of [facility] in the past . H/o colon polyps. H/o dysphagia , been on a PP for many years.   Last colonoscopy 09/2016 - adenomas resected, diverticulosis and internal hemorroids noted.   EGD 09/2016 showed LA grade A esophagitis .Intestinal metaplaisa of the stomach with no H pylori . MRCP 11/2017 showed no stons in CBD or no dilation although prior CT scan showed obstruction in CBD. He has been treated for diverticulitis in the past.    He was recently seen by Dr Mike Gip for [facility] macroglobulinemia and expressed interest to switch over to a new GI for another evaluation.    01/13/18 : CT abdomen shows no acute abnormality in the abdomen .     Abdominal pain:c Onset: Initially began after his colonoscopy and EGD in 2018 . Constant -wakes him up in the night with the pain . After his tests he says that  It got better- returned 3 months, presently is continious  Site :RLQ and goes across the abdomen , sometimes he is bent over  Radiation:  Severity :severe Petra Kuba of pain: like a knife Aggravating factors:  Fast food and greasy food makes his pain worse.   Relieving factors :not sure Weight loss: 11 lbs over the last 3 months  NSAID use: none  PPI use :protonix 40 mg one a day  Gall bladder surgery: still intact  Frequency of bowel movements: used to go  once a day - sometimesgoes twice a day - soft  Change in bowel movements: yes Relief with bowel movements: no  Gas/Bloating/Abdominal distension: some gas , he has abdominal distension , more pain when more swollen up   Smells when he passes gas, feels much better when he passes gas  No diet sodas, no cystalite, no artiicial sugar, no sweets     Past Medical History:  Diagnosis Date  . Anemia    prev bone marrow bx 02/11/10  . Anxiety   . Bone cancer ([place])   . Colitis 2004  . Colon polyps   . Depression   . Dysphagia   . GERD (gastroesophageal reflux disease)   . Need for prophylactic vaccination and inoculation against influenza 01/31/2013  . Neuropathy   . Sleep apnea 09-12-10   Uses CPAP  . 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 COLONOSCOPY  03/17/2011   Procedure: COLONOSCOPY;  Surgeon: Jeryl Columbia, MD;  Location: [facility] ENDOSCOPY;  Service: Endoscopy;  Laterality: N/A;  . COLONOSCOPY WITH PROPOFOL N/A 10/07/2016   Procedure: COLONOSCOPY WITH PROPOFOL;  Surgeon: Manya Silvas, MD;  Location: [facility] ENDOSCOPY;  Service: Endoscopy;  Laterality: N/A;  . ESOPHAGOGASTRODUODENOSCOPY (EGD) WITH PROPOFOL N/A 10/07/2016   Procedure: ESOPHAGOGASTRODUODENOSCOPY (EGD) WITH PROPOFOL;  Surgeon: Gaylyn Cheers  T, MD;  Location: [facility] ENDOSCOPY;  Service: Endoscopy;  Laterality: N/A;  . HIP FRACTURE SURGERY  January 2013   R hip  . HIP FRACTURE SURGERY Right 02/2016  . NASAL FRACTURE SURGERY      Prior to Admission medications   Medication Sig Start Date End Date Taking? Authorizing Provider  acetaminophen (TYLENOL) 500 MG tablet Take 500 mg by mouth every 6 (six) hours as needed.   Yes [provider]  pantoprazole (PROTONIX) 40 MG tablet Take 40 mg by mouth daily.  01/20/17  Yes [provider]  PARoxetine (PAXIL) 40 MG tablet Take 40 mg by mouth every morning.   Yes [provider]    Family History   Problem Relation Age of Onset  . Prostate cancer Neg Hx   . Bladder Cancer Neg Hx   . Kidney cancer Neg Hx      Social History   Tobacco Use  . Smoking status: Former Smoker    Last attempt to quit: 04/14/2007    Years since quitting: 10.7  . Smokeless tobacco: Never Used  Substance Use Topics  . Alcohol use: No  . Drug use: No    Allergies as of 01/20/2018 - Review Complete 01/20/2018  Allergen Reaction Noted  . Ibrutinib Nausea Only 08/21/2014    Review of Systems:    All systems reviewed and negative except where noted in HPI.   Physical Exam:  BP 118/75   Pulse 89   Ht 5\' 11"  (1.803 m)   Wt 152 lb 3.2 oz (69 kg)   BMI 21.23 kg/m  No LMP for male patient. Psych:  Alert and cooperative. Normal mood and affect. General:   Alert,  Well-developed, well-nourished, pleasant and cooperative in NAD Head:  Normocephalic and atraumatic. Eyes:  Sclera clear, no icterus.   Conjunctiva pink. Ears:  Normal auditory acuity. Nose:  No deformity, discharge, or lesions. Mouth:  No deformity or lesions,oropharynx pink & moist. Neck:  Supple; no masses or thyromegaly. Lungs:  Respirations even and unlabored.  Clear throughout to auscultation.   No wheezes, crackles, or rhonchi. No acute distress. Heart:  Regular rate and rhythm; no murmurs, clicks, rubs, or gallops. Abdomen:  Normal bowel sounds.  No bruits.  Soft, mild generalized tenderness and non-distended without masses, hepatosplenomegaly or hernias noted.  No guarding or rebound tenderness.    Neurologic:  Alert and oriented x3;  grossly normal neurologically. Skin:  Intact without significant lesions or rashes. No jaundice. Lymph Nodes:  No significant cervical adenopathy. Psych:  Alert and cooperative. Normal mood and affect.  Imaging Studies: Ct Chest W Contrast  Result Date: 01/13/2018 CLINICAL DATA:  Unintentional weight loss. Nonlocalized abdominal pain common nausea and night sweats. History of Waldenstrom's  macroglobulinemia. EXAM: CT CHEST, ABDOMEN, AND PELVIS WITH CONTRAST TECHNIQUE: Multidetector CT imaging of the chest, abdomen and pelvis was performed following the standard protocol during bolus administration of intravenous contrast. CONTRAST:  62mL ISOVUE-300 IOPAMIDOL (ISOVUE-300) INJECTION 61% COMPARISON:  CT scan 12/03/2016 FINDINGS: CT CHEST FINDINGS Cardiovascular: The heart is normal in size. No pericardial effusion. The aorta is normal in caliber. No dissection. The branch vessels are normal. Scattered coronary artery calcifications. Mediastinum/Nodes: No mediastinal or hilar mass or lymphadenopathy. Small scattered lymph nodes are less than 8 mm. The esophagus is grossly normal. Lungs/Pleura: Overall stable dense apical scarring changes. No worrisome pulmonary lesions or acute pulmonary findings. The tracheobronchial tree is unremarkable. No endobronchial lesions are identified. No pleural nodules or pleural effusion. Musculoskeletal:  No chest wall mass, supraclavicular or axillary adenopathy. The thyroid gland appears normal. The bony thorax is intact.  No bone lesions or fracture. CT ABDOMEN PELVIS FINDINGS Hepatobiliary: No focal hepatic lesions or intrahepatic biliary dilatation. The liver contour is slightly irregular but I do not see any other definite findings for cirrhosis. The gallbladder is unremarkable. No common bile duct dilatation. Pancreas: No mass, inflammation or ductal dilatation. Spleen: Normal size.  No focal lesions. Adrenals/Urinary Tract: The adrenal glands and kidneys are unremarkable. The bladder is normal. Stomach/Bowel: The stomach, duodenum, small bowel and colon are grossly normal. No acute inflammatory changes, mass lesions or obstructive findings. The terminal ileum and appendix are normal. Moderate sigmoid colon diverticulosis without definite findings for acute diverticulitis. Vascular/Lymphatic: Moderate distal aortic and iliac artery calcifications but no aneurysm or  dissection. The branch vessels are patent. The major venous structures are patent. No mesenteric or retroperitoneal mass or adenopathy. Small scattered lymph nodes are stable. Reproductive: The prostate gland and seminal vesicles are grossly normal. There is moderate artifact through the lower pelvis due to a right hip prosthesis. Other: No pelvic mass or adenopathy. No free pelvic fluid collections. No inguinal mass or adenopathy. No abdominal wall hernia or subcutaneous lesions. Musculoskeletal: No significant bony findings. Stable scoliosis. No worrisome bone lesions. IMPRESSION: 1. No significant abdominal/pelvic findings. No acute inflammatory changes, mass lesions or adenopathy. 2. Dense biapical pleural and parenchymal scarring changes but no worrisome lung lesions or acute pulmonary findings. 3. No mediastinal or hilar mass or adenopathy. 4. Sigmoid diverticulosis without findings for acute diverticulitis. Electronically Signed   By: Marijo Sanes M.D.   On: 01/13/2018 14:09   Ct Abdomen Pelvis W Contrast  Result Date: 01/13/2018 CLINICAL DATA:  Unintentional weight loss. Nonlocalized abdominal pain common nausea and night sweats. History of Waldenstrom's macroglobulinemia. EXAM: CT CHEST, ABDOMEN, AND PELVIS WITH CONTRAST TECHNIQUE: Multidetector CT imaging of the chest, abdomen and pelvis was performed following the standard protocol during bolus administration of intravenous contrast. CONTRAST:  24mL ISOVUE-300 IOPAMIDOL (ISOVUE-300) INJECTION 61% COMPARISON:  CT scan 12/03/2016 FINDINGS: CT CHEST FINDINGS Cardiovascular: The heart is normal in size. No pericardial effusion. The aorta is normal in caliber. No dissection. The branch vessels are normal. Scattered coronary artery calcifications. Mediastinum/Nodes: No mediastinal or hilar mass or lymphadenopathy. Small scattered lymph nodes are less than 8 mm. The esophagus is grossly normal. Lungs/Pleura: Overall stable dense apical scarring changes. No  worrisome pulmonary lesions or acute pulmonary findings. The tracheobronchial tree is unremarkable. No endobronchial lesions are identified. No pleural nodules or pleural effusion. Musculoskeletal: No chest wall mass, supraclavicular or axillary adenopathy. The thyroid gland appears normal. The bony thorax is intact.  No bone lesions or fracture. CT ABDOMEN PELVIS FINDINGS Hepatobiliary: No focal hepatic lesions or intrahepatic biliary dilatation. The liver contour is slightly irregular but I do not see any other definite findings for cirrhosis. The gallbladder is unremarkable. No common bile duct dilatation. Pancreas: No mass, inflammation or ductal dilatation. Spleen: Normal size.  No focal lesions. Adrenals/Urinary Tract: The adrenal glands and kidneys are unremarkable. The bladder is normal. Stomach/Bowel: The stomach, duodenum, small bowel and colon are grossly normal. No acute inflammatory changes, mass lesions or obstructive findings. The terminal ileum and appendix are normal. Moderate sigmoid colon diverticulosis without definite findings for acute diverticulitis. Vascular/Lymphatic: Moderate distal aortic and iliac artery calcifications but no aneurysm or dissection. The branch vessels are patent. The major venous structures are patent. No mesenteric or retroperitoneal mass or adenopathy.  Small scattered lymph nodes are stable. Reproductive: The prostate gland and seminal vesicles are grossly normal. There is moderate artifact through the lower pelvis due to a right hip prosthesis. Other: No pelvic mass or adenopathy. No free pelvic fluid collections. No inguinal mass or adenopathy. No abdominal wall hernia or subcutaneous lesions. Musculoskeletal: No significant bony findings. Stable scoliosis. No worrisome bone lesions. IMPRESSION: 1. No significant abdominal/pelvic findings. No acute inflammatory changes, mass lesions or adenopathy. 2. Dense biapical pleural and parenchymal scarring changes but no  worrisome lung lesions or acute pulmonary findings. 3. No mediastinal or hilar mass or adenopathy. 4. Sigmoid diverticulosis without findings for acute diverticulitis. Electronically Signed   By: Marijo Sanes M.D.   On: 01/13/2018 14:09    Assessment and Plan:   [name] is a 67 y.o. y/o male has been referred for transfer of GI care from [place] . Main issues is chronic abdominal pain , bloating and I noticed he has gastric intestinal metaplasia on his last EGD.   The pain could be related to his gall bladder vs from gas due to bacterial overgrowth syndrome or from H pylori/intestinal metaplasia.    Plan  1. EGD+gastric mapping to check for gastric dysplasia next week  2. H pylori breath test today  3. HIDA scan  4. LOW FODMAP diet  5. IF above are not helpful - trial of xifaxan for SIBO  I have discussed alternative options, risks & benefits,  which include, but are not limited to, bleeding, infection, perforation,respiratory complication & drug reaction.  The patient agrees with this plan & written consent will be obtained.     Follow up in 3 weeks   Dr Jonathon Bellows MD,MRCP(U.K)

## 2018-01-21 ENCOUNTER — Encounter: Payer: Self-pay | Admitting: Emergency Medicine

## 2018-01-21 LAB — CELIAC DISEASE PANEL
ENDOMYSIAL IGA: NEGATIVE
IGA/IMMUNOGLOBULIN A, SERUM: 321 mg/dL (ref 61–437)

## 2018-01-21 LAB — H. PYLORI BREATH TEST: H PYLORI BREATH TEST: NEGATIVE

## 2018-01-24 ENCOUNTER — Encounter
Admission: RE | Disposition: A | Discharge status: Home / Self Care | Payer: Self-pay | Source: Ambulatory Visit | Attending: Gastroenterology

## 2018-01-24 ENCOUNTER — Encounter: Payer: Self-pay | Admitting: Anesthesiology

## 2018-01-24 ENCOUNTER — Ambulatory Visit: Payer: Medicare Other | Admitting: Anesthesiology

## 2018-01-24 ENCOUNTER — Ambulatory Visit
Admission: RE | Admit: 2018-01-24 | Discharge: 2018-01-24 | Disposition: A | Discharge status: Home / Self Care | Payer: Medicare Other | Source: Ambulatory Visit | Attending: Gastroenterology | Admitting: Gastroenterology

## 2018-01-24 DIAGNOSIS — K31A0 Gastric intestinal metaplasia, unspecified: Secondary | ICD-10-CM

## 2018-01-24 DIAGNOSIS — K269 Duodenal ulcer, unspecified as acute or chronic, without hemorrhage or perforation: Secondary | ICD-10-CM | POA: Diagnosis not present

## 2018-01-24 DIAGNOSIS — R140 Abdominal distension (gaseous): Secondary | ICD-10-CM

## 2018-01-24 DIAGNOSIS — K3189 Other diseases of stomach and duodenum: Secondary | ICD-10-CM | POA: Diagnosis not present

## 2018-01-24 DIAGNOSIS — K2970 Gastritis, unspecified, without bleeding: Secondary | ICD-10-CM | POA: Insufficient documentation

## 2018-01-24 DIAGNOSIS — K219 Gastro-esophageal reflux disease without esophagitis: Secondary | ICD-10-CM | POA: Diagnosis not present

## 2018-01-24 DIAGNOSIS — Z8579 Personal history of other malignant neoplasms of lymphoid, hematopoietic and related tissues: Secondary | ICD-10-CM | POA: Insufficient documentation

## 2018-01-24 DIAGNOSIS — R1084 Generalized abdominal pain: Secondary | ICD-10-CM

## 2018-01-24 DIAGNOSIS — F419 Anxiety disorder, unspecified: Secondary | ICD-10-CM | POA: Insufficient documentation

## 2018-01-24 DIAGNOSIS — Z79899 Other long term (current) drug therapy: Secondary | ICD-10-CM | POA: Insufficient documentation

## 2018-01-24 DIAGNOSIS — G4730 Sleep apnea, unspecified: Secondary | ICD-10-CM | POA: Insufficient documentation

## 2018-01-24 DIAGNOSIS — F329 Major depressive disorder, single episode, unspecified: Secondary | ICD-10-CM | POA: Diagnosis not present

## 2018-01-24 DIAGNOSIS — Z87891 Personal history of nicotine dependence: Secondary | ICD-10-CM | POA: Insufficient documentation

## 2018-01-24 DIAGNOSIS — Z09 Encounter for follow-up examination after completed treatment for conditions other than malignant neoplasm: Secondary | ICD-10-CM | POA: Insufficient documentation

## 2018-01-24 DIAGNOSIS — Z85830 Personal history of malignant neoplasm of bone: Secondary | ICD-10-CM | POA: Diagnosis not present

## 2018-01-24 DIAGNOSIS — K2980 Duodenitis without bleeding: Secondary | ICD-10-CM | POA: Diagnosis not present

## 2018-01-24 HISTORY — PX: ESOPHAGOGASTRODUODENOSCOPY (EGD) WITH PROPOFOL: SHX5813

## 2018-01-24 SURGERY — ESOPHAGOGASTRODUODENOSCOPY (EGD) WITH PROPOFOL
Anesthesia: General

## 2018-01-24 MED ORDER — LIDOCAINE HCL (CARDIAC) PF 100 MG/5ML IV SOSY
PREFILLED_SYRINGE | INTRAVENOUS | Status: DC | PRN
  Administered 2018-01-24: 80 mg via INTRAVENOUS

## 2018-01-24 MED ORDER — GLYCOPYRROLATE PF 0.2 MG/ML IJ SOSY
PREFILLED_SYRINGE | INTRAMUSCULAR | Status: DC | PRN
  Administered 2018-01-24: .2 mg via INTRAVENOUS

## 2018-01-24 MED ORDER — PROPOFOL 10 MG/ML IV BOLUS
INTRAVENOUS | Status: DC | PRN
  Administered 2018-01-24: 30 mg via INTRAVENOUS
  Administered 2018-01-24: 90 mg via INTRAVENOUS

## 2018-01-24 MED ORDER — SODIUM CHLORIDE 0.9 % IV SOLN
INTRAVENOUS | Status: DC
  Administered 2018-01-24: 1000 mL via INTRAVENOUS

## 2018-01-24 MED ORDER — PROPOFOL 500 MG/50ML IV EMUL
INTRAVENOUS | Status: DC | PRN
  Administered 2018-01-24: 160 ug/kg/min via INTRAVENOUS

## 2018-01-24 NOTE — H&P (Signed)
Jonathon Bellows, MD [address], [place], [place], [place], [address] [address], [place], [place], [place], [address] Phone: [phone]  Fax: [phone]  Primary Care Physician:  Maryland Pink, MD   Pre-Procedure History & Physical: HPI:  [name] is a 67 y.o. male is here for an endoscopy    Past Medical History:  Diagnosis Date  . Anemia    prev bone marrow bx 02/11/10  . Anxiety   . Bone cancer ([place])   . Colitis 2004  . Colon polyps   . Depression   . Dysphagia   . GERD (gastroesophageal reflux disease)   . Need for prophylactic vaccination and inoculation against influenza 01/31/2013  . Neuropathy   . Sleep apnea 09-12-10   Uses CPAP  . 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 COLONOSCOPY  03/17/2011   Procedure: COLONOSCOPY;  Surgeon: Jeryl Columbia, MD;  Location: [facility] ENDOSCOPY;  Service: Endoscopy;  Laterality: N/A;  . COLONOSCOPY WITH PROPOFOL N/A 10/07/2016   Procedure: COLONOSCOPY WITH PROPOFOL;  Surgeon: Manya Silvas, MD;  Location[facility] ENDOSCOPY;  Service: Endoscopy;  Laterality: N/A;  . ESOPHAGOGASTRODUODENOSCOPY (EGD) WITH PROPOFOL N/A 10/07/2016   Procedure: ESOPHAGOGASTRODUODENOSCOPY (EGD) WITH PROPOFOL;  Surgeon: Manya Silvas, MD;  Location: [facility] ENDOSCOPY;  Service: Endoscopy;  Laterality: N/A;  . HIP FRACTURE SURGERY  January 2013   R hip  . HIP FRACTURE SURGERY Right 02/2016  . NASAL FRACTURE SURGERY      Prior to Admission medications   Medication Sig Start Date End Date Taking? Authorizing Provider  acetaminophen (TYLENOL) 500 MG tablet Take 500 mg by mouth every 6 (six) hours as needed.   Yes [provider]  pantoprazole (PROTONIX) 40 MG tablet Take 40 mg by mouth daily.  01/20/17  Yes [provider]  PARoxetine (PAXIL) 40 MG tablet Take 40 mg by mouth every morning.   Yes [provider]    Allergies as of 01/20/2018 -  Review Complete 01/20/2018  Allergen Reaction Noted  . Ibrutinib Nausea Only 08/21/2014    Family History  Problem Relation Age of Onset  . Prostate cancer Neg Hx   . Bladder Cancer Neg Hx   . Kidney cancer Neg Hx     Social History   Socioeconomic History  . Marital status: Married    Spouse name: Not on file  . Number of children: Not on file  . Years of education: Not on file  . Highest education level: Not on file  Occupational History  . Not on file  Social Needs  . Financial resource strain: Not on file  . Food insecurity:    Worry: Not on file    Inability: Not on file  . Transportation needs:    Medical: Not on file    Non-medical: Not on file  Tobacco Use  . Smoking status: Former Smoker    Last attempt to quit: 04/14/2007    Years since quitting: 10.7  . Smokeless tobacco: Never Used  Substance and Sexual Activity  . Alcohol use: No  . Drug use: No  . Sexual activity: Not on file  Lifestyle  . Physical activity:    Days per week: Not on file    Minutes per session: Not on file  . Stress: Not on file  Relationships  . Social connections:    Talks on phone: Not on file  Gets together: Not on file    Attends religious service: Not on file    Active member of club or organization: Not on file    Attends meetings of clubs or organizations: Not on file    Relationship status: Not on file  . Intimate partner violence:    Fear of current or ex partner: Not on file    Emotionally abused: Not on file    Physically abused: Not on file    Forced sexual activity: Not on file  Other Topics Concern  . Not on file  Social History Narrative  . Not on file    Review of Systems: See HPI, otherwise negative ROS  Physical Exam: BP 132/83   Pulse 68   Temp (!) 96.5 F (35.8 C) (Tympanic)   Resp 20   Ht 5\' 11"  (1.803 m)   Wt 68 kg   SpO2 100%   BMI 20.92 kg/m  General:   Alert,  pleasant and cooperative in NAD Head:  Normocephalic and  atraumatic. Neck:  Supple; no masses or thyromegaly. Lungs:  Clear throughout to auscultation, normal respiratory effort.    Heart:  +S1, +S2, Regular rate and rhythm, No edema. Abdomen:  Soft, nontender and nondistended. Normal bowel sounds, without guarding, and without rebound.   Neurologic:  Alert and  oriented x4;  grossly normal neurologically.  Impression/Plan: [name] is here for an endoscopy  to be performed for  evaluation of gastric intestinal metaplasia    Risks, benefits, limitations, and alternatives regarding endoscopy have been reviewed with the patient.  Questions have been answered.  All parties agreeable.   Jonathon Bellows, MD  01/24/2018, 8:57 AM

## 2018-01-24 NOTE — Anesthesia Preprocedure Evaluation (Signed)
Anesthesia Evaluation  Patient identified by MRN, date of birth, ID band Patient awake    Reviewed: Allergy & Precautions, H&P , NPO status , Patient's Chart, lab work & pertinent test results  History of Anesthesia Complications Negative for: history of anesthetic complications  Airway Mallampati: II  TM Distance: <3 FB Neck ROM: full    Dental  (+) Chipped, Poor Dentition   Pulmonary neg shortness of breath, sleep apnea and Continuous Positive Airway Pressure Ventilation , former smoker,           Cardiovascular Exercise Tolerance: Good (-) angina(-) Past MI and (-) DOE negative cardio ROS       Neuro/Psych PSYCHIATRIC DISORDERS negative neurological ROS     GI/Hepatic Neg liver ROS, PUD, GERD  Medicated and Controlled,  Endo/Other  negative endocrine ROS  Renal/GU negative Renal ROS  negative genitourinary   Musculoskeletal   Abdominal   Peds  Hematology negative hematology ROS (+)   Anesthesia Other Findings Past Medical History: No date: Anemia     Comment:  prev bone marrow bx 02/11/10 No date: Anxiety No date: Bone cancer [facility]) 2004: Colitis No date: Colon polyps No date: Depression No date: Dysphagia No date: GERD (gastroesophageal reflux disease) 01/31/2013: Need for prophylactic vaccination and inoculation against  influenza No date: Neuropathy 09-12-10: Sleep apnea     Comment:  Uses CPAP No date: 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[facility])  Past Surgical History: 03/17/2011: COLONOSCOPY     Comment:  Procedure: COLONOSCOPY;  Surgeon: Jeryl Columbia, MD;                Location: [facility] ENDOSCOPY;  Service: Endoscopy;  Laterality:              N/A; 10/07/2016: COLONOSCOPY WITH PROPOFOL; N/A     Comment:  Procedure: COLONOSCOPY WITH PROPOFOL;  Surgeon: Manya Silvas, MD;  Location: [facility] ENDOSCOPY;  Service:               Endoscopy;  Laterality: N/A; 10/07/2016: ESOPHAGOGASTRODUODENOSCOPY  (EGD) WITH PROPOFOL; N/A     Comment:  Procedure: ESOPHAGOGASTRODUODENOSCOPY (EGD) WITH               PROPOFOL;  Surgeon: Manya Silvas, MD;  Location:               [facility] ENDOSCOPY;  Service: Endoscopy;  Laterality: N/A; January 2013: HIP FRACTURE SURGERY     Comment:  R hip 02/2016: HIP FRACTURE SURGERY; Right No date: NASAL FRACTURE SURGERY  BMI    Body Mass Index:  20.92 kg/m      Reproductive/Obstetrics negative OB ROS                             Anesthesia Physical Anesthesia Plan  ASA: III  Anesthesia Plan: General   Post-op Pain Management:    Induction: Intravenous  PONV Risk Score and Plan: Propofol infusion and TIVA  Airway Management Planned: Natural Airway and Nasal Cannula  Additional Equipment:   Intra-op Plan:   Post-operative Plan:   Informed Consent: I have reviewed the patients History and Physical, chart, labs and discussed the procedure including the risks, benefits and alternatives for the proposed anesthesia with the patient or authorized representative who has indicated his/her understanding and acceptance.   Dental Advisory Given  Plan Discussed with: Anesthesiologist, CRNA and Surgeon  Anesthesia Plan Comments: (Patient consented for  risks of anesthesia including but not limited to:  - adverse reactions to medications - risk of intubation if required - damage to teeth, lips or other oral mucosa - sore throat or hoarseness - Damage to heart, brain, lungs or loss of life  Patient voiced understanding.)        Anesthesia Quick Evaluation

## 2018-01-24 NOTE — Anesthesia Postprocedure Evaluation (Signed)
Anesthesia Post Note  Patient: [name]  Procedure(s) Performed: ESOPHAGOGASTRODUODENOSCOPY (EGD) WITH PROPOFOL with Gastric Mapping (N/A )  Patient location during evaluation: Endoscopy Anesthesia Type: General Level of consciousness: awake and alert Pain management: pain level controlled Vital Signs Assessment: post-procedure vital signs reviewed and stable Respiratory status: spontaneous breathing, nonlabored ventilation, respiratory function stable and patient connected to nasal cannula oxygen Cardiovascular status: blood pressure returned to baseline and stable Postop Assessment: no apparent nausea or vomiting Anesthetic complications: no     Last Vitals:  Vitals:   01/24/18 0953 01/24/18 1003  BP: 108/77 119/78  Pulse: (!) 49 70  Resp: (!) 32 19  Temp:    SpO2: 100% 100%    Last Pain:  Vitals:   01/24/18 1003  TempSrc:   PainSc: 0-No pain                 Precious Haws Debria Broecker

## 2018-01-24 NOTE — Transfer of Care (Signed)
Immediate Anesthesia Transfer of Care Note  Patient: [name]  Procedure(s) Performed: ESOPHAGOGASTRODUODENOSCOPY (EGD) WITH PROPOFOL with Gastric Mapping (N/A )  Patient Location: Endoscopy Unit  Anesthesia Type:General  Level of Consciousness: sedated  Airway & Oxygen Therapy: Patient Spontanous Breathing and Patient connected to nasal cannula oxygen  Post-op Assessment: Report given to RN and Post -op Vital signs reviewed and stable  Post vital signs: Reviewed and stable  Last Vitals:  Vitals Value Taken Time  BP    Temp    Pulse    Resp    SpO2 100 % 01/24/2018  9:33 AM    Last Pain:  Vitals:   01/24/18 0840  TempSrc: Tympanic  PainSc: 3          Complications: No apparent anesthesia complications

## 2018-01-24 NOTE — Anesthesia Post-op Follow-up Note (Signed)
Anesthesia QCDR form completed.        

## 2018-01-24 NOTE — Op Note (Signed)
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 Kary Kos, MD (Referring MD) Medicines:            Monitored Anesthesia Care Complications:        No immediate complications. Procedure:            Pre-Anesthesia Assessment:                       - Prior to the procedure, a History and Physical was                        performed, and patient medications, allergies and                        sensitivities were reviewed. The patient's tolerance of                        previous anesthesia was reviewed.                       - The risks and benefits of the procedure and the                        sedation options and risks were discussed with the                        patient. All questions were answered and informed                        consent was obtained.                       - ASA Grade Assessment: III - A patient with severe                        systemic disease.                       After obtaining informed consent, the endoscope was                        passed under direct vision. Throughout the procedure,                        the patient's blood pressure, pulse, and oxygen                        saturations were monitored continuously. The Endoscope  was introduced through the mouth, and advanced to the                        third part of duodenum. The upper GI endoscopy was                        accomplished with ease. The patient tolerated the                        procedure well. Findings:      The esophagus  was normal.      Diffuse mild inflammation characterized by congestion (edema) and       erythema was found in the entire duodenum. Biopsies were taken with a       cold forceps for histology.      Patchy mild inflammation characterized by congestion (edema) and       erythema was found in the gastric antrum. Biopsies were taken with a       cold forceps for histology.      Normal mucosa was found on the greater curvature of the stomach and on       the lesser curvature of the stomach. Biopsies were taken with a cold       forceps for histology.      Localized nodular mucosa was found at the incisura. Biopsies were taken       with a cold forceps for histology.      The cardia and gastric fundus were normal on retroflexion. Impression:           - Normal esophagus.                       - Duodenitis. Biopsied.                       - Gastritis. Biopsied.                       - Normal mucosa was found in the greater curvature and                        in the lesser curvature. Biopsied.                       - Nodular mucosa in the incisura. Biopsied. Recommendation:       - Discharge patient to home (with escort).                       - Resume previous diet.                       - Continue present medications.                       - Await pathology results.                       - Return to my office as previously scheduled. Procedure Code(s):    --- Professional ---                       [phone], Esophagogastroduodenoscopy, flexible, transoral;  with biopsy, single or multiple Diagnosis Code(s):    --- Professional ---                       K29.80, Duodenitis without bleeding                       K29.70, Gastritis, unspecified, without bleeding                       K31.89, Other diseases of stomach and duodenum CPT copyright 2018 American Medical Association. All rights reserved. The codes documented in this report are preliminary and upon coder review may   be revised to meet current compliance requirements. Jonathon Bellows, MD Jonathon Bellows MD, MD 01/24/2018 9:35:27 AM This report has been signed electronically. Number of Addenda: 0 Note Initiated On: 01/24/2018 9:05 AM      [facility]

## 2018-01-25 LAB — SURGICAL PATHOLOGY

## 2018-01-27 NOTE — Telephone Encounter (Signed)
Spoke with pt spouse, Stanton Kidney, and informed her of pt HIDA scan appointment information.

## 2018-02-08 ENCOUNTER — Ambulatory Visit
Admission: RE | Admit: 2018-02-08 | Discharge: 2018-02-08 | Disposition: A | Discharge status: Home / Self Care | Payer: Medicare Other | Source: Ambulatory Visit | Attending: Gastroenterology | Admitting: Gastroenterology

## 2018-02-08 DIAGNOSIS — R1084 Generalized abdominal pain: Secondary | ICD-10-CM | POA: Insufficient documentation

## 2018-02-08 MED ORDER — TECHNETIUM TC 99M MEBROFENIN IV KIT
5.3500 | PACK | Freq: Once | INTRAVENOUS | Status: AC | PRN
  Administered 2018-02-08: 5.35 via INTRAVENOUS

## 2018-02-17 ENCOUNTER — Encounter: Payer: Self-pay | Admitting: Gastroenterology

## 2018-02-17 ENCOUNTER — Ambulatory Visit (INDEPENDENT_AMBULATORY_CARE_PROVIDER_SITE_OTHER): Payer: Medicare Other | Admitting: Gastroenterology

## 2018-02-17 VITALS — BP 104/64 | HR 83 | Ht 71.0 in | Wt 157.0 lb

## 2018-02-17 DIAGNOSIS — R1011 Right upper quadrant pain: Secondary | ICD-10-CM

## 2018-02-17 NOTE — Progress Notes (Signed)
Jonathon Bellows MD, MRCP(U.K) [address]  [place]  [place], [place] [address]  Main: [phone]  Fax: [phone]   Primary Care Physician: Maryland Pink, MD  Primary Gastroenterologist:  Dr. Jonathon Bellows   No chief complaint on file.   HPI: [name] is a 67 y.o. male    Summary of history : He is initially referred and seen on 01/20/2018 for abdominal pain. He was recently seen by Dr Mike Gip for [facility] macroglobulinemia and expressed interest to switch over to a new GI for another evaluation.   He has previously been a patient of [facility] in the past . H/o colon polyps. H/o dysphagia , been on a PP for many years.   Last colonoscopy 09/2016 - adenomas resected, diverticulosis and internal hemorroids noted.   EGD 09/2016 showed LA grade A esophagitis .Intestinal metaplaisa of the stomach with no H pylori . MRCP 11/2017 showed no stons in CBD or no dilation although prior CT scan showed obstruction in CBD. He has been treated for diverticulitis in the past.   01/13/18 : CT abdomen shows no acute abnormality in the abdomen .    The abdominal pain began after his EGD and colonoscopy in 2018, continuous, wakes him up at night, right lower quadrant and radiates across his abdomen.  Severe in nature like a knife.  Worse with fast food and greasy foods.  Lost 11 pounds over last 3 months.  On Protonix 40 mill grams once a day.  Has his gallbladder intact.  Soft bowel movements with some recent change in bowel movements no relief with bowel movements.  Some gas and had abdominal distention.  More pain when he gets swollen up.  His gas when he passes is foul-smelling and feels better when he passes gas.  No artificial sugars in his diet.  Interval history   01/20/2018 to 02/17/2018  01/20/2018: H. pylori breath test: Negative 02/08/2018: HIDA scan: Low normal ejection fraction.  Patent cystic and common bile ducts. 01/24/2018: EGD: Form for gastric mapping due to  diagnosis of gastric intestinal metaplasia: Gastritis and duodenitis noted.  Biopsies taken as per gastric mapping protocol: Duodenitis, nonspecific chronic inflammation of the stomach, no H. pylori noted no intestinal metaplasia noted.  Gained 5 lbs since last visit  Says still has pain all over his abdomen, more the right upper  side . Takes protonix. " I can tell when I miss a dose" Normal bowel movements. Not much gas, not much bloating.  BP 104/64   Pulse 83   Ht 5\' 11"  (1.803 m)   Wt 157 lb (71.2 kg)   BMI 21.90 kg/m    Denies any NSAID use.   Current Outpatient Medications  Medication Sig Dispense Refill  . acetaminophen (TYLENOL) 500 MG tablet Take 500 mg by mouth every 6 (six) hours as needed.    . pantoprazole (PROTONIX) 40 MG tablet Take 40 mg by mouth daily.     Marland Kitchen PARoxetine (PAXIL) 40 MG tablet Take 40 mg by mouth every morning.     No current facility-administered medications for this visit.     Allergies as of 02/17/2018 - Review Complete 01/24/2018  Allergen Reaction Noted  . Ibrutinib Nausea Only 08/21/2014    ROS:  General: Negative for anorexia, weight loss, fever, chills, fatigue, weakness. ENT: Negative for hoarseness, difficulty swallowing , nasal congestion. CV: Negative for chest pain, angina, palpitations, dyspnea on exertion, peripheral edema.  Respiratory: Negative for dyspnea at rest, dyspnea on exertion,  cough, sputum, wheezing.  GI: See history of present illness. GU:  Negative for dysuria, hematuria, urinary incontinence, urinary frequency, nocturnal urination.  Endo: Negative for unusual weight change.    Physical Examination:   There were no vitals taken for this visit.  General: Well-nourished, well-developed in no acute distress.  Eyes: No icterus. Conjunctivae pink. Mouth: Oropharyngeal mucosa moist and pink , no lesions erythema or exudate. Lungs: Clear to auscultation bilaterally. Non-labored. Heart: Regular rate and rhythm, no  murmurs rubs or gallops.  Abdomen: Bowel sounds are normal, nontender, nondistended, no hepatosplenomegaly or masses, no abdominal bruits or hernia , no rebound or guarding.   Extremities: No lower extremity edema. No clubbing or deformities. Neuro: Alert and oriented x 3.  Grossly intact. Skin: Warm and dry, no jaundice.   Psych: Alert and cooperative, normal mood and affect.   Imaging Studies: Nm Hepato W/eject Fract  Result Date: 02/08/2018 CLINICAL DATA:  Pain with nausea EXAM: NUCLEAR MEDICINE HEPATOBILIARY IMAGING WITH GALLBLADDER EF VIEWS: Anterior right upper quadrant RADIOPHARMACEUTICALS:  5.35 mCi Tc-39m  Choletec IV COMPARISON:  None. FINDINGS: Liver uptake of radiotracer is unremarkable. There is prompt visualization of gallbladder and small bowel, indicating patency of the cystic and common bile ducts. The patient consumed 8 ounces of Ensure orally with calculation of the computer generated ejection fraction of radiotracer from the gallbladder. The patient did not experience clinical symptoms with the oral Ensure consumption. The computer generated ejection fraction of radiotracer from the gallbladder is low normal at 35%, normal greater than 33% using the oral agent. IMPRESSION: Low normal ejection fraction of radiotracer from the gallbladder. Patient did not experience clinical symptoms with the oral Ensure consumption. Cystic and common bile ducts are patent as is evidenced by visualization of gallbladder and small bowel. Electronically Signed   By: Lowella Grip III M.D.   On: 02/08/2018 13:22    Assessment and Plan:   [name] is a 67 y.o. y/o male  For abdominal pain,no bloating since last visit but still has pain in the RUQ. Does have some relief with PPI. Differentials are GERD+/- gall bladder dyskinesia.    Plan  1. EGD to be repeated once more in 3 years due to prior diagnosis of gastric intestinal metaplasia although none seen on last EGD on 01/24/2018 2.  Increase protonix to BID- if no better in 3-4 weeks will refer to Surgery to discuss if he would benefit from a cholecystectomy   Dr Jonathon Bellows  MD,MRCP [facility]) Follow up in 4 weeks

## 2018-03-09 ENCOUNTER — Encounter: Payer: Self-pay | Admitting: Gastroenterology

## 2018-03-09 ENCOUNTER — Other Ambulatory Visit: Payer: Self-pay

## 2018-03-09 ENCOUNTER — Ambulatory Visit (INDEPENDENT_AMBULATORY_CARE_PROVIDER_SITE_OTHER): Payer: Medicare Other | Admitting: Gastroenterology

## 2018-03-09 VITALS — BP 108/69 | HR 80 | Ht 71.0 in | Wt 156.6 lb

## 2018-03-09 DIAGNOSIS — R1011 Right upper quadrant pain: Secondary | ICD-10-CM

## 2018-03-09 NOTE — Progress Notes (Signed)
Harold Bellows MD, MRCP(U.K) [address]  [place]  [place], [place] [address]  Main: [phone]  Fax: [phone]   Primary Care Physician: Maryland Pink, MD  Primary Gastroenterologist:  Dr. Jonathon Wood   Chief Complaint  Patient presents with  . Follow-up    Abdominal Pain    HPI: [name] is a 67 y.o. male    Summary of history : He was initially referred and seen on 01/20/2018 for abdominal pain. 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   He has previously been a patient of [facility] in the past . H/o colon polyps. H/o dysphagia , been on a PP for many years. Last colonoscopy 09/2016 - adenomas resected, diverticulosis and internal hemorroids noted.   EGD 09/2016 showed LA grade A esophagitis .Intestinal metaplaisa of the stomach with no H pylori . MRCP 11/2017 showed no stones in CBD or no dilation although prior CT scan showed obstruction in CBD. He has been treated for diverticulitis in the past.   01/13/18 : CT abdomen shows no acute abnormality in the abdomen .   The abdominal pain began after his EGD and colonoscopy in 2018, continuous, wakes him up at night, right lower quadrant and radiates across his abdomen.  Severe in nature like a knife.  Worse with fast food and greasy foods.  LHas his gallbladder intact.  Soft bowel movements.  01/20/2018: H. pylori breath test: Negative 02/08/2018: HIDA scan: Low normal ejection fraction.  Patent cystic and common bile ducts. 01/24/2018: EGD: Form for gastric mapping due to diagnosis of gastric intestinal metaplasia: Gastritis and duodenitis noted.  Biopsies taken as per gastric mapping protocol: Duodenitis, nonspecific chronic inflammation of the stomach, no H. pylori noted no intestinal metaplasia noted.  Interval history   02/17/2018-03/09/18   No better after increasing dose of PPI, today the  pain sounds more biliary in nature, pain is after meals usually within 30 mins , lasts 45 mins and worse after a fatty meal.    Weight stable    Current Outpatient Medications  Medication Sig Dispense Refill  . acetaminophen (TYLENOL) 500 MG tablet Take 500 mg by mouth every 6 (six) hours as needed.    . pantoprazole (PROTONIX) 40 MG tablet Take 40 mg by mouth daily.     Marland Kitchen PARoxetine (PAXIL) 40 MG tablet Take 40 mg by mouth every morning.     No current facility-administered medications for this visit.     Allergies as of 03/09/2018 - Review Complete 03/09/2018  Allergen Reaction Noted  . Ibrutinib Nausea Only 08/21/2014    ROS:  General: Negative for anorexia, weight loss, fever, chills, fatigue, weakness. ENT: Negative for hoarseness, difficulty swallowing , nasal congestion. CV: Negative for chest pain, angina, palpitations, dyspnea on exertion, peripheral edema.  Respiratory: Negative for dyspnea at rest, dyspnea on exertion, cough, sputum, wheezing.  GI: See history of present illness. GU:  Negative for dysuria, hematuria, urinary incontinence, urinary frequency, nocturnal urination.  Endo: Negative for unusual weight change.    Physical Examination:   BP 108/69   Pulse 80   Ht 5\' 11"  (1.803 m)   Wt 156 lb 9.6 oz (71 kg)   BMI 21.84 kg/m   General: Well-nourished, well-developed in no acute distress.  Eyes: No icterus. Conjunctivae pink. Mouth: Oropharyngeal mucosa moist and pink , no lesions erythema or exudate. Lungs: Clear to auscultation bilaterally. Non-labored.  Heart: Regular rate and rhythm, no murmurs rubs or gallops.  Abdomen: Bowel sounds are normal, nontender, nondistended, no hepatosplenomegaly or masses, no abdominal bruits or hernia , no rebound or guarding.   Extremities: No lower extremity edema. No clubbing or deformities. Neuro: Alert and oriented x 3.  Grossly intact. Skin: Warm and dry, no jaundice.   Psych: Alert and cooperative, normal mood  and affect.   Imaging Studies: Nm Hepato W/eject Fract  Result Date: 02/08/2018 CLINICAL DATA:  Pain with nausea EXAM: NUCLEAR MEDICINE HEPATOBILIARY IMAGING WITH GALLBLADDER EF VIEWS: Anterior right upper quadrant RADIOPHARMACEUTICALS:  5.35 mCi Tc-28m  Choletec IV COMPARISON:  None. FINDINGS: Liver uptake of radiotracer is unremarkable. There is prompt visualization of gallbladder and small bowel, indicating patency of the cystic and common bile ducts. The patient consumed 8 ounces of Ensure orally with calculation of the computer generated ejection fraction of radiotracer from the gallbladder. The patient did not experience clinical symptoms with the oral Ensure consumption. The computer generated ejection fraction of radiotracer from the gallbladder is low normal at 35%, normal greater than 33% using the oral agent. IMPRESSION: Low normal ejection fraction of radiotracer from the gallbladder. Patient did not experience clinical symptoms with the oral Ensure consumption. Cystic and common bile ducts are patent as is evidenced by visualization of gallbladder and small bowel. Electronically Signed   By: Lowella Grip III M.D.   On: 02/08/2018 13:22    Assessment and Plan:   [name] is a 67 y.o. y/o male  here to follow up for abdominal pain RUQ. I maximized treatment for GERD, still has RUQ pain after meals, worse with fatty food and lasting 45 mins: sounds like biliary colic. With a HIDA scan that shows a low EF- suggests ?biliary dyskinesia   Plan  1. EGD to be repeated once more in 3 years due to prior diagnosis of gastric intestinal metaplasia although none seen on last EGD on 01/24/2018 2.Refer for cholecystectomy to Dr Pabon/Dr Hampton Abbot    Dr Harold Bellows  MD,MRCP [facility]) Follow up in 8 weeks

## 2018-03-16 ENCOUNTER — Encounter: Payer: Self-pay | Admitting: *Deleted

## 2018-03-16 ENCOUNTER — Encounter: Payer: Self-pay | Admitting: Surgery

## 2018-03-16 ENCOUNTER — Ambulatory Visit (INDEPENDENT_AMBULATORY_CARE_PROVIDER_SITE_OTHER): Payer: Medicare Other | Admitting: Surgery

## 2018-03-16 ENCOUNTER — Other Ambulatory Visit: Payer: Self-pay

## 2018-03-16 VITALS — BP 142/82 | HR 72 | Temp 97.7°F | Ht 71.0 in | Wt 154.0 lb

## 2018-03-16 DIAGNOSIS — K828 Other specified diseases of gallbladder: Secondary | ICD-10-CM

## 2018-03-16 MED ORDER — CEFAZOLIN SODIUM-DEXTROSE 2-4 GM/100ML-% IV SOLN
2.0000 g | INTRAVENOUS | Status: AC
  Administered 2018-03-17: 2 g via INTRAVENOUS

## 2018-03-16 MED ORDER — INDOCYANINE GREEN 25 MG IV SOLR
7.5000 mg | Freq: Once | INTRAVENOUS | Status: AC
  Administered 2018-03-17: 7.5 mg via INTRAVENOUS
  Filled 2018-03-16 (×2): qty 25

## 2018-03-16 NOTE — Progress Notes (Signed)
Patient's surgery has been scheduled for 03-17-18 at [facility] with Dr. Dahlia Byes.  The patient is aware to check in at Admitting tomorrow at 8:45 am.   He is aware to be NPO after midnight and have a driver.   The patient is aware to call the office should he have further questions.

## 2018-03-16 NOTE — Progress Notes (Signed)
Patient ID: [name], male   DOB: 1950/09/15, 67 y.o.   MRN: [id number]  HPI [name] is a 67 y.o. male rotation of the request of Dr. Vicente Wood for abdominal pain.  He reports that he has been having right upper quadrant pain for the last 7 months.  Pain is intermittent moderate to severe in nature and sharp.  The pain is exacerbated with fatty meals.  There is no chills no weight loss.  Has had extensive work-up by Dr. Vicente Wood including a CT scan of the abdomen and pelvis that I have personally review showing no acute major abnormalities.  Have a MRCP few months ago showing no evidence of choledocholithiasis or cirrhosis.  Recently HIDA scan was performed showing a decreased ejection fraction.  Patient reports that he did have nausea and pain when he they did the HIDA scan. 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  His CMP and CBC were completely normal. Able to perform more than 4 METS of activity without any shortness of breath or chest pain  HPI  Past Medical History:  Diagnosis Date  . Anemia    prev bone marrow bx 02/11/10  . Anxiety   . Bone cancer ([place])   . Colitis 2004  . Colon polyps   . Depression   . Dysphagia   . GERD (gastroesophageal reflux disease)   . Need for prophylactic vaccination and inoculation against influenza 01/31/2013  . Neuropathy   . Sleep apnea 09-12-10   Uses CPAP  . 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[facility])     Past Surgical History:  Procedure Laterality Date  . COLONOSCOPY  03/17/2011   Procedure: COLONOSCOPY;  Surgeon: Jeryl Columbia, MD;  Location: [facility] ENDOSCOPY;  Service: Endoscopy;  Laterality: N/A;  . COLONOSCOPY WITH PROPOFOL N/A 10/07/2016   Procedure: COLONOSCOPY WITH PROPOFOL;  Surgeon: Manya Silvas, MD;  Location[facility] ENDOSCOPY;  Service: Endoscopy;  Laterality: N/A;  . ESOPHAGOGASTRODUODENOSCOPY (EGD) WITH PROPOFOL N/A 10/07/2016   Procedure: ESOPHAGOGASTRODUODENOSCOPY (EGD) WITH PROPOFOL;  Surgeon:  Manya Silvas, MD;  Location: [facility] ENDOSCOPY;  Service: Endoscopy;  Laterality: N/A;  . ESOPHAGOGASTRODUODENOSCOPY (EGD) WITH PROPOFOL N/A 01/24/2018   Procedure: ESOPHAGOGASTRODUODENOSCOPY (EGD) WITH PROPOFOL with Gastric Mapping;  Surgeon: Jonathon Bellows, MD;  Location: [facility] ENDOSCOPY;  Service: Gastroenterology;  Laterality: N/A;  . HIP FRACTURE SURGERY  January 2013   R hip  . HIP FRACTURE SURGERY Right 02/2016  . NASAL FRACTURE SURGERY      Family History  Problem Relation Age of Onset  . Prostate cancer Neg Hx   . Bladder Cancer Neg Hx   . Kidney cancer Neg Hx     Social History Social History   Tobacco Use  . Smoking status: Former Smoker    Last attempt to quit: 04/14/2007    Years since quitting: 10.9  . Smokeless tobacco: Never Used  Substance Use Topics  . Alcohol use: No  . Drug use: No    Allergies  Allergen Reactions  . Ibrutinib Nausea Only    Current Outpatient Medications  Medication Sig Dispense Refill  . acetaminophen (TYLENOL) 500 MG tablet Take 500 mg by mouth every 6 (six) hours as needed.    . pantoprazole (PROTONIX) 40 MG tablet Take 40 mg by mouth daily.     Marland Kitchen PARoxetine (PAXIL) 40 MG tablet Take 40 mg by mouth every morning.     No current facility-administered medications for this visit.      Review of Systems Full ROS  was asked and was negative except for the information on the HPI  Physical Exam Blood pressure (!) 142/82, pulse 72, temperature 97.7 F (36.5 C), temperature source Skin, height 5\' 11"  (1.803 m), weight 154 lb (69.9 kg), SpO2 98 %. CONSTITUTIONAL: NAD EYES: Pupils are equal, round, and reactive to light, Sclera are non-icteric. EARS, NOSE, MOUTH AND THROAT: The oropharynx is clear. The oral mucosa is pink and moist. Hearing is intact to voice. LYMPH NODES:  Lymph nodes in the neck are normal. RESPIRATORY:  Lungs are clear. There is normal respiratory effort, with equal breath sounds bilaterally, and without pathologic  use of accessory muscles. CARDIOVASCULAR: Heart is regular without murmurs, gallops, or rubs. CHest: tender on lower chest wall GI: The abdomen is  soft, mildly tender to palpation on RUQ and periumbilical area. No Murphy, no peritonitis There are no palpable masses. There is no hepatosplenomegaly. There are normal bowel sounds in all quadrants. GU: Rectal deferred.   MUSCULOSKELETAL: Normal muscle strength and tone. No cyanosis or edema.   SKIN: Turgor is good and there are no pathologic skin lesions or ulcers. NEUROLOGIC: Motor and sensation is grossly normal. Cranial nerves are grossly intact. PSYCH:  Oriented to person, place and time. Affect is normal.  Data Reviewed  I have personally reviewed the patient's imaging, laboratory findings and medical records.    Assessment/Plan This is 67 year old male with right upper quadrant pain And chest wall pain.  Differential diagnoses include biliary dyskinesia that is supported by the HIDA scan other diagnosis will include intercostal neuralgia or some functional GI issues.  Given that he has had extensive work-up I have a lengthy discussion with the patient and 1 of the options that I gave to the patient was to do a cholecystectomy for both diagnostic and therapeutic options.  Discussed with him in detail that there is a chance that his symptoms may not resolve completely despite cholecystectomy.  He is in agreement and he wishes to proceed.  The risks, benefits, complications, treatment options, and expected outcomes were discussed with the patient. The possibilities of bleeding, recurrent infection, finding a normal gallbladder, perforation of viscus organs, damage to surrounding structures, bile leak, abscess formation, needing a drain placed, the need for additional procedures, reaction to medication, pulmonary aspiration,  failure to diagnose a condition, the possible need to convert to an open procedure, and creating a complication requiring  transfusion or operation were discussed with the patient. The patient and/or family concurred with the proposed plan, giving informed consent.   Caroleen Hamman, MD, FACS     Caroleen Hamman, MD FACS General Surgeon 03/16/2018, 1:46 PM

## 2018-03-16 NOTE — Progress Notes (Signed)
Got it.

## 2018-03-16 NOTE — Patient Instructions (Signed)
Cholecystostomy Cholecystostomy is a procedure to drain fluid from the gallbladder by using a flexible tube (catheter). The gallbladder is a pear-shaped organ that lies beneath the liver on the right side of the body. The gallbladder stores bile, which is a fluid that helps the body to digest fats. You may have this procedure:  If your gallbladder is swollen or irritated due to bile build up.  To prepare you for gallbladder surgery.  To control your symptoms if you cannot have gallbladder surgery.  Tell a health care provider about:  Any allergies you have.  All medicines you are taking, including vitamins, herbs, eye drops, creams, and over-the-counter medicines.  Any problems you or family members have had with anesthetic medicines.  Any blood disorders you have.  Any surgeries you have had.  Any medical conditions you have.  Whether you are pregnant or may be pregnant. What are the risks? Generally, this is a safe procedure. However, problems may occur, including:  The catheter moving out of place.  Clogging of the catheter.  Infection of the incision site.  Internal bleeding.  Puncture of the gallbladder. This can cause the bile to leak.  Infection inside the abdomen (peritonitis).  Damage to other structures or organs.  Low blood pressure and slowed heart rate.  Allergic reactions to medicines or dyes.  What happens before the procedure?  You may need to have tests, including: ? Imaging studies of your gallbladder. ? Blood tests.  Follow instructions from your health care provider about eating or drinking restrictions.  Do not use tobacco products, including cigarettes, chewing tobacco, or e-cigarettes, as told by your health care provider. If you need help quitting, ask your health care provider.  Ask your health care provider about: ? Changing or stopping your regular medicines. This is especially important if you are taking diabetes medicines or blood  thinners. ? Taking medicines such as aspirin and ibuprofen. These medicines can thin your blood. Do not take these medicines before your procedure if your health care provider instructs you not to.  Plan to have someone take you home after the procedure.  If you go home right after the procedure, plan to have someone with you for 24 hours.  Ask your health care provider how your surgical site will be marked or identified.  You may be given antibiotic medicine to help prevent infection. What happens during the procedure?  To reduce your risk of infection: ? Your health care team will wash or sanitize their hands. ? Your skin will be washed with soap.  An IV tube will be inserted into one of your veins.  You will be given one or more of the following: ? A medicine to help you relax (sedative). ? A medicine to numb the area (local anesthetic).  A small incision will be made in your abdomen.  A long needle or a wide puncturing tool (trocar) will be put through the incision.  Your health care provider will use an imaging study (ultrasound) to guide the needle or trocar into your gallbladder.  After the needle or trocar is in your gallbladder, a small amount of dye may be injected. An X-ray may be taken to make sure that the needle or trocar is in the correct place.  A catheter will be placed through the needle or trocar.  The catheter will be secured to your skin with stitches (sutures).  The catheter will be connected to a drainage bag. Fluid will drain from the gallbladder into  the bag. Some of this bile may be sent to the lab to be examined.  A bandage (dressing) will be placed over the incision. The procedure may vary among health care providers and hospitals. What happens after the procedure?  Your blood pressure, heart rate, breathing rate, and blood oxygen level will be monitored often until the medicines you were given have worn off.  Dye may be injected through your  catheter to check the catheter and your gallbladder.  Your gallbladder may be flushed out (irrigated) through the catheter.  Your catheter and drainage bag may need to stay in place for several weeks or as told by your health care provider. This information is not intended to replace advice given to you by your health care provider. Make sure you discuss any questions you have with your health care provider. Document Released: 06/26/2008 Document Revised: 09/05/2015 Document Reviewed: 07/11/2014 Elsevier Interactive Patient Education  2018 [employer].

## 2018-03-16 NOTE — H&P (View-Only) (Signed)
Patient ID: [name], male   DOB: [birth year], 67 y.o.   MRN: [id number]  HPI [name] is a 67 y.o. male rotation of the request of Dr. Vicente Males for abdominal pain.  He reports that he has been having right upper quadrant pain for the last 7 months.  Pain is intermittent moderate to severe in nature and sharp.  The pain is exacerbated with fatty meals.  There is no chills no weight loss.  Has had extensive work-up by Dr. Vicente Males including a CT scan of the abdomen and pelvis that I have personally review showing no acute major abnormalities.  Have a MRCP few months ago showing no evidence of choledocholithiasis or cirrhosis.  Recently HIDA scan was performed showing a decreased ejection fraction.  Patient reports that he did have nausea and pain when he they did the HIDA scan. 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  His CMP and CBC were completely normal. Able to perform more than 4 METS of activity without any shortness of breath or chest pain  HPI  Past Medical History:  Diagnosis Date  . Anemia    prev bone marrow bx 02/11/10  . Anxiety   . Bone cancer ([place])   . Colitis 2004  . Colon polyps   . Depression   . Dysphagia   . GERD (gastroesophageal reflux disease)   . Need for prophylactic vaccination and inoculation against influenza 01/31/2013  . Neuropathy   . Sleep apnea 09-12-10   Uses CPAP  . 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[facility])     Past Surgical History:  Procedure Laterality Date  . COLONOSCOPY  03/17/2011   Procedure: COLONOSCOPY;  Surgeon: Jeryl Columbia, MD;  Location: [facility] ENDOSCOPY;  Service: Endoscopy;  Laterality: N/A;  . COLONOSCOPY WITH PROPOFOL N/A 10/07/2016   Procedure: COLONOSCOPY WITH PROPOFOL;  Surgeon: Manya Silvas, MD;  Location: [facility] ENDOSCOPY;  Service: Endoscopy;  Laterality: N/A;  . ESOPHAGOGASTRODUODENOSCOPY (EGD) WITH PROPOFOL N/A 10/07/2016   Procedure: ESOPHAGOGASTRODUODENOSCOPY (EGD) WITH PROPOFOL;  Surgeon:  Manya Silvas, MD;  Location: [facility] ENDOSCOPY;  Service: Endoscopy;  Laterality: N/A;  . ESOPHAGOGASTRODUODENOSCOPY (EGD) WITH PROPOFOL N/A 01/24/2018   Procedure: ESOPHAGOGASTRODUODENOSCOPY (EGD) WITH PROPOFOL with Gastric Mapping;  Surgeon: Jonathon Bellows, MD;  Location: [facility] ENDOSCOPY;  Service: Gastroenterology;  Laterality: N/A;  . HIP FRACTURE SURGERY  January 2013   R hip  . HIP FRACTURE SURGERY Right 02/2016  . NASAL FRACTURE SURGERY      Family History  Problem Relation Age of Onset  . Prostate cancer Neg Hx   . Bladder Cancer Neg Hx   . Kidney cancer Neg Hx     Social History Social History   Tobacco Use  . Smoking status: Former Smoker    Last attempt to quit: 04/14/2007    Years since quitting: 10.9  . Smokeless tobacco: Never Used  Substance Use Topics  . Alcohol use: No  . Drug use: No    Allergies  Allergen Reactions  . Ibrutinib Nausea Only    Current Outpatient Medications  Medication Sig Dispense Refill  . acetaminophen (TYLENOL) 500 MG tablet Take 500 mg by mouth every 6 (six) hours as needed.    . pantoprazole (PROTONIX) 40 MG tablet Take 40 mg by mouth daily.     Marland Kitchen PARoxetine (PAXIL) 40 MG tablet Take 40 mg by mouth every morning.     No current facility-administered medications for this visit.      Review of Systems Full ROS  was asked and was negative except for the information on the HPI  Physical Exam Blood pressure (!) 142/82, pulse 72, temperature 97.7 F (36.5 C), temperature source Skin, height 5\' 11"  (1.803 m), weight 154 lb (69.9 kg), SpO2 98 %. CONSTITUTIONAL: NAD EYES: Pupils are equal, round, and reactive to light, Sclera are non-icteric. EARS, NOSE, MOUTH AND THROAT: The oropharynx is clear. The oral mucosa is pink and moist. Hearing is intact to voice. LYMPH NODES:  Lymph nodes in the neck are normal. RESPIRATORY:  Lungs are clear. There is normal respiratory effort, with equal breath sounds bilaterally, and without pathologic  use of accessory muscles. CARDIOVASCULAR: Heart is regular without murmurs, gallops, or rubs. CHest: tender on lower chest wall GI: The abdomen is  soft, mildly tender to palpation on RUQ and periumbilical area. No Murphy, no peritonitis There are no palpable masses. There is no hepatosplenomegaly. There are normal bowel sounds in all quadrants. GU: Rectal deferred.   MUSCULOSKELETAL: Normal muscle strength and tone. No cyanosis or edema.   SKIN: Turgor is good and there are no pathologic skin lesions or ulcers. NEUROLOGIC: Motor and sensation is grossly normal. Cranial nerves are grossly intact. PSYCH:  Oriented to person, place and time. Affect is normal.  Data Reviewed  I have personally reviewed the patient's imaging, laboratory findings and medical records.    Assessment/Plan This is 67 year old male with right upper quadrant pain And chest wall pain.  Differential diagnoses include biliary dyskinesia that is supported by the HIDA scan other diagnosis will include intercostal neuralgia or some functional GI issues.  Given that he has had extensive work-up I have a lengthy discussion with the patient and 1 of the options that I gave to the patient was to do a cholecystectomy for both diagnostic and therapeutic options.  Discussed with him in detail that there is a chance that his symptoms may not resolve completely despite cholecystectomy.  He is in agreement and he wishes to proceed.  The risks, benefits, complications, treatment options, and expected outcomes were discussed with the patient. The possibilities of bleeding, recurrent infection, finding a normal gallbladder, perforation of viscus organs, damage to surrounding structures, bile leak, abscess formation, needing a drain placed, the need for additional procedures, reaction to medication, pulmonary aspiration,  failure to diagnose a condition, the possible need to convert to an open procedure, and creating a complication requiring  transfusion or operation were discussed with the patient. The patient and/or family concurred with the proposed plan, giving informed consent.   Caroleen Hamman, MD, FACS     Caroleen Hamman, MD FACS General Surgeon 03/16/2018, 1:46 PM

## 2018-03-17 ENCOUNTER — Ambulatory Visit: Payer: Medicare Other | Admitting: Anesthesiology

## 2018-03-17 ENCOUNTER — Ambulatory Visit
Admission: RE | Admit: 2018-03-17 | Discharge: 2018-03-17 | Disposition: A | Discharge status: Home / Self Care | Payer: Medicare Other | Attending: Surgery | Admitting: Surgery

## 2018-03-17 ENCOUNTER — Encounter
Admission: RE | Disposition: A | Discharge status: Home / Self Care | Payer: Self-pay | Source: Home / Self Care | Attending: Surgery

## 2018-03-17 ENCOUNTER — Encounter: Payer: Self-pay | Admitting: Anesthesiology

## 2018-03-17 DIAGNOSIS — F329 Major depressive disorder, single episode, unspecified: Secondary | ICD-10-CM | POA: Diagnosis not present

## 2018-03-17 DIAGNOSIS — Z85830 Personal history of malignant neoplasm of bone: Secondary | ICD-10-CM | POA: Diagnosis not present

## 2018-03-17 DIAGNOSIS — Z9989 Dependence on other enabling machines and devices: Secondary | ICD-10-CM | POA: Insufficient documentation

## 2018-03-17 DIAGNOSIS — G4730 Sleep apnea, unspecified: Secondary | ICD-10-CM | POA: Diagnosis not present

## 2018-03-17 DIAGNOSIS — K811 Chronic cholecystitis: Secondary | ICD-10-CM | POA: Insufficient documentation

## 2018-03-17 DIAGNOSIS — Z87891 Personal history of nicotine dependence: Secondary | ICD-10-CM | POA: Diagnosis not present

## 2018-03-17 DIAGNOSIS — F419 Anxiety disorder, unspecified: Secondary | ICD-10-CM | POA: Insufficient documentation

## 2018-03-17 DIAGNOSIS — Z79899 Other long term (current) drug therapy: Secondary | ICD-10-CM | POA: Insufficient documentation

## 2018-03-17 DIAGNOSIS — R109 Unspecified abdominal pain: Secondary | ICD-10-CM | POA: Diagnosis present

## 2018-03-17 DIAGNOSIS — K828 Other specified diseases of gallbladder: Secondary | ICD-10-CM | POA: Diagnosis not present

## 2018-03-17 DIAGNOSIS — K219 Gastro-esophageal reflux disease without esophagitis: Secondary | ICD-10-CM | POA: Diagnosis not present

## 2018-03-17 HISTORY — PX: ROBOTIC ASSISTED LAPAROSCOPIC CHOLECYSTECTOMY-MULTI SITE: SHX6603

## 2018-03-17 SURGERY — ROBOTIC ASSISTED LAPAROSCOPIC CHOLECYSTECTOMY-MULTI SITE
Anesthesia: General

## 2018-03-17 MED ORDER — GABAPENTIN 300 MG PO CAPS
ORAL_CAPSULE | ORAL | Status: AC
  Administered 2018-03-17: 300 mg via ORAL
  Filled 2018-03-17: qty 1

## 2018-03-17 MED ORDER — SEVOFLURANE IN SOLN
RESPIRATORY_TRACT | Status: AC
  Filled 2018-03-17: qty 250

## 2018-03-17 MED ORDER — SODIUM CHLORIDE 0.9 % IV SOLN: INTRAVENOUS | Status: DC | PRN

## 2018-03-17 MED ORDER — FENTANYL CITRATE (PF) 100 MCG/2ML IJ SOLN
INTRAMUSCULAR | Status: DC | PRN
  Administered 2018-03-17 (×2): 25 ug via INTRAVENOUS
  Administered 2018-03-17 (×2): 50 ug via INTRAVENOUS
  Administered 2018-03-17: 25 ug via INTRAVENOUS

## 2018-03-17 MED ORDER — ACETAMINOPHEN 325 MG PO TABS: 325.0000 mg | ORAL_TABLET | ORAL | Status: DC | PRN

## 2018-03-17 MED ORDER — PHENYLEPHRINE HCL 10 MG/ML IJ SOLN
INTRAMUSCULAR | Status: AC
  Filled 2018-03-17: qty 1

## 2018-03-17 MED ORDER — DEXAMETHASONE SODIUM PHOSPHATE 10 MG/ML IJ SOLN
INTRAMUSCULAR | Status: AC
  Filled 2018-03-17: qty 1

## 2018-03-17 MED ORDER — CELECOXIB 200 MG PO CAPS
ORAL_CAPSULE | ORAL | Status: AC
  Filled 2018-03-17: qty 1

## 2018-03-17 MED ORDER — PROPOFOL 10 MG/ML IV BOLUS
INTRAVENOUS | Status: AC
  Filled 2018-03-17: qty 20

## 2018-03-17 MED ORDER — ONDANSETRON HCL 4 MG/2ML IJ SOLN
INTRAMUSCULAR | Status: AC
  Filled 2018-03-17: qty 2

## 2018-03-17 MED ORDER — FENTANYL CITRATE (PF) 100 MCG/2ML IJ SOLN
25.0000 ug | INTRAMUSCULAR | Status: DC | PRN
  Administered 2018-03-17 (×4): 25 ug via INTRAVENOUS

## 2018-03-17 MED ORDER — LACTATED RINGERS IV SOLN
INTRAVENOUS | Status: DC
  Administered 2018-03-17: 09:00:00 via INTRAVENOUS

## 2018-03-17 MED ORDER — LIDOCAINE HCL (CARDIAC) PF 100 MG/5ML IV SOSY
PREFILLED_SYRINGE | INTRAVENOUS | Status: DC | PRN
  Administered 2018-03-17: 100 mg via INTRAVENOUS

## 2018-03-17 MED ORDER — MIDAZOLAM HCL 2 MG/2ML IJ SOLN
INTRAMUSCULAR | Status: AC
  Filled 2018-03-17: qty 2

## 2018-03-17 MED ORDER — SUCCINYLCHOLINE CHLORIDE 20 MG/ML IJ SOLN
INTRAMUSCULAR | Status: AC
  Filled 2018-03-17: qty 1

## 2018-03-17 MED ORDER — SUCCINYLCHOLINE CHLORIDE 20 MG/ML IJ SOLN
INTRAMUSCULAR | Status: DC | PRN
  Administered 2018-03-17: 100 mg via INTRAVENOUS

## 2018-03-17 MED ORDER — BUPIVACAINE-EPINEPHRINE 0.25% -1:200000 IJ SOLN
INTRAMUSCULAR | Status: DC | PRN
  Administered 2018-03-17: 30 mL

## 2018-03-17 MED ORDER — LIDOCAINE HCL (PF) 2 % IJ SOLN
INTRAMUSCULAR | Status: AC
  Filled 2018-03-17: qty 10

## 2018-03-17 MED ORDER — ROCURONIUM BROMIDE 50 MG/5ML IV SOLN
INTRAVENOUS | Status: AC
  Filled 2018-03-17: qty 1

## 2018-03-17 MED ORDER — SODIUM CHLORIDE 0.9 % IV SOLN
INTRAVENOUS | Status: DC | PRN
  Administered 2018-03-17: 20 ug/min via INTRAVENOUS

## 2018-03-17 MED ORDER — MEPERIDINE HCL 50 MG/ML IJ SOLN: 6.2500 mg | INTRAMUSCULAR | Status: DC | PRN

## 2018-03-17 MED ORDER — PROMETHAZINE HCL 25 MG/ML IJ SOLN: 6.2500 mg | INTRAMUSCULAR | Status: DC | PRN

## 2018-03-17 MED ORDER — HYDROCODONE-ACETAMINOPHEN 5-325 MG PO TABS: 1.0000 | ORAL_TABLET | Freq: Four times a day (QID) | ORAL | 0 refills | Status: DC | PRN

## 2018-03-17 MED ORDER — ONDANSETRON HCL 4 MG/2ML IJ SOLN
INTRAMUSCULAR | Status: DC | PRN
  Administered 2018-03-17: 4 mg via INTRAVENOUS

## 2018-03-17 MED ORDER — PROPOFOL 10 MG/ML IV BOLUS
INTRAVENOUS | Status: DC | PRN
  Administered 2018-03-17: 140 mg via INTRAVENOUS

## 2018-03-17 MED ORDER — CEFAZOLIN SODIUM-DEXTROSE 2-4 GM/100ML-% IV SOLN
INTRAVENOUS | Status: AC
  Filled 2018-03-17: qty 100

## 2018-03-17 MED ORDER — SUGAMMADEX SODIUM 200 MG/2ML IV SOLN
INTRAVENOUS | Status: DC | PRN
  Administered 2018-03-17: 300 mg via INTRAVENOUS

## 2018-03-17 MED ORDER — CELECOXIB 200 MG PO CAPS: 200.0000 mg | ORAL_CAPSULE | ORAL | Status: DC

## 2018-03-17 MED ORDER — EPHEDRINE SULFATE 50 MG/ML IJ SOLN
INTRAMUSCULAR | Status: AC
  Filled 2018-03-17: qty 1

## 2018-03-17 MED ORDER — CHLORHEXIDINE GLUCONATE CLOTH 2 % EX PADS: 6.0000 | MEDICATED_PAD | Freq: Once | CUTANEOUS | Status: DC

## 2018-03-17 MED ORDER — GABAPENTIN 300 MG PO CAPS
300.0000 mg | ORAL_CAPSULE | ORAL | Status: AC
  Administered 2018-03-17: 300 mg via ORAL

## 2018-03-17 MED ORDER — DEXAMETHASONE SODIUM PHOSPHATE 10 MG/ML IJ SOLN
INTRAMUSCULAR | Status: DC | PRN
  Administered 2018-03-17: 10 mg via INTRAVENOUS

## 2018-03-17 MED ORDER — ACETAMINOPHEN 160 MG/5ML PO SOLN
325.0000 mg | ORAL | Status: DC | PRN
  Filled 2018-03-17: qty 20.3

## 2018-03-17 MED ORDER — HYDROCODONE-ACETAMINOPHEN 7.5-325 MG PO TABS
1.0000 | ORAL_TABLET | Freq: Once | ORAL | Status: DC | PRN
  Filled 2018-03-17: qty 1

## 2018-03-17 MED ORDER — PHENYLEPHRINE HCL 10 MG/ML IJ SOLN
INTRAMUSCULAR | Status: DC | PRN
  Administered 2018-03-17 (×3): 100 ug via INTRAVENOUS
  Administered 2018-03-17 (×2): 200 ug via INTRAVENOUS

## 2018-03-17 MED ORDER — FENTANYL CITRATE (PF) 250 MCG/5ML IJ SOLN
INTRAMUSCULAR | Status: AC
  Filled 2018-03-17: qty 5

## 2018-03-17 MED ORDER — ROCURONIUM BROMIDE 100 MG/10ML IV SOLN
INTRAVENOUS | Status: DC | PRN
  Administered 2018-03-17: 45 mg via INTRAVENOUS
  Administered 2018-03-17: 5 mg via INTRAVENOUS

## 2018-03-17 MED ORDER — FENTANYL CITRATE (PF) 100 MCG/2ML IJ SOLN
INTRAMUSCULAR | Status: AC
  Administered 2018-03-17: 25 ug via INTRAVENOUS
  Filled 2018-03-17: qty 2

## 2018-03-17 MED ORDER — ACETAMINOPHEN 500 MG PO TABS
ORAL_TABLET | ORAL | Status: AC
  Administered 2018-03-17: 1000 mg via ORAL
  Filled 2018-03-17: qty 2

## 2018-03-17 MED ORDER — FAMOTIDINE 20 MG PO TABS
20.0000 mg | ORAL_TABLET | Freq: Once | ORAL | Status: AC
  Administered 2018-03-17: 20 mg via ORAL

## 2018-03-17 MED ORDER — SUGAMMADEX SODIUM 200 MG/2ML IV SOLN
INTRAVENOUS | Status: AC
  Filled 2018-03-17: qty 4

## 2018-03-17 MED ORDER — ACETAMINOPHEN 500 MG PO TABS
1000.0000 mg | ORAL_TABLET | ORAL | Status: AC
  Administered 2018-03-17: 1000 mg via ORAL

## 2018-03-17 MED ORDER — FAMOTIDINE 20 MG PO TABS
ORAL_TABLET | ORAL | Status: AC
  Administered 2018-03-17: 20 mg via ORAL
  Filled 2018-03-17: qty 1

## 2018-03-17 MED ORDER — BUPIVACAINE-EPINEPHRINE (PF) 0.25% -1:200000 IJ SOLN
INTRAMUSCULAR | Status: AC
  Filled 2018-03-17: qty 30

## 2018-03-17 MED ORDER — MIDAZOLAM HCL 2 MG/2ML IJ SOLN
INTRAMUSCULAR | Status: DC | PRN
  Administered 2018-03-17: 2 mg via INTRAVENOUS

## 2018-03-17 SURGICAL SUPPLY — 43 items
ADH SKN CLS APL DERMABOND .7 (GAUZE/BANDAGES/DRESSINGS) ×1
BAG SPEC RTRVL LRG 6X4 10 (ENDOMECHANICALS) ×1
CANISTER SUCT 1200ML W/VALVE (MISCELLANEOUS) ×3 IMPLANT
CHLORAPREP W/TINT 26ML (MISCELLANEOUS) ×3 IMPLANT
CLIP VESOLOCK MED LG 6/CT (CLIP) ×6 IMPLANT
CORD BIP STRL DISP 12FT (MISCELLANEOUS) ×3 IMPLANT
COVER TIP SHEARS 8 DVNC (MISCELLANEOUS) IMPLANT
COVER TIP SHEARS 8MM DA VINCI (MISCELLANEOUS)
COVER WAND RF STERILE (DRAPES) ×3 IMPLANT
DECANTER SPIKE VIAL GLASS SM (MISCELLANEOUS) ×3 IMPLANT
DEFOGGER SCOPE WARMER CLEARIFY (MISCELLANEOUS) ×3 IMPLANT
DERMABOND ADVANCED (GAUZE/BANDAGES/DRESSINGS) ×2
DERMABOND ADVANCED .7 DNX12 (GAUZE/BANDAGES/DRESSINGS) ×1 IMPLANT
DRAPE SHEET LG 3/4 BI-LAMINATE (DRAPES) ×1 IMPLANT
ELECT REM PT RETURN 9FT ADLT (ELECTROSURGICAL) ×3
ELECTRODE REM PT RTRN 9FT ADLT (ELECTROSURGICAL) ×1 IMPLANT
GLOVE BIO SURGEON STRL SZ7 (GLOVE) ×6 IMPLANT
GOWN STRL REUS W/ TWL LRG LVL3 (GOWN DISPOSABLE) ×4 IMPLANT
GOWN STRL REUS W/TWL LRG LVL3 (GOWN DISPOSABLE) ×9
IRRIGATION STRYKERFLOW (MISCELLANEOUS) IMPLANT
IRRIGATOR STRYKERFLOW (MISCELLANEOUS)
IV NS 1000ML (IV SOLUTION)
IV NS 1000ML BAXH (IV SOLUTION) ×1 IMPLANT
KIT ACCESSORY DA VINCI DISP (KITS) ×2
KIT ACCESSORY DVNC DISP (KITS) ×1 IMPLANT
KIT PINK PAD W/HEAD ARE REST (MISCELLANEOUS) ×3
KIT PINK PAD W/HEAD ARM REST (MISCELLANEOUS) ×1 IMPLANT
LABEL OR SOLS (LABEL) ×3 IMPLANT
NEEDLE HYPO 22GX1.5 SAFETY (NEEDLE) ×3 IMPLANT
NS IRRIG 500ML POUR BTL (IV SOLUTION) ×3 IMPLANT
PACK LAP CHOLECYSTECTOMY (MISCELLANEOUS) ×3 IMPLANT
PENCIL ELECTRO HAND CTR (MISCELLANEOUS) ×3 IMPLANT
POUCH SPECIMEN RETRIEVAL 10MM (ENDOMECHANICALS) ×3 IMPLANT
PROGRASP ENDOWRIST DA VINCI (INSTRUMENTS)
PROGRASP ENDOWRIST DVNC (INSTRUMENTS) IMPLANT
SOLUTION ELECTROLUBE (MISCELLANEOUS) ×3 IMPLANT
SPONGE LAP 18X18 RF (DISPOSABLE) ×3 IMPLANT
STRAP SAFETY 5IN WIDE (MISCELLANEOUS) ×3 IMPLANT
SUT MNCRL AB 4-0 PS2 18 (SUTURE) ×3 IMPLANT
SUT VICRYL 0 AB UR-6 (SUTURE) ×6 IMPLANT
TROCAR 130MM GELPORT  DAV (MISCELLANEOUS) ×3 IMPLANT
TROCAR XCEL NON-BLD 5MMX100MML (ENDOMECHANICALS) ×3 IMPLANT
TUBING INSUF HEATED (TUBING) ×3 IMPLANT

## 2018-03-17 NOTE — Anesthesia Preprocedure Evaluation (Signed)
Anesthesia Evaluation  Patient identified by MRN, date of birth, ID band Patient awake    Reviewed: Allergy & Precautions, H&P , NPO status , Patient's Chart, lab work & pertinent test results  History of Anesthesia Complications Negative for: history of anesthetic complications  Airway Mallampati: II  TM Distance: <3 FB Neck ROM: full    Dental  (+) Chipped, Poor Dentition   Pulmonary neg shortness of breath, sleep apnea and Continuous Positive Airway Pressure Ventilation , former smoker,           Cardiovascular Exercise Tolerance: Good (-) angina(-) Past MI and (-) DOE negative cardio ROS       Neuro/Psych PSYCHIATRIC DISORDERS negative neurological ROS     GI/Hepatic Neg liver ROS, PUD, GERD  Medicated and Controlled,  Endo/Other  negative endocrine ROS  Renal/GU negative Renal ROS  negative genitourinary   Musculoskeletal   Abdominal   Peds  Hematology negative hematology ROS (+)   Anesthesia Other Findings Past Medical History: No date: Anemia     Comment:  prev bone marrow bx 02/11/10 No date: Anxiety No date: Bone cancer [facility]) 2004: Colitis No date: Colon polyps No date: Depression No date: Dysphagia No date: GERD (gastroesophageal reflux disease) 01/31/2013: Need for prophylactic vaccination and inoculation against  influenza No date: Neuropathy 09-12-10: Sleep apnea     Comment:  Uses CPAP No date: 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[facility])  Past Surgical History: 03/17/2011: COLONOSCOPY     Comment:  Procedure: COLONOSCOPY;  Surgeon: Jeryl Columbia, MD;                Location: [facility] ENDOSCOPY;  Service: Endoscopy;  Laterality:              N/A; 10/07/2016: COLONOSCOPY WITH PROPOFOL; N/A     Comment:  Procedure: COLONOSCOPY WITH PROPOFOL;  Surgeon: Manya Silvas, MD;  Location: [facility] ENDOSCOPY;  Service:               Endoscopy;  Laterality: N/A; 10/07/2016: ESOPHAGOGASTRODUODENOSCOPY  (EGD) WITH PROPOFOL; N/A     Comment:  Procedure: ESOPHAGOGASTRODUODENOSCOPY (EGD) WITH               PROPOFOL;  Surgeon: Manya Silvas, MD;  Location:               [facility] ENDOSCOPY;  Service: Endoscopy;  Laterality: N/A; January 2013: HIP FRACTURE SURGERY     Comment:  R hip 02/2016: HIP FRACTURE SURGERY; Right No date: NASAL FRACTURE SURGERY  BMI    Body Mass Index:  20.92 kg/m      Reproductive/Obstetrics negative OB ROS                             Anesthesia Physical  Anesthesia Plan  ASA: III  Anesthesia Plan: General   Post-op Pain Management:    Induction: Intravenous  PONV Risk Score and Plan: Ondansetron, Dexamethasone and Treatment may vary due to age or medical condition  Airway Management Planned: Oral ETT  Additional Equipment:   Intra-op Plan:   Post-operative Plan: Extubation in OR  Informed Consent: I have reviewed the patients History and Physical, chart, labs and discussed the procedure including the risks, benefits and alternatives for the proposed anesthesia with the patient or authorized representative who has indicated his/her understanding and acceptance.   Dental Advisory Given  Plan Discussed with: Anesthesiologist, CRNA and  Surgeon  Anesthesia Plan Comments: (Patient consented for risks of anesthesia including but not limited to:  - adverse reactions to medications - risk of intubation if required - damage to teeth, lips or other oral mucosa - sore throat or hoarseness - Damage to heart, brain, lungs or loss of life  Patient voiced understanding.)        Anesthesia Quick Evaluation

## 2018-03-17 NOTE — Interval H&P Note (Signed)
History and Physical Interval Note:  03/17/2018 12:40 PM  [name]  has presented today for surgery, with the diagnosis of BILIARY  The various methods of treatment have been discussed with the patient and family. After consideration of risks, benefits and other options for treatment, the patient has consented to  Procedure(s): ROBOTIC [place] (N/A) as a surgical intervention .  The patient's history has been reviewed, patient examined, no change in status, stable for surgery.  I have reviewed the patient's chart and labs.  Questions were answered to the patient's satisfaction.     [name]

## 2018-03-17 NOTE — Transfer of Care (Signed)
Immediate Anesthesia Transfer of Care Note  Patient: [name]  Procedure(s) Performed: ROBOTIC ASSISTED LAPAROSCOPIC CHOLECYSTECTOMY-MULTI SITE (N/A )  Patient Location: PACU  Anesthesia Type:General  Level of Consciousness: awake and patient cooperative  Airway & Oxygen Therapy: Patient Spontanous Breathing and Patient connected to face mask  Post-op Assessment: Report given to RN and Post -op Vital signs reviewed and stable  Post vital signs: stable  Last Vitals:  Vitals Value Taken Time  BP 118/65 03/17/2018  3:00 PM  Temp 36.3 C 03/17/2018  3:00 PM  Pulse 101 03/17/2018  3:04 PM  Resp 11 03/17/2018  3:04 PM  SpO2 97 % 03/17/2018  3:04 PM  Vitals shown include unvalidated device data.  Last Pain:  Vitals:   03/17/18 0851  TempSrc: Tympanic         Complications: No apparent anesthesia complications

## 2018-03-17 NOTE — Anesthesia Post-op Follow-up Note (Signed)
Anesthesia QCDR form completed.        

## 2018-03-17 NOTE — Discharge Instructions (Addendum)
Laparoscopic Cholecystectomy, Care After  ° °These instructions give you information on caring for yourself after your procedure. Your doctor may also give you more specific instructions. Call your doctor if you have any problems or questions after your procedure.  °HOME CARE  °Change your bandages (dressings) as told by your doctor.  °Keep the wound dry and clean. Wash the wound gently with soap and water. Pat the wound dry with a clean towel.  °Do not take baths, swim, or use hot tubs for 2 weeks, or as told by your doctor.  °Only take medicine as told by your doctor.  °Eat a normal diet as told by your doctor.  °Do not lift anything heavier than 10 pounds (4.5 kg) until your doctor says it is okay.  °Do not play contact sports for 1 week, or as told by your doctor. °GET HELP IF:  °Your wound is red, puffy (swollen), or painful.  °You have yellowish-white fluid (pus) coming from the wound.  °You have fluid draining from the wound for more than 1 day.  °You have a bad smell coming from the wound.  °Your wound breaks open. °GET HELP RIGHT AWAY IF:  °You have trouble breathing.  °You have chest pain.  °You have a fever >101  °You have pain in the shoulders (shoulder strap areas) that is getting worse.  °You feel dizzy or pass out (faint).  °You have severe belly (abdominal) pain.  °You feel sick to your stomach (nauseous) or throw up (vomit) for more than 1 day. ° ° ° °AMBULATORY SURGERY  °DISCHARGE INSTRUCTIONS ° ° °1) The drugs that you were given will stay in your system until tomorrow so for the next 24 hours you should not: ° °A) Drive an automobile °B) Make any legal decisions °C) Drink any alcoholic beverage ° ° °2) You may resume regular meals tomorrow.  Today it is better to start with liquids and gradually work up to solid foods. ° °You may eat anything you prefer, but it is better to start with liquids, then soup and crackers, and gradually work up to solid foods. ° ° °3) Please notify your doctor  immediately if you have any unusual bleeding, trouble breathing, redness and pain at the surgery site, drainage, fever, or pain not relieved by medication. ° ° ° °4) Additional Instructions: ° ° ° ° ° ° ° °Please contact your physician with any problems or Same Day Surgery at [phone], Monday through Friday 6 am to 4 pm, or [facility] number at [phone]. ° ° °

## 2018-03-17 NOTE — Op Note (Addendum)
Robotic assisted Laparoscopic Cholecystectomy  Pre-operative Diagnosis: Biliary dyskinesia  Post-operative Diagnosis: Chronic cholecystitis  Procedure: Robotic assisted laparoscopic cholecystectomy  Surgeon: Caroleen Hamman, MD FACS  Anesthesia: Gen. with endotracheal tube    Findings: Chronic Cholecystitis  ICG  without evidence of bile leak or  variant biliary ductal anatomy  Estimated Blood Loss: 10cc         Drains: none         Specimens: Gallbladder           Complications: none   Procedure Details  The patient was seen again in the Holding Room. The benefits, complications, treatment options, and expected outcomes were discussed with the patient. The risks of bleeding, infection, recurrence of symptoms, failure to resolve symptoms, bile duct damage, bile duct leak, retained common bile duct stone, bowel injury, any of which could require further surgery and/or ERCP, stent, or papillotomy were reviewed with the patient. The likelihood of improving the patient's symptoms with return to their baseline status is good.  The patient and/or family concurred with the proposed plan, giving informed consent.  The patient was taken to Operating Room, identified as [name] and the procedure verified as Laparoscopic Cholecystectomy.  A Time Out was held and the above information confirmed.  Prior to the induction of general anesthesia, antibiotic prophylaxis was administered. VTE prophylaxis was in place. General endotracheal anesthesia was then administered and tolerated well. After the induction, the abdomen was prepped with Chloraprep and draped in the sterile fashion. The patient was positioned in the supine position.  Cut down technique was used to enter the abdominal cavity and a Hasson trochar was placed after two vicryl stitches were anchored to the fascia. Pneumoperitoneum was then created with CO2 and tolerated well without any adverse changes in the patient's vital signs.   Three 8-mm ports were placed under direct vision. All skin incisions  were infiltrated with a local anesthetic agent before making the incision and placing the trocars.    The patient was positioned  in reverse Trendelenburg and the robot was brought to the surgical field and docked in the standard fashion.  All instrumentation was kept under direct visualization at all times and we avoided any collision between the arms I scrubbed out and went to the console. The gallbladder was identified, the fundus grasped and retracted cephalad. Adhesions were lysed bluntly. The infundibulum was grasped and retracted laterally, exposing the peritoneum overlying the triangle of Calot. This was then divided and exposed in a blunt fashion. An extended critical view of the cystic duct and cystic artery was obtained.  The cystic duct was clearly identified and bluntly dissected.   Artery and duct were double clipped and divided.  The gallbladder was taken from the gallbladder fossa in a retrograde fashion with the electrocautery. There Was no evidence of any bile leak by ICG cholangiography.  All the robotic instruments were removed and the robot was undocked  The gallbladder was removed and placed in an Endocatch bag.  Inspection of the right upper quadrant was performed. No bleeding, bile duct injury or leak, or bowel injury was noted. Pneumoperitoneum was released.  The periumbilical port site was closed with interrumpted 0 Vicryl sutures. 4-0 subcuticular Monocryl was used to close the skin. Dermabond was  applied.  The patient was then extubated and brought to the recovery room in stable condition. Sponge, lap, and needle counts were correct at closure and at the conclusion of the case.  Caroleen Hamman, MD, FACS

## 2018-03-17 NOTE — Anesthesia Procedure Notes (Addendum)
Procedure Name: Intubation Date/Time: 03/17/2018 1:39 PM Performed by: Lavone Orn, CRNA Pre-anesthesia Checklist: Patient identified, Emergency Drugs available, Suction available, Patient being monitored and Timeout performed Patient Re-evaluated:Patient Re-evaluated prior to induction Oxygen Delivery Method: Circle system utilized Preoxygenation: Pre-oxygenation with 100% oxygen Induction Type: IV induction Ventilation: Mask ventilation without difficulty Laryngoscope Size: Mac and 4 Grade View: Grade II Tube type: Oral Number of attempts: 1 Airway Equipment and Method: Stylet (cricoid pressure) Placement Confirmation: ETT inserted through vocal cords under direct vision,  positive ETCO2 and breath sounds checked- equal and bilateral Secured at: 23 cm Tube secured with: Tape Dental Injury: Teeth and Oropharynx as per pre-operative assessment  Difficulty Due To: Difficulty was anticipated, Difficult Airway- due to anterior larynx and Difficult Airway- due to limited oral opening

## 2018-03-18 ENCOUNTER — Encounter: Payer: Self-pay | Admitting: Surgery

## 2018-03-21 LAB — SURGICAL PATHOLOGY

## 2018-03-22 ENCOUNTER — Ambulatory Visit: Payer: Medicare Other | Admitting: Gastroenterology

## 2018-03-22 NOTE — Anesthesia Postprocedure Evaluation (Signed)
Anesthesia Post Note  Patient: [name]  Procedure(s) Performed: ROBOTIC ASSISTED LAPAROSCOPIC CHOLECYSTECTOMY-MULTI SITE (N/A )  Patient location during evaluation: PACU Anesthesia Type: General Level of consciousness: awake and alert Pain management: pain level controlled Vital Signs Assessment: post-procedure vital signs reviewed and stable Respiratory status: spontaneous breathing, nonlabored ventilation, respiratory function stable and patient connected to nasal cannula oxygen Cardiovascular status: blood pressure returned to baseline and stable Postop Assessment: no apparent nausea or vomiting Anesthetic complications: no     Last Vitals:  Vitals:   03/17/18 1603 03/17/18 1625  BP: 120/81 125/75  Pulse: 97 96  Resp: 18 18  Temp: 36.6 C 36.6 C  SpO2: 97% 98%    Last Pain:  Vitals:   03/18/18 0824  TempSrc:   PainSc: 3                  Renaye Janicki Harvie Heck

## 2018-03-30 ENCOUNTER — Other Ambulatory Visit: Payer: Self-pay

## 2018-03-30 ENCOUNTER — Encounter: Payer: Self-pay | Admitting: Surgery

## 2018-03-30 ENCOUNTER — Ambulatory Visit (INDEPENDENT_AMBULATORY_CARE_PROVIDER_SITE_OTHER): Payer: Medicare Other | Admitting: Surgery

## 2018-03-30 VITALS — BP 118/68 | HR 70 | Temp 97.9°F | Resp 13 | Ht 71.0 in | Wt 155.0 lb

## 2018-03-30 DIAGNOSIS — Z09 Encounter for follow-up examination after completed treatment for conditions other than malignant neoplasm: Secondary | ICD-10-CM

## 2018-03-30 NOTE — Patient Instructions (Signed)
Return as needed.The patient is aware to call back for any questions or concerns.  

## 2018-03-30 NOTE — Progress Notes (Signed)
S/p rob Chole 12/5 Path d/w pt Pt feels much better, no abd pain. Taking PO, no fevers or chills  PE NAD Abd: soft, nt, incisions c/d/i. No infection  A/ p Doing very well No heavy lifting RTC prn

## 2018-04-08 DIAGNOSIS — Z96649 Presence of unspecified artificial hip joint: Secondary | ICD-10-CM | POA: Insufficient documentation

## 2018-05-11 ENCOUNTER — Ambulatory Visit: Payer: Medicare Other | Admitting: Gastroenterology

## 2018-05-12 ENCOUNTER — Encounter: Payer: Self-pay | Admitting: Gastroenterology

## 2018-05-12 ENCOUNTER — Ambulatory Visit (INDEPENDENT_AMBULATORY_CARE_PROVIDER_SITE_OTHER): Payer: Medicare Other | Admitting: Gastroenterology

## 2018-05-12 VITALS — BP 107/69 | HR 82 | Ht 71.0 in | Wt 158.4 lb

## 2018-05-12 DIAGNOSIS — R1011 Right upper quadrant pain: Secondary | ICD-10-CM | POA: Diagnosis not present

## 2018-05-12 DIAGNOSIS — K3189 Other diseases of stomach and duodenum: Secondary | ICD-10-CM | POA: Diagnosis not present

## 2018-05-12 DIAGNOSIS — K31A0 Gastric intestinal metaplasia, unspecified: Secondary | ICD-10-CM

## 2018-05-12 NOTE — Progress Notes (Signed)
Jonathon Bellows MD, MRCP(U.K) [address]  [place]  [place], [place] [address]  Main: [phone]  Fax: [phone]   Primary Care Physician: Maryland Pink, MD  Primary Gastroenterologist:  Dr. Jonathon Bellows   No chief complaint on file.   HPI: [name] is a 68 y.o. male  Summary of history :  He is being followed for GERD, biliary dyskinesia.  He was initially referred and seen on 01/20/2018 for abdominal pain.He was seen by Dr Mike Gip for [facility] macroglobulinemia and expressed interest to switch over to a new GI for another evaluation previously been a patient of [facility].    H/o colon polyps. H/o dysphagia , been on a PP for many years. Last colonoscopy 09/2016 - adenomas resected, diverticulosis and internal hemorroids noted.   EGD 09/2016 showed LA grade A esophagitis .Intestinal metaplaisa of the stomach with no H pylori . MRCP 11/2017 showed no stones in CBD or no dilation although prior CT scan showed obstruction in CBD. He has been treated for diverticulitis in the past.   01/13/18 : CT abdomen shows no acute abnormality in the abdomen .   The abdominal pain began after his EGD and colonoscopy in 2018, continuous, wakes him up at night, right lower quadrant and radiates across his abdomen. Severe in nature like a knife. Worse with fast food and greasy foods. LHas his gallbladder intact. Soft bowel movements.  01/20/2018: H. pylori breath test: Negative 02/08/2018: HIDA scan: Low normal ejection fraction. Patent cystic and common bile ducts. 01/24/2018: EGD: Gastritis and duodenitis noted. Biopsies taken as per gastric mapping protocol: Duodenitis, nonspecific chronic inflammation of the stomach, no H. pylori noted no intestinal metaplasia noted.  Interval history11/27/19  to 05/12/2018  Underwent cholecystectomy by Dr Dahlia Byes   Weight stable , gained 2 lbs   Doing well eating burgers, all pain fully resolved.  Current Outpatient  Medications  Medication Sig Dispense Refill  . acetaminophen (TYLENOL) 500 MG tablet Take 500 mg by mouth every 6 (six) hours as needed.    . pantoprazole (PROTONIX) 40 MG tablet Take 40 mg by mouth daily.     Marland Kitchen PARoxetine (PAXIL) 40 MG tablet Take 40 mg by mouth every morning.     No current facility-administered medications for this visit.     Allergies as of 05/12/2018 - Review Complete 03/30/2018  Allergen Reaction Noted  . Ibrutinib Nausea Only 08/21/2014    ROS:  General: Negative for anorexia, weight loss, fever, chills, fatigue, weakness. ENT: Negative for hoarseness, difficulty swallowing , nasal congestion. CV: Negative for chest pain, angina, palpitations, dyspnea on exertion, peripheral edema.  Respiratory: Negative for dyspnea at rest, dyspnea on exertion, cough, sputum, wheezing.  GI: See history of present illness. GU:  Negative for dysuria, hematuria, urinary incontinence, urinary frequency, nocturnal urination.  Endo: Negative for unusual weight change.    Physical Examination:   There were no vitals taken for this visit.  General: Well-nourished, well-developed in no acute distress.  Eyes: No icterus. Conjunctivae pink. Mouth: Oropharyngeal mucosa moist and pink , no lesions erythema or exudate. Lungs: Clear to auscultation bilaterally. Non-labored. Heart: Regular rate and rhythm, no murmurs rubs or gallops.  Abdomen: Bowel sounds are normal, nontender, nondistended, no hepatosplenomegaly or masses, no abdominal bruits or hernia , no rebound or guarding.   Extremities: No lower extremity edema. No clubbing or deformities. Neuro: Alert and oriented x 3.  Grossly intact. Skin: Warm and dry, no jaundice.   Psych: Alert and cooperative,  normal mood and affect.   Imaging Studies: No results found.  Assessment and Plan:   [name] is a 68 y.o. y/o male here to follow up for abdominal pain RUQ which did not resolve despite maximal treatment for GERD.   HIDA scan showed features suggestive of biliary dyskinesia.  Status post cholecystectomy. Abdominal pain resolved. Also has a history of gastric intestinal metaplasia. Plan  1. EGDto be repeated once more in 3 years due to prior diagnosis of gastric intestinal metaplasia although none seen on last EGD on 01/24/2018  Dr Jonathon Bellows  MD,MRCP [facility]) Follow up PRN

## 2018-07-19 ENCOUNTER — Other Ambulatory Visit (INDEPENDENT_AMBULATORY_CARE_PROVIDER_SITE_OTHER): Payer: Medicare Other

## 2018-07-19 ENCOUNTER — Ambulatory Visit (INDEPENDENT_AMBULATORY_CARE_PROVIDER_SITE_OTHER): Payer: Medicare Other | Admitting: Vascular Surgery

## 2018-07-25 ENCOUNTER — Ambulatory Visit: Payer: Medicare Other | Admitting: Oncology

## 2018-07-25 ENCOUNTER — Other Ambulatory Visit: Payer: Medicare Other

## 2018-09-01 ENCOUNTER — Other Ambulatory Visit: Payer: Medicare Other

## 2018-09-01 ENCOUNTER — Ambulatory Visit: Payer: Medicare Other | Admitting: Oncology

## 2018-09-22 ENCOUNTER — Encounter: Payer: Self-pay | Admitting: Oncology

## 2018-09-22 ENCOUNTER — Other Ambulatory Visit: Payer: Self-pay

## 2018-09-22 ENCOUNTER — Inpatient Hospital Stay (HOSPITAL_BASED_OUTPATIENT_CLINIC_OR_DEPARTMENT_OTHER): Payer: Medicare Other | Admitting: Oncology

## 2018-09-22 ENCOUNTER — Inpatient Hospital Stay: Payer: Medicare Other | Attending: Oncology

## 2018-09-22 VITALS — BP 108/69 | HR 69 | Temp 98.0°F | Resp 20 | Ht 71.0 in | Wt 153.2 lb

## 2018-09-22 DIAGNOSIS — R5383 Other fatigue: Secondary | ICD-10-CM | POA: Diagnosis not present

## 2018-09-22 DIAGNOSIS — C880 Waldenstrom macroglobulinemia: Secondary | ICD-10-CM | POA: Insufficient documentation

## 2018-09-22 DIAGNOSIS — I714 Abdominal aortic aneurysm, without rupture: Secondary | ICD-10-CM | POA: Insufficient documentation

## 2018-09-22 DIAGNOSIS — Z79899 Other long term (current) drug therapy: Secondary | ICD-10-CM

## 2018-09-22 DIAGNOSIS — R61 Generalized hyperhidrosis: Secondary | ICD-10-CM | POA: Diagnosis not present

## 2018-09-22 DIAGNOSIS — Z87891 Personal history of nicotine dependence: Secondary | ICD-10-CM | POA: Diagnosis not present

## 2018-09-22 LAB — CBC WITH DIFFERENTIAL/PLATELET
Abs Immature Granulocytes: 0.01 10*3/uL (ref 0.00–0.07)
Basophils Absolute: 0 10*3/uL (ref 0.0–0.1)
Basophils Relative: 1 %
Eosinophils Absolute: 0.1 10*3/uL (ref 0.0–0.5)
Eosinophils Relative: 2 %
HCT: 39.9 % (ref 39.0–52.0)
Hemoglobin: 13.4 g/dL (ref 13.0–17.0)
Immature Granulocytes: 0 %
Lymphocytes Relative: 33 %
Lymphs Abs: 1.8 10*3/uL (ref 0.7–4.0)
MCH: 30.2 pg (ref 26.0–34.0)
MCHC: 33.6 g/dL (ref 30.0–36.0)
MCV: 90.1 fL (ref 80.0–100.0)
Monocytes Absolute: 0.5 10*3/uL (ref 0.1–1.0)
Monocytes Relative: 10 %
Neutro Abs: 2.9 10*3/uL (ref 1.7–7.7)
Neutrophils Relative %: 54 %
Platelets: 360 10*3/uL (ref 150–400)
RBC: 4.43 MIL/uL (ref 4.22–5.81)
RDW: 14.1 % (ref 11.5–15.5)
WBC: 5.4 10*3/uL (ref 4.0–10.5)
nRBC: 0 % (ref 0.0–0.2)

## 2018-09-22 LAB — COMPREHENSIVE METABOLIC PANEL
ALT: 19 U/L (ref 0–44)
AST: 23 U/L (ref 15–41)
Albumin: 4.2 g/dL (ref 3.5–5.0)
Alkaline Phosphatase: 95 U/L (ref 38–126)
Anion gap: 9 (ref 5–15)
BUN: 33 mg/dL — ABNORMAL HIGH (ref 8–23)
CO2: 25 mmol/L (ref 22–32)
Calcium: 8.9 mg/dL (ref 8.9–10.3)
Chloride: 103 mmol/L (ref 98–111)
Creatinine, Ser: 1.14 mg/dL (ref 0.61–1.24)
GFR calc Af Amer: >60 mL/min (ref >60)
GFR calc non Af Amer: >60 mL/min (ref >60)
Glucose, Bld: 100 mg/dL — ABNORMAL HIGH (ref 70–99)
Potassium: 4.3 mmol/L (ref 3.5–5.1)
Sodium: 137 mmol/L (ref 135–145)
Total Bilirubin: 1.2 mg/dL (ref 0.3–1.2)
Total Protein: 7.2 g/dL (ref 6.5–8.1)

## 2018-09-22 NOTE — Progress Notes (Signed)
Hematology/Oncology Consult note [facility]  [phone] [phone]  Patient Care Team: Maryland Pink, MD as PCP - General (Family Medicine) Clarene Essex, MD (Gastroenterology) Vladimir Crofts, MD as Consulting Physician (Neurology) Melvyn Novas, MD as Referring Physician (Orthopedic Surgery) Marval Regal, NP as Nurse Practitioner (Nurse Practitioner) Hollice Espy, MD as Consulting Physician (Urology) Lucky Cowboy Erskine Squibb, MD as Referring Physician (Vascular Surgery)   Name of the patient: [name]  [id number]  08-Nov-1950   Date of visit: 09/22/18  Diagnosis-Walden Strom's macroglobulinemia currently in remission  Chief complaint/ Reason for visit-routine follow-up of [name] macroglobulinemia  Heme/Onc history: Patient is a 68 year old male with a history of Walden Strom's macroglobulinemia and was seeing Dr. Mike Gip previously and is transferring his care to me.  He was initially diagnosed with [name] macroglobulinemia in 2012 when he presented with headache and visual changes.  He received weekly Rituxan x4 followed by maintenance Rituxan for 2 years until March 2014.  He had recurrence of dizziness and vertigo in June 2015 and underwent plasmapheresis at [facility] with improvement of symptoms and he was retreated with Rituxan x4.  He completed this treatment in July 2015.  He was started on maintenance ibrutinib in November 2015 which he did not tolerate.  Maintenance Rituxan was started thereafter and stopped in November 2016.  His IgM levels have been between 3000-5000 when he is symptomatic and presently in the 400s.  Patient also has chronic abdominal pain and CT scans have been unrevealing.  He follows up with GI for this  Interval history-overall patient is doing well.  Reports that his appetite is good and he denies any unintentional weight loss.  He has some night sweats on and off which has been chronic for  many years.  Reports intermittent mild abdominal discomfort.  ECOG PS- 1 Pain scale- 0 Opioid associated constipation- no  Review of systems- Review of Systems  Constitutional: Positive for malaise/fatigue. Negative for chills, fever and weight loss.  HENT: Negative for congestion, ear discharge and nosebleeds.   Eyes: Negative for blurred vision.  Respiratory: Negative for cough, hemoptysis, sputum production, shortness of breath and wheezing.   Cardiovascular: Negative for chest pain, palpitations, orthopnea and claudication.  Gastrointestinal: Positive for abdominal pain. Negative for blood in stool, constipation, diarrhea, heartburn, melena, nausea and vomiting.  Genitourinary: Negative for dysuria, flank pain, frequency, hematuria and urgency.  Musculoskeletal: Negative for back pain, joint pain and myalgias.  Skin: Negative for rash.  Neurological: Negative for dizziness, tingling, focal weakness, seizures, weakness and headaches.  Endo/Heme/Allergies: Does not bruise/bleed easily.  Psychiatric/Behavioral: Negative for depression and suicidal ideas. The patient does not have insomnia.        Allergies  Allergen Reactions  . Ibrutinib Nausea Only     Past Medical History:  Diagnosis Date  . Anemia    prev bone marrow bx 02/11/10  . Anxiety   . Bone cancer ([place])   . Colitis 2004  . Colon polyps   . Depression   . Dysphagia   . GERD (gastroesophageal reflux disease)   . Need for prophylactic vaccination and inoculation against influenza 01/31/2013  . Neuropathy   . Sleep apnea 09-12-10   Uses CPAP  . 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[facility])      Past Surgical History:  Procedure Laterality Date  . COLONOSCOPY  03/17/2011   Procedure: COLONOSCOPY;  Surgeon: Jeryl Columbia, MD;  Location: [facility] ENDOSCOPY;  Service: Endoscopy;  Laterality: N/A;  .  COLONOSCOPY WITH PROPOFOL N/A 10/07/2016   Procedure: COLONOSCOPY WITH PROPOFOL;  Surgeon: Manya Silvas, MD;  Location: [facility]  ENDOSCOPY;  Service: Endoscopy;  Laterality: N/A;  . ESOPHAGOGASTRODUODENOSCOPY (EGD) WITH PROPOFOL N/A 10/07/2016   Procedure: ESOPHAGOGASTRODUODENOSCOPY (EGD) WITH PROPOFOL;  Surgeon: Manya Silvas, MD;  Location: [facility] ENDOSCOPY;  Service: Endoscopy;  Laterality: N/A;  . ESOPHAGOGASTRODUODENOSCOPY (EGD) WITH PROPOFOL N/A 01/24/2018   Procedure: ESOPHAGOGASTRODUODENOSCOPY (EGD) WITH PROPOFOL with Gastric Mapping;  Surgeon: Jonathon Bellows, MD;  Location[facility] ENDOSCOPY;  Service: Gastroenterology;  Laterality: N/A;  . HIP FRACTURE SURGERY  January 2013   R hip  . HIP FRACTURE SURGERY Right 02/2016  . NASAL FRACTURE SURGERY    . ROBOTIC ASSISTED LAPAROSCOPIC CHOLECYSTECTOMY-MULTI SITE N/A 03/17/2018   Procedure: ROBOTIC ASSISTED LAPAROSCOPIC CHOLECYSTECTOMY-MULTI SITE;  Surgeon: Jules Husbands, MD;  Location: [facility] ORS;  Service: General;  Laterality: N/A;    Social History   Socioeconomic History  . Marital status: Married    Spouse name: Not on file  . Number of children: Not on file  . Years of education: Not on file  . Highest education level: Not on file  Occupational History  . Not on file  Social Needs  . Financial resource strain: Not on file  . Food insecurity    Worry: Not on file    Inability: Not on file  . Transportation needs    Medical: Not on file    Non-medical: Not on file  Tobacco Use  . Smoking status: Former Smoker    Quit date: 04/14/2007    Years since quitting: 11.4  . Smokeless tobacco: Never Used  Substance and Sexual Activity  . Alcohol use: No  . Drug use: No  . Sexual activity: Not on file  Lifestyle  . Physical activity    Days per week: Not on file    Minutes per session: Not on file  . Stress: Not on file  Relationships  . Social Herbalist on phone: Not on file    Gets together: Not on file    Attends religious service: Not on file    Active member of club or organization: Not on file    Attends meetings of clubs or  organizations: Not on file    Relationship status: Not on file  . Intimate partner violence    Fear of current or ex partner: Not on file    Emotionally abused: Not on file    Physically abused: Not on file    Forced sexual activity: Not on file  Other Topics Concern  . Not on file  Social History Narrative  . Not on file    Family History  Problem Relation Age of Onset  . Prostate cancer Neg Hx   . Bladder Cancer Neg Hx   . Kidney cancer Neg Hx      Current Outpatient Medications:  .  acetaminophen (TYLENOL) 500 MG tablet, Take 500 mg by mouth every 6 (six) hours as needed., Disp: , Rfl:  .  pantoprazole (PROTONIX) 40 MG tablet, Take 40 mg by mouth daily. , Disp: , Rfl:  .  PARoxetine (PAXIL) 40 MG tablet, Take 40 mg by mouth every morning., Disp: , Rfl:   Physical exam:  Vitals:   09/22/18 0957  BP: 108/69  Pulse: 69  Resp: 20  Temp: 98 F (36.7 C)  TempSrc: Tympanic  Weight: 153 lb 3.2 oz (69.5 kg)  Height: 5\' 11"  (1.803 m)   Physical Exam  HENT:     Head: Normocephalic and atraumatic.  Eyes:     Pupils: Pupils are equal, round, and reactive to light.  Neck:     Musculoskeletal: Normal range of motion.  Cardiovascular:     Rate and Rhythm: Normal rate and regular rhythm.     Heart sounds: Normal heart sounds.  Pulmonary:     Effort: Pulmonary effort is normal.     Breath sounds: Normal breath sounds.  Abdominal:     General: Bowel sounds are normal. There is no distension.     Palpations: Abdomen is soft.     Tenderness: There is no abdominal tenderness.     Comments: No palpable hepatosplenomegaly  Musculoskeletal:     Right lower leg: No edema.     Left lower leg: No edema.  Skin:    General: Skin is warm and dry.  Neurological:     Mental Status: He is alert and oriented to person, place, and time.      CMP Latest Ref Rng & Units 09/22/2018  Glucose 70 - 99 mg/dL 100(H)  BUN 8 - 23 mg/dL 33(H)  Creatinine 0.61 - 1.24 mg/dL 1.14  Sodium 135 -  145 mmol/L 137  Potassium 3.5 - 5.1 mmol/L 4.3  Chloride 98 - 111 mmol/L 103  CO2 22 - 32 mmol/L 25  Calcium 8.9 - 10.3 mg/dL 8.9  Total Protein 6.5 - 8.1 g/dL 7.2  Total Bilirubin 0.3 - 1.2 mg/dL 1.2  Alkaline Phos 38 - 126 U/L 95  AST 15 - 41 U/L 23  ALT 0 - 44 U/L 19   CBC Latest Ref Rng & Units 09/22/2018  WBC 4.0 - 10.5 K/uL 5.4  Hemoglobin 13.0 - 17.0 g/dL 13.4  Hematocrit 39.0 - 52.0 % 39.9  Platelets 150 - 400 K/uL 360     Assessment and plan- Patient is a 68 y.o. male with Walden Strom's macroglobulinemia currently in remission  IgM levels from today are pending.  CBC and CMP within normal limits.  Clinically no signs and symptoms suspicious for recurrence.  Abdominal aortic aneurysm: Being followed by vascular surgery  I will see him back in 6 months with CBC with differential, CMP and myeloma panel   Visit Diagnosis 1. 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[place])      Dr. Randa Evens, MD, MPH [facility] [phone] 09/22/2018 2:57 PM

## 2018-09-23 ENCOUNTER — Encounter (INDEPENDENT_AMBULATORY_CARE_PROVIDER_SITE_OTHER): Payer: Self-pay | Admitting: Vascular Surgery

## 2018-09-23 ENCOUNTER — Ambulatory Visit (INDEPENDENT_AMBULATORY_CARE_PROVIDER_SITE_OTHER): Payer: Medicare Other | Admitting: Vascular Surgery

## 2018-09-23 ENCOUNTER — Ambulatory Visit (INDEPENDENT_AMBULATORY_CARE_PROVIDER_SITE_OTHER): Payer: Medicare Other

## 2018-09-23 VITALS — BP 109/68 | HR 74 | Resp 16 | Ht 71.0 in | Wt 151.8 lb

## 2018-09-23 DIAGNOSIS — I714 Abdominal aortic aneurysm, without rupture, unspecified: Secondary | ICD-10-CM

## 2018-09-23 DIAGNOSIS — I700 Atherosclerosis of aorta: Secondary | ICD-10-CM | POA: Diagnosis not present

## 2018-09-23 LAB — IGM: IgM (Immunoglobulin M), Srm: 416 mg/dL — ABNORMAL HIGH (ref 20–172)

## 2018-09-23 NOTE — Progress Notes (Signed)
MRN : [id number]  [name] is a 68 y.o. (1950-10-01) male who presents with chief complaint of  Chief Complaint  Patient presents with  . Follow-up    78yr AAA   .  History of Present Illness: Patient returns today in follow up of his aortic atherosclerosis and small abdominal aorta aneurysm.  He is doing well today.  He has had a cholecystectomy since his last visit but no other major issues.  He has no current aneurysm related symptoms.  His aortic duplex today shows a stable 3.0 cm abdominal aortic aneurysm without significant progression from previous study.  Current Outpatient Medications  Medication Sig Dispense Refill  . acetaminophen (TYLENOL) 500 MG tablet Take 500 mg by mouth every 6 (six) hours as needed.    . pantoprazole (PROTONIX) 40 MG tablet Take 40 mg by mouth daily.     Marland Kitchen PARoxetine (PAXIL) 40 MG tablet Take 40 mg by mouth every morning.     No current facility-administered medications for this visit.     Past Medical History:  Diagnosis Date  . Anemia    prev bone marrow bx 02/11/10  . Anxiety   . Bone cancer ([place])   . Colitis 2004  . Colon polyps   . Depression   . Dysphagia   . GERD (gastroesophageal reflux disease)   . Need for prophylactic vaccination and inoculation against influenza 01/31/2013  . Neuropathy   . Sleep apnea 09-12-10   Uses CPAP  . 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[facility])     Past Surgical History:  Procedure Laterality Date  . COLONOSCOPY  03/17/2011   Procedure: COLONOSCOPY;  Surgeon: Jeryl Columbia, MD;  Location: [facility] ENDOSCOPY;  Service: Endoscopy;  Laterality: N/A;  . COLONOSCOPY WITH PROPOFOL N/A 10/07/2016   Procedure: COLONOSCOPY WITH PROPOFOL;  Surgeon: Manya Silvas, MD;  Location[facility] ENDOSCOPY;  Service: Endoscopy;  Laterality: N/A;  . ESOPHAGOGASTRODUODENOSCOPY (EGD) WITH PROPOFOL N/A 10/07/2016   Procedure: ESOPHAGOGASTRODUODENOSCOPY (EGD) WITH PROPOFOL;  Surgeon: Manya Silvas, MD;  Location: [facility] ENDOSCOPY;   Service: Endoscopy;  Laterality: N/A;  . ESOPHAGOGASTRODUODENOSCOPY (EGD) WITH PROPOFOL N/A 01/24/2018   Procedure: ESOPHAGOGASTRODUODENOSCOPY (EGD) WITH PROPOFOL with Gastric Mapping;  Surgeon: Jonathon Bellows, MD;  Location: [facility] ENDOSCOPY;  Service: Gastroenterology;  Laterality: N/A;  . HIP FRACTURE SURGERY  January 2013   R hip  . HIP FRACTURE SURGERY Right 02/2016  . NASAL FRACTURE SURGERY    . ROBOTIC ASSISTED LAPAROSCOPIC CHOLECYSTECTOMY-MULTI SITE N/A 03/17/2018   Procedure: ROBOTIC ASSISTED LAPAROSCOPIC CHOLECYSTECTOMY-MULTI SITE;  Surgeon: Jules Husbands, MD;  Location: [facility] ORS;  Service: General;  Laterality: N/A;    Family History  Problem Relation Age of Onset  . Prostate cancer Neg Hx   . Bladder Cancer Neg Hx   . Kidney cancer Neg Hx   No bleeding disorders, clotting disorders, autoimmune diseases or aneurysms  Social History      Social History  Substance Use Topics  . Smoking status: Former Smoker    Quit date: 04/14/2007  . Smokeless tobacco: Never Used  . Alcohol use No  No IVDU      Allergies  Allergen Reactions  . Ibrutinib Nausea Only          Current Outpatient Prescriptions  Medication Sig Dispense Refill  . ibuprofen (ADVIL,MOTRIN) 800 MG tablet Take 1 tablet (800 mg total) by mouth every 8 (eight) hours as needed. 28 tablet 0  . pantoprazole (PROTONIX) 40 MG tablet Take 40 mg by mouth daily.    Marland Kitchen  PARoxetine (PAXIL) 40 MG tablet Take 40 mg by mouth every morning.    . tamsulosin (FLOMAX) 0.4 MG CAPS capsule Take 1 capsule (0.4 mg total) by mouth daily. 30 capsule 0   No current facility-administered medications for this visit.      REVIEW OF SYSTEMS(Negative unless checked)  Constitutional: [] ?Weight loss[] ?Fever[] ?Chills Cardiac:[] ?Chest pain[] ?Chest pressure[] ?Palpitations [] ?Shortness of breath when laying flat [] ?Shortness of breath at rest [] ?Shortness of breath with exertion. Vascular: [] ?Pain  in legs with walking[] ?Pain in legsat rest[] ?Pain in legs when laying flat [] ?Claudication [] ?Pain in feet when walking [] ?Pain in feet at rest [] ?Pain in feet when laying flat [] ?History of DVT [] ?Phlebitis [] ?Swelling in legs [] ?Varicose veins [] ?Non-healing ulcers Pulmonary: [] ?Uses home oxygen [] ?Productive cough[] ?Hemoptysis [] ?Wheeze [] ?COPD [] ?Asthma Neurologic: [] ?Dizziness [] ?Blackouts [] ?Seizures [] ?History of stroke [] ?History of TIA[] ?Aphasia [] ?Temporary blindness[] ?Dysphagia [] ?Weaknessor numbness in arms [] ?Weakness or numbnessin legs Musculoskeletal: [x] ?Arthritis [] ?Joint swelling [] ?Joint pain [] ?Low back pain Hematologic:[] ?Easy bruising[] ?Easy bleeding [] ?Hypercoagulable state [x] ?Anemic [] ?Hepatitis Gastrointestinal:[] ?Blood in stool[] ?Vomiting blood[x] ?Gastroesophageal reflux/heartburn[x] ?Abdominal pain Genitourinary: [] ?Chronic kidney disease [x] ?Difficulturination [] ?Frequenturination [] ?Burning with urination[] ?Hematuria Skin: [] ?Rashes [] ?Ulcers [] ?Wounds Psychological: [x] ?History of anxiety[x] ?History of major depression.    Physical Examination  BP 109/68 (BP Location: Right Arm)   Pulse 74   Resp 16   Ht 5\' 11"  (1.803 m)   Wt 151 lb 12.8 oz (68.9 kg)   BMI 21.17 kg/m  Gen:  WD/WN, NAD Head: [place]/AT, No temporalis wasting. Ear/Nose/Throat: Hearing grossly intact, nares w/o erythema or drainage Eyes: Conjunctiva clear. Sclera non-icteric Neck: Supple.  Trachea midline Pulmonary:  Good air movement, no use of accessory muscles.  Cardiac: RRR, no JVD Vascular:  Vessel Right Left  Radial Palpable Palpable                          PT Palpable Palpable  DP Palpable Palpable   Gastrointestinal: soft, non-tender/non-distended. No guarding/reflex.  Musculoskeletal: M/S 5/5 throughout.  No deformity or atrophy. No edema. Neurologic: Sensation grossly intact  in extremities.  Symmetrical.  Speech is fluent.  Psychiatric: Judgment intact, Mood & affect appropriate for pt's clinical situation. Dermatologic: No rashes or ulcers noted.  No cellulitis or open wounds.       Labs Recent Results (from the past 2160 hour(s))  IGM     Status: Abnormal   Collection Time: 09/22/18  9:48 AM  Result Value Ref Range   IgM (Immunoglobulin M), Srm 416 (H) 20 - 172 mg/dL    Comment: (NOTE) Performed At: [facility] [place], [place] [id number] Rush Cange MD [id number]   Comprehensive metabolic panel     Status: Abnormal   Collection Time: 09/22/18  9:48 AM  Result Value Ref Range   Sodium 137 135 - 145 mmol/L   Potassium 4.3 3.5 - 5.1 mmol/L   Chloride 103 98 - 111 mmol/L   CO2 25 22 - 32 mmol/L   Glucose, Bld 100 (H) 70 - 99 mg/dL   BUN 33 (H) 8 - 23 mg/dL   Creatinine, Ser 1.14 0.61 - 1.24 mg/dL   Calcium 8.9 8.9 - 10.3 mg/dL   Total Protein 7.2 6.5 - 8.1 g/dL   Albumin 4.2 3.5 - 5.0 g/dL   AST 23 15 - 41 U/L   ALT 19 0 - 44 U/L   Alkaline Phosphatase 95 38 - 126 U/L   Total Bilirubin 1.2 0.3 - 1.2 mg/dL   GFR calc non Af Amer >60 >60 mL/min   GFR calc Af Amer >60 >60  mL/min   Anion gap 9 5 - 15    Comment: Performed at [facility], [address] [place], [place] [address]  CBC with Differential/Platelet     Status: None   Collection Time: 09/22/18  9:48 AM  Result Value Ref Range   WBC 5.4 4.0 - 10.5 K/uL   RBC 4.43 4.22 - 5.81 MIL/uL   Hemoglobin 13.4 13.0 - 17.0 g/dL   HCT 39.9 39.0 - 52.0 %   MCV 90.1 80.0 - 100.0 fL   MCH 30.2 26.0 - 34.0 pg   MCHC 33.6 30.0 - 36.0 g/dL   RDW 14.1 11.5 - 15.5 %   Platelets 360 150 - 400 K/uL   nRBC 0.0 0.0 - 0.2 %   Neutrophils Relative % 54 %   Neutro Abs 2.9 1.7 - 7.7 K/uL   Lymphocytes Relative 33 %   Lymphs Abs 1.8 0.7 - 4.0 K/uL   Monocytes Relative 10 %   Monocytes Absolute 0.5 0.1 - 1.0 K/uL   Eosinophils Relative 2 %   Eosinophils Absolute  0.1 0.0 - 0.5 K/uL   Basophils Relative 1 %   Basophils Absolute 0.0 0.0 - 0.1 K/uL   Immature Granulocytes 0 %   Abs Immature Granulocytes 0.01 0.00 - 0.07 K/uL    Comment: Performed at [facility], [address], [place], [place] [address]    Radiology No results found.  Assessment/Plan  Aortic atherosclerosis (HCC) No significant claudication symptoms.  AAA (abdominal aortic aneurysm) without rupture (HCC) His aortic duplex today shows a stable 3.0 cm abdominal aortic aneurysm without significant progression from previous study. Stable small aneurysm.  We will continue annual duplex surveillance at this time.  Discussed the importance of tobacco avoidance and blood pressure control both of which he is doing well.    Leotis Pain, MD  09/23/2018 9:43 AM    This note was created with Dragon medical transcription system.  Any errors from dictation are purely unintentional

## 2018-09-23 NOTE — Assessment & Plan Note (Signed)
No significant claudication symptoms.

## 2018-09-23 NOTE — Assessment & Plan Note (Signed)
His aortic duplex today shows a stable 3.0 cm abdominal aortic aneurysm without significant progression from previous study. Stable small aneurysm.  We will continue annual duplex surveillance at this time.  Discussed the importance of tobacco avoidance and blood pressure control both of which he is doing well.

## 2018-09-26 LAB — MULTIPLE MYELOMA PANEL, SERUM
Albumin SerPl Elph-Mcnc: 3.7 g/dL (ref 2.9–4.4)
Albumin/Glob SerPl: 1.2 (ref 0.7–1.7)
Alpha 1: 0.2 g/dL (ref 0.0–0.4)
Alpha2 Glob SerPl Elph-Mcnc: 0.8 g/dL (ref 0.4–1.0)
B-Globulin SerPl Elph-Mcnc: 1.1 g/dL (ref 0.7–1.3)
Gamma Glob SerPl Elph-Mcnc: 1 g/dL (ref 0.4–1.8)
Globulin, Total: 3.2 g/dL (ref 2.2–3.9)
IgA: 313 mg/dL (ref 61–437)
IgG (Immunoglobin G), Serum: 751 mg/dL (ref 603–1613)
IgM (Immunoglobulin M), Srm: 440 mg/dL — ABNORMAL HIGH (ref 20–172)
M Protein SerPl Elph-Mcnc: 0.3 g/dL — ABNORMAL HIGH
Total Protein ELP: 6.9 g/dL (ref 6.0–8.5)

## 2019-03-24 ENCOUNTER — Encounter: Payer: Self-pay | Admitting: Oncology

## 2019-03-24 ENCOUNTER — Inpatient Hospital Stay (HOSPITAL_BASED_OUTPATIENT_CLINIC_OR_DEPARTMENT_OTHER): Payer: Medicare Other | Admitting: Oncology

## 2019-03-24 ENCOUNTER — Inpatient Hospital Stay: Payer: Medicare Other | Attending: Oncology

## 2019-03-24 ENCOUNTER — Other Ambulatory Visit: Payer: Self-pay

## 2019-03-24 VITALS — BP 118/72 | HR 74 | Temp 98.7°F | Ht 71.0 in | Wt 156.0 lb

## 2019-03-24 DIAGNOSIS — M545 Low back pain: Secondary | ICD-10-CM | POA: Diagnosis not present

## 2019-03-24 DIAGNOSIS — C880 Waldenstrom macroglobulinemia: Secondary | ICD-10-CM | POA: Diagnosis not present

## 2019-03-24 DIAGNOSIS — R109 Unspecified abdominal pain: Secondary | ICD-10-CM | POA: Diagnosis not present

## 2019-03-24 DIAGNOSIS — Z87891 Personal history of nicotine dependence: Secondary | ICD-10-CM | POA: Diagnosis not present

## 2019-03-24 DIAGNOSIS — F419 Anxiety disorder, unspecified: Secondary | ICD-10-CM | POA: Insufficient documentation

## 2019-03-24 DIAGNOSIS — Z79899 Other long term (current) drug therapy: Secondary | ICD-10-CM | POA: Insufficient documentation

## 2019-03-24 DIAGNOSIS — R5383 Other fatigue: Secondary | ICD-10-CM | POA: Insufficient documentation

## 2019-03-24 DIAGNOSIS — F329 Major depressive disorder, single episode, unspecified: Secondary | ICD-10-CM | POA: Diagnosis not present

## 2019-03-24 DIAGNOSIS — K219 Gastro-esophageal reflux disease without esophagitis: Secondary | ICD-10-CM | POA: Diagnosis not present

## 2019-03-24 LAB — COMPREHENSIVE METABOLIC PANEL
ALT: 16 U/L (ref 0–44)
AST: 22 U/L (ref 15–41)
Albumin: 4.2 g/dL (ref 3.5–5.0)
Alkaline Phosphatase: 82 U/L (ref 38–126)
Anion gap: 9 (ref 5–15)
BUN: 21 mg/dL (ref 8–23)
CO2: 27 mmol/L (ref 22–32)
Calcium: 9.3 mg/dL (ref 8.9–10.3)
Chloride: 106 mmol/L (ref 98–111)
Creatinine, Ser: 0.97 mg/dL (ref 0.61–1.24)
GFR calc Af Amer: >60 mL/min (ref >60)
GFR calc non Af Amer: >60 mL/min (ref >60)
Glucose, Bld: 98 mg/dL (ref 70–99)
Potassium: 4.9 mmol/L (ref 3.5–5.1)
Sodium: 142 mmol/L (ref 135–145)
Total Bilirubin: 1.8 mg/dL — ABNORMAL HIGH (ref 0.3–1.2)
Total Protein: 7.4 g/dL (ref 6.5–8.1)

## 2019-03-24 LAB — CBC WITH DIFFERENTIAL/PLATELET
Abs Immature Granulocytes: 0.01 10*3/uL (ref 0.00–0.07)
Basophils Absolute: 0 10*3/uL (ref 0.0–0.1)
Basophils Relative: 1 %
Eosinophils Absolute: 0 10*3/uL (ref 0.0–0.5)
Eosinophils Relative: 1 %
HCT: 40.2 % (ref 39.0–52.0)
Hemoglobin: 13.1 g/dL (ref 13.0–17.0)
Immature Granulocytes: 0 %
Lymphocytes Relative: 29 %
Lymphs Abs: 1.5 10*3/uL (ref 0.7–4.0)
MCH: 29.8 pg (ref 26.0–34.0)
MCHC: 32.6 g/dL (ref 30.0–36.0)
MCV: 91.6 fL (ref 80.0–100.0)
Monocytes Absolute: 0.5 10*3/uL (ref 0.1–1.0)
Monocytes Relative: 10 %
Neutro Abs: 3.2 10*3/uL (ref 1.7–7.7)
Neutrophils Relative %: 59 %
Platelets: 341 10*3/uL (ref 150–400)
RBC: 4.39 MIL/uL (ref 4.22–5.81)
RDW: 13.4 % (ref 11.5–15.5)
WBC: 5.4 10*3/uL (ref 4.0–10.5)
nRBC: 0 % (ref 0.0–0.2)

## 2019-03-24 NOTE — Progress Notes (Signed)
Patient stated that he had been doing well except for the tingling and burning from his legs and feet.

## 2019-03-27 NOTE — Progress Notes (Signed)
Hematology/Oncology Consult note [facility]  Telephone:([phone] Fax:([phone]  Patient Care Team: [place] Pink, MD as PCP - General (Family Medicine) Clarene Essex, MD (Gastroenterology) Vladimir Crofts, MD as Consulting Physician (Neurology) Melvyn Novas, MD as Referring Physician (Orthopedic Surgery) Marval Regal, NP (Inactive) as Nurse Practitioner (Nurse Practitioner) Hollice Espy, MD as Consulting Physician (Urology) Lucky Cowboy Erskine Squibb, MD as Referring Physician (Vascular Surgery)   Name of the patient: [name]  [id number]  Feb 15, 1951   Date of visit: 03/27/19  Diagnosis-Walden Strom's macroglobulinemia  Chief complaint/ Reason for visit-routine follow-up of Waldenstrm's macroglobulinemia  Heme/Onc history:  Patient is a 68 year old male with a history of Walden Strom's macroglobulinemia and was seeing Dr. [name] previously and is transferring his care to me.  He was initially diagnosed with [name] macroglobulinemia in 2012 when he presented with headache and visual changes.  He received weekly Rituxan x4 followed by maintenance Rituxan for 2 years until March 2014.  He had recurrence of dizziness and vertigo in June 2015 and underwent plasmapheresis at [facility] with improvement of symptoms and he was retreated with Rituxan x4.  He completed this treatment in July 2015.  He was started on maintenance ibrutinib in November 2015 which he did not tolerate.  Maintenance Rituxan was started thereafter and stopped in November 2016.  His IgM levels have been between 3000-5000 when he is symptomatic and presently in the 400s.  Patient also has chronic abdominal pain and CT scans have been unrevealing.   Interval history-patient currently feels well.  He has mild fatigue which is unchanged as well as chronic abdominal pain.  Reports low back pain radiating down his legs and occasional burning sensation in his feet.  Appetite and weight  are stable.  ECOG PS- 1 Pain scale- 3  Review of systems- Review of Systems  Constitutional: Positive for malaise/fatigue. Negative for chills, fever and weight loss.  HENT: Negative for congestion, ear discharge and nosebleeds.   Eyes: Negative for blurred vision.  Respiratory: Negative for cough, hemoptysis, sputum production, shortness of breath and wheezing.   Cardiovascular: Negative for chest pain, palpitations, orthopnea and claudication.  Gastrointestinal: Negative for abdominal pain, blood in stool, constipation, diarrhea, heartburn, melena, nausea and vomiting.  Genitourinary: Negative for dysuria, flank pain, frequency, hematuria and urgency.  Musculoskeletal: Positive for back pain. Negative for joint pain and myalgias.  Skin: Negative for rash.  Neurological: Positive for sensory change (Tingling numbness and burning in his lower extremities). Negative for dizziness, tingling, focal weakness, seizures, weakness and headaches.  Endo/Heme/Allergies: Does not bruise/bleed easily.  Psychiatric/Behavioral: Negative for depression and suicidal ideas. The patient does not have insomnia.        Allergies  Allergen Reactions  . Ibrutinib Nausea Only     Past Medical History:  Diagnosis Date  . Anemia    prev bone marrow bx 02/11/10  . Anxiety   . Bone cancer ([place])   . Colitis 2004  . Colon polyps   . Depression   . Dysphagia   . GERD (gastroesophageal reflux disease)   . Need for prophylactic vaccination and inoculation against influenza 01/31/2013  . Neuropathy   . Sleep apnea 09-12-10   Uses CPAP  . 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[facility])      Past Surgical History:  Procedure Laterality Date  . COLONOSCOPY  03/17/2011   Procedure: COLONOSCOPY;  Surgeon: Jeryl Columbia, MD;  Location: [facility] ENDOSCOPY;  Service: Endoscopy;  Laterality: N/A;  . COLONOSCOPY  WITH PROPOFOL N/A 10/07/2016   Procedure: COLONOSCOPY WITH PROPOFOL;  Surgeon: Manya Silvas, MD;  Location:  [facility] ENDOSCOPY;  Service: Endoscopy;  Laterality: N/A;  . ESOPHAGOGASTRODUODENOSCOPY (EGD) WITH PROPOFOL N/A 10/07/2016   Procedure: ESOPHAGOGASTRODUODENOSCOPY (EGD) WITH PROPOFOL;  Surgeon: Manya Silvas, MD;  Location: [facility] ENDOSCOPY;  Service: Endoscopy;  Laterality: N/A;  . ESOPHAGOGASTRODUODENOSCOPY (EGD) WITH PROPOFOL N/A 01/24/2018   Procedure: ESOPHAGOGASTRODUODENOSCOPY (EGD) WITH PROPOFOL with Gastric Mapping;  Surgeon: Jonathon Bellows, MD;  Location: [facility] ENDOSCOPY;  Service: Gastroenterology;  Laterality: N/A;  . HIP FRACTURE SURGERY  January 2013   R hip  . HIP FRACTURE SURGERY Right 02/2016  . NASAL FRACTURE SURGERY    . ROBOTIC ASSISTED LAPAROSCOPIC CHOLECYSTECTOMY-MULTI SITE N/A 03/17/2018   Procedure: ROBOTIC ASSISTED LAPAROSCOPIC CHOLECYSTECTOMY-MULTI SITE;  Surgeon: Jules Husbands, MD;  Location: [facility] ORS;  Service: General;  Laterality: N/A;    Social History   Socioeconomic History  . Marital status: Married    Spouse name: Not on file  . Number of children: Not on file  . Years of education: Not on file  . Highest education level: Not on file  Occupational History  . Not on file  Tobacco Use  . Smoking status: Former Smoker    Quit date: 04/14/2007    Years since quitting: 11.9  . Smokeless tobacco: Never Used  Substance and Sexual Activity  . Alcohol use: No  . Drug use: No  . Sexual activity: Not on file  Other Topics Concern  . Not on file  Social History Narrative  . Not on file   Social Determinants of Health   Financial Resource Strain:   . Difficulty of Paying Living Expenses: Not on file  Food Insecurity:   . Worried About Charity fundraiser in the Last Year: Not on file  . Ran Out of Food in the Last Year: Not on file  Transportation Needs:   . Lack of Transportation (Medical): Not on file  . Lack of Transportation (Non-Medical): Not on file  Physical Activity:   . Days of Exercise per Week: Not on file  . Minutes of Exercise per Session:  Not on file  Stress:   . Feeling of Stress : Not on file  Social Connections:   . Frequency of Communication with Friends and Family: Not on file  . Frequency of Social Gatherings with Friends and Family: Not on file  . Attends Religious Services: Not on file  . Active Member of Clubs or Organizations: Not on file  . Attends Archivist Meetings: Not on file  . Marital Status: Not on file  Intimate Partner Violence:   . Fear of Current or Ex-Partner: Not on file  . Emotionally Abused: Not on file  . Physically Abused: Not on file  . Sexually Abused: Not on file    Family History  Problem Relation Age of Onset  . Prostate cancer Neg Hx   . Bladder Cancer Neg Hx   . Kidney cancer Neg Hx      Current Outpatient Medications:  .  acetaminophen (TYLENOL) 500 MG tablet, Take 500 mg by mouth every 6 (six) hours as needed., Disp: , Rfl:  .  pantoprazole (PROTONIX) 40 MG tablet, Take 40 mg by mouth daily. , Disp: , Rfl:  .  PARoxetine (PAXIL) 40 MG tablet, Take 40 mg by mouth every morning., Disp: , Rfl:   Physical exam:  Vitals:   03/24/19 1122  BP: 118/72  Pulse: 74  Temp: 98.7 F (37.1 C)  TempSrc: Tympanic  Weight: 156 lb (70.8 kg)  Height: 5\' 11"  (1.803 m)   Physical Exam HENT:     Head: Normocephalic and atraumatic.  Eyes:     Pupils: Pupils are equal, round, and reactive to light.  Cardiovascular:     Rate and Rhythm: Normal rate and regular rhythm.     Heart sounds: Normal heart sounds.  Pulmonary:     Effort: Pulmonary effort is normal.     Breath sounds: Normal breath sounds.  Abdominal:     General: Bowel sounds are normal.     Palpations: Abdomen is soft.  Musculoskeletal:     Cervical back: Normal range of motion.  Skin:    General: Skin is warm and dry.  Neurological:     Mental Status: He is alert and oriented to person, place, and time.      CMP Latest Ref Rng & Units 03/24/2019  Glucose 70 - 99 mg/dL 98  BUN 8 - 23 mg/dL 21   Creatinine 0.61 - 1.24 mg/dL 0.97  Sodium 135 - 145 mmol/L 142  Potassium 3.5 - 5.1 mmol/L 4.9  Chloride 98 - 111 mmol/L 106  CO2 22 - 32 mmol/L 27  Calcium 8.9 - 10.3 mg/dL 9.3  Total Protein 6.5 - 8.1 g/dL 7.4  Total Bilirubin 0.3 - 1.2 mg/dL 1.8(H)  Alkaline Phos 38 - 126 U/L 82  AST 15 - 41 U/L 22  ALT 0 - 44 U/L 16   CBC Latest Ref Rng & Units 03/24/2019  WBC 4.0 - 10.5 K/uL 5.4  Hemoglobin 13.0 - 17.0 g/dL 13.1  Hematocrit 39.0 - 52.0 % 40.2  Platelets 150 - 400 K/uL 341      Assessment and plan- Patient is a 68 y.o. male with history of Walden Strom's macroglobulinemia currently in remission here for routine follow-up.  Patient CBC is within normal limits.  CMP showed elevated bilirubin of 1.8 with a normal AST ALT and alkaline phosphatase.  He has not had elevated bilirubin in the past.  I will repeat his CMP in 1 month's time.  Myeloma panel is currently pending.Labs from 6 months ago showed mildly elevated IgM levels of 440 with an M protein of 0.3.  I will see him back in 6 months with CBC with differential, CMP and myeloma panel.  Clinically patient is doing well and does not have any symptoms of recurrence.   Visit Diagnosis 1. 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[place])      Dr. Randa Evens, MD, MPH [facility] at [facility] [id number] 03/27/2019 10:04 AM

## 2019-03-29 LAB — MULTIPLE MYELOMA PANEL, SERUM
Albumin SerPl Elph-Mcnc: 4.1 g/dL (ref 2.9–4.4)
Albumin/Glob SerPl: 1.6 (ref 0.7–1.7)
Alpha 1: 0.1 g/dL (ref 0.0–0.4)
Alpha2 Glob SerPl Elph-Mcnc: 0.6 g/dL (ref 0.4–1.0)
B-Globulin SerPl Elph-Mcnc: 1 g/dL (ref 0.7–1.3)
Gamma Glob SerPl Elph-Mcnc: 0.8 g/dL (ref 0.4–1.8)
Globulin, Total: 2.6 g/dL (ref 2.2–3.9)
IgA: 291 mg/dL (ref 61–437)
IgG (Immunoglobin G), Serum: 717 mg/dL (ref 603–1613)
IgM (Immunoglobulin M), Srm: 415 mg/dL — ABNORMAL HIGH (ref 20–172)
M Protein SerPl Elph-Mcnc: 0.3 g/dL — ABNORMAL HIGH
Total Protein ELP: 6.7 g/dL (ref 6.0–8.5)

## 2019-04-24 ENCOUNTER — Inpatient Hospital Stay: Payer: Medicare Other | Attending: Oncology

## 2019-04-24 ENCOUNTER — Other Ambulatory Visit: Payer: Self-pay

## 2019-04-24 DIAGNOSIS — D890 Polyclonal hypergammaglobulinemia: Secondary | ICD-10-CM | POA: Insufficient documentation

## 2019-04-24 DIAGNOSIS — C880 Waldenstrom macroglobulinemia: Secondary | ICD-10-CM

## 2019-04-24 LAB — COMPREHENSIVE METABOLIC PANEL
ALT: 14 U/L (ref 0–44)
AST: 21 U/L (ref 15–41)
Albumin: 4.2 g/dL (ref 3.5–5.0)
Alkaline Phosphatase: 86 U/L (ref 38–126)
Anion gap: 10 (ref 5–15)
BUN: 19 mg/dL (ref 8–23)
CO2: 26 mmol/L (ref 22–32)
Calcium: 9.1 mg/dL (ref 8.9–10.3)
Chloride: 103 mmol/L (ref 98–111)
Creatinine, Ser: 0.97 mg/dL (ref 0.61–1.24)
GFR calc Af Amer: >60 mL/min (ref >60)
GFR calc non Af Amer: >60 mL/min (ref >60)
Glucose, Bld: 104 mg/dL — ABNORMAL HIGH (ref 70–99)
Potassium: 4.6 mmol/L (ref 3.5–5.1)
Sodium: 139 mmol/L (ref 135–145)
Total Bilirubin: 1.1 mg/dL (ref 0.3–1.2)
Total Protein: 7.3 g/dL (ref 6.5–8.1)

## 2019-09-22 ENCOUNTER — Inpatient Hospital Stay (HOSPITAL_BASED_OUTPATIENT_CLINIC_OR_DEPARTMENT_OTHER): Payer: Medicare Other | Admitting: Oncology

## 2019-09-22 ENCOUNTER — Encounter: Payer: Self-pay | Admitting: Oncology

## 2019-09-22 ENCOUNTER — Inpatient Hospital Stay: Payer: Medicare Other | Attending: Oncology

## 2019-09-22 ENCOUNTER — Other Ambulatory Visit: Payer: Self-pay

## 2019-09-22 VITALS — BP 115/75 | HR 71 | Temp 96.3°F | Resp 16 | Wt 154.8 lb

## 2019-09-22 DIAGNOSIS — Z87891 Personal history of nicotine dependence: Secondary | ICD-10-CM | POA: Diagnosis not present

## 2019-09-22 DIAGNOSIS — C880 Waldenstrom macroglobulinemia: Secondary | ICD-10-CM | POA: Diagnosis not present

## 2019-09-22 LAB — COMPREHENSIVE METABOLIC PANEL
ALT: 13 U/L (ref 0–44)
AST: 20 U/L (ref 15–41)
Albumin: 3.9 g/dL (ref 3.5–5.0)
Alkaline Phosphatase: 97 U/L (ref 38–126)
Anion gap: 9 (ref 5–15)
BUN: 22 mg/dL (ref 8–23)
CO2: 27 mmol/L (ref 22–32)
Calcium: 9.1 mg/dL (ref 8.9–10.3)
Chloride: 104 mmol/L (ref 98–111)
Creatinine, Ser: 0.96 mg/dL (ref 0.61–1.24)
GFR calc Af Amer: >60 mL/min (ref >60)
GFR calc non Af Amer: >60 mL/min (ref >60)
Glucose, Bld: 106 mg/dL — ABNORMAL HIGH (ref 70–99)
Potassium: 4.7 mmol/L (ref 3.5–5.1)
Sodium: 140 mmol/L (ref 135–145)
Total Bilirubin: 1 mg/dL (ref 0.3–1.2)
Total Protein: 7.2 g/dL (ref 6.5–8.1)

## 2019-09-22 LAB — CBC WITH DIFFERENTIAL/PLATELET
Abs Immature Granulocytes: 0.01 10*3/uL (ref 0.00–0.07)
Basophils Absolute: 0 10*3/uL (ref 0.0–0.1)
Basophils Relative: 1 %
Eosinophils Absolute: 0.1 10*3/uL (ref 0.0–0.5)
Eosinophils Relative: 1 %
HCT: 39.2 % (ref 39.0–52.0)
Hemoglobin: 13.1 g/dL (ref 13.0–17.0)
Immature Granulocytes: 0 %
Lymphocytes Relative: 27 %
Lymphs Abs: 1.4 10*3/uL (ref 0.7–4.0)
MCH: 29.6 pg (ref 26.0–34.0)
MCHC: 33.4 g/dL (ref 30.0–36.0)
MCV: 88.7 fL (ref 80.0–100.0)
Monocytes Absolute: 0.5 10*3/uL (ref 0.1–1.0)
Monocytes Relative: 9 %
Neutro Abs: 3.3 10*3/uL (ref 1.7–7.7)
Neutrophils Relative %: 62 %
Platelets: 325 10*3/uL (ref 150–400)
RBC: 4.42 MIL/uL (ref 4.22–5.81)
RDW: 13.8 % (ref 11.5–15.5)
WBC: 5.2 10*3/uL (ref 4.0–10.5)
nRBC: 0 % (ref 0.0–0.2)

## 2019-09-24 NOTE — Progress Notes (Signed)
Hematology/Oncology Consult note [facility]  [phone] [phone]  Patient Care Team: Maryland Pink, MD as PCP - General (Family Medicine) Clarene Essex, MD (Gastroenterology) Vladimir Crofts, MD as Consulting Physician (Neurology) Melvyn Novas, MD as Referring Physician (Orthopedic Surgery) Marval Regal, NP as Nurse Practitioner (Nurse Practitioner) Hollice Espy, MD as Consulting Physician (Urology) Lucky Cowboy Erskine Squibb, MD as Referring Physician (Vascular Surgery)   Name of the patient: [name]  [id number]  05/31/1950   Date of visit: 09/24/19  Diagnosis- h/o [name] macroglobulinemia   Chief complaint/ Reason for visit-routine follow-up for history of Waldenstrm's macroglobulinemia  Heme/Onc history: Patient is a 69 year old male with a history of Walden Strom's macroglobulinemia and was seeing Dr. Mike Gip previously and is transferring his care to me. He was initially diagnosed with [name] macroglobulinemia in 2012 when he presented with headache and visual changes. He received weekly Rituxan x4 followed by maintenance Rituxan for 2 years until March 2014. He had recurrence of dizziness and vertigo in June 2015 and underwent plasmapheresis at [facility] with improvement of symptoms and he was retreated with Rituxan x4. He completed this treatment in July 2015. He was started on maintenance ibrutinib in November 2015 which he did not tolerate. Maintenance Rituxan was started thereafter and stopped in November 2016. His IgM levels have been between 3000-5000 when he is symptomatic and presently in the 400s. Patient also has chronic abdominal pain and CT scans have been unrevealing.    Interval history-currently patient reports doing well.  Appetite and weight have remained stable.  Patient denies any B symptoms such as fatigue, drenching night sweats.  No history of headaches changes in vision or  dizziness.  ECOG PS- 1 Pain scale- 0   Review of systems- Review of Systems  Constitutional: Negative for chills, fever, malaise/fatigue and weight loss.  HENT: Negative for congestion, ear discharge and nosebleeds.   Eyes: Negative for blurred vision.  Respiratory: Negative for cough, hemoptysis, sputum production, shortness of breath and wheezing.   Cardiovascular: Negative for chest pain, palpitations, orthopnea and claudication.  Gastrointestinal: Negative for abdominal pain, blood in stool, constipation, diarrhea, heartburn, melena, nausea and vomiting.  Genitourinary: Negative for dysuria, flank pain, frequency, hematuria and urgency.  Musculoskeletal: Negative for back pain, joint pain and myalgias.  Skin: Negative for rash.  Neurological: Negative for dizziness, tingling, focal weakness, seizures, weakness and headaches.  Endo/Heme/Allergies: Does not bruise/bleed easily.  Psychiatric/Behavioral: Negative for depression and suicidal ideas. The patient does not have insomnia.      Allergies  Allergen Reactions  . Ibrutinib Nausea Only     Past Medical History:  Diagnosis Date  . Anemia    prev bone marrow bx 02/11/10  . Anxiety   . Bone cancer ([place])   . Colitis 2004  . Colon polyps   . Depression   . Dysphagia   . GERD (gastroesophageal reflux disease)   . Need for prophylactic vaccination and inoculation against influenza 01/31/2013  . Neuropathy   . Sleep apnea 09-12-10   Uses CPAP  . 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[facility])      Past Surgical History:  Procedure Laterality Date  . COLONOSCOPY  03/17/2011   Procedure: COLONOSCOPY;  Surgeon: Jeryl Columbia, MD;  Location: [facility] ENDOSCOPY;  Service: Endoscopy;  Laterality: N/A;  . COLONOSCOPY WITH PROPOFOL N/A 10/07/2016   Procedure: COLONOSCOPY WITH PROPOFOL;  Surgeon: Manya Silvas, MD;  Location: [facility] ENDOSCOPY;  Service: Endoscopy;  Laterality: N/A;  .  ESOPHAGOGASTRODUODENOSCOPY (EGD) WITH PROPOFOL N/A  10/07/2016   Procedure: ESOPHAGOGASTRODUODENOSCOPY (EGD) WITH PROPOFOL;  Surgeon: Manya Silvas, MD;  Location: [facility] ENDOSCOPY;  Service: Endoscopy;  Laterality: N/A;  . ESOPHAGOGASTRODUODENOSCOPY (EGD) WITH PROPOFOL N/A 01/24/2018   Procedure: ESOPHAGOGASTRODUODENOSCOPY (EGD) WITH PROPOFOL with Gastric Mapping;  Surgeon: Jonathon Bellows, MD;  Location: [facility] ENDOSCOPY;  Service: Gastroenterology;  Laterality: N/A;  . HIP FRACTURE SURGERY  January 2013   R hip  . HIP FRACTURE SURGERY Right 02/2016  . NASAL FRACTURE SURGERY    . ROBOTIC ASSISTED LAPAROSCOPIC CHOLECYSTECTOMY-MULTI SITE N/A 03/17/2018   Procedure: ROBOTIC ASSISTED LAPAROSCOPIC CHOLECYSTECTOMY-MULTI SITE;  Surgeon: Jules Husbands, MD;  Location: [facility] ORS;  Service: General;  Laterality: N/A;    Social History   Socioeconomic History  . Marital status: Married    Spouse name: Not on file  . Number of children: Not on file  . Years of education: Not on file  . Highest education level: Not on file  Occupational History  . Not on file  Tobacco Use  . Smoking status: Former Smoker    Quit date: 04/14/2007    Years since quitting: 12.4  . Smokeless tobacco: Never Used  Substance and Sexual Activity  . Alcohol use: No  . Drug use: No  . Sexual activity: Not on file  Other Topics Concern  . Not on file  Social History Narrative  . Not on file   Social Determinants of Health   Financial Resource Strain:   . Difficulty of Paying Living Expenses:   Food Insecurity:   . Worried About Charity fundraiser in the Last Year:   . Arboriculturist in the Last Year:   Transportation Needs:   . Film/video editor (Medical):   Marland Kitchen Lack of Transportation (Non-Medical):   Physical Activity:   . Days of Exercise per Week:   . Minutes of Exercise per Session:   Stress:   . Feeling of Stress :   Social Connections:   . Frequency of Communication with Friends and Family:   . Frequency of Social Gatherings with Friends and Family:    . Attends Religious Services:   . Active Member of Clubs or Organizations:   . Attends Archivist Meetings:   Marland Kitchen Marital Status:   Intimate Partner Violence:   . Fear of Current or Ex-Partner:   . Emotionally Abused:   Marland Kitchen Physically Abused:   . Sexually Abused:     Family History  Problem Relation Age of Onset  . Prostate cancer Neg Hx   . Bladder Cancer Neg Hx   . Kidney cancer Neg Hx      Current Outpatient Medications:  .  acetaminophen (TYLENOL) 500 MG tablet, Take 500 mg by mouth every 6 (six) hours as needed., Disp: , Rfl:  .  pantoprazole (PROTONIX) 40 MG tablet, Take 40 mg by mouth daily. , Disp: , Rfl:  .  PARoxetine (PAXIL) 40 MG tablet, Take 40 mg by mouth every morning., Disp: , Rfl:   Physical exam:  Vitals:   09/22/19 0926  BP: 115/75  Pulse: 71  Resp: 16  Temp: (!) 96.3 F (35.7 C)  TempSrc: Tympanic  SpO2: 100%  Weight: 154 lb 12.8 oz (70.2 kg)   Physical Exam Constitutional:      General: He is not in acute distress. Cardiovascular:     Rate and Rhythm: Normal rate and regular rhythm.     Heart sounds: Normal heart sounds.  Pulmonary:  Effort: Pulmonary effort is normal.     Breath sounds: Normal breath sounds.  Abdominal:     General: Bowel sounds are normal.     Palpations: Abdomen is soft.     Comments: No palpable hepatosplenomegaly  Lymphadenopathy:     Comments: No palpable cervical, supraclavicular, axillary or inguinal adenopathy   Skin:    General: Skin is warm and dry.  Neurological:     Mental Status: He is alert and oriented to person, place, and time.      CMP Latest Ref Rng & Units 09/22/2019  Glucose 70 - 99 mg/dL 106(H)  BUN 8 - 23 mg/dL 22  Creatinine 0.61 - 1.24 mg/dL 0.96  Sodium 135 - 145 mmol/L 140  Potassium 3.5 - 5.1 mmol/L 4.7  Chloride 98 - 111 mmol/L 104  CO2 22 - 32 mmol/L 27  Calcium 8.9 - 10.3 mg/dL 9.1  Total Protein 6.5 - 8.1 g/dL 7.2  Total Bilirubin 0.3 - 1.2 mg/dL 1.0  Alkaline Phos 38  - 126 U/L 97  AST 15 - 41 U/L 20  ALT 0 - 44 U/L 13   CBC Latest Ref Rng & Units 09/22/2019  WBC 4.0 - 10.5 K/uL 5.2  Hemoglobin 13.0 - 17.0 g/dL 13.1  Hematocrit 39 - 52 % 39.2  Platelets 150 - 400 K/uL 325    Assessment and plan- Patient is a 69 y.o. male with history of Waldenstrm's macroglobulinemia here for routine follow-up  Patient is clinically doing well and no concerning signs of hyperviscosity or B symptoms.  CBC is within normal limits and stable CMP.  Myeloma panel is currently pending.  We have been checking his labs every 6 months and his prior iron panel from December 2020 revealed a stable IgM which has been mildly elevated at 415 which is his baseline.  When he has episodes of Walden Strom's flare his IgM levels  increased to 2 g.  During such times he has required pheresis.  Patient will continue to remain under surveillance and I will see him back in 6 months with CBC with differential CMP and myeloma panel   Visit Diagnosis 1. 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[place])      Dr. Randa Evens, MD, MPH [facility] at [facility] [phone] 09/24/2019 7:50 AM

## 2019-09-26 ENCOUNTER — Other Ambulatory Visit: Payer: Self-pay

## 2019-09-26 ENCOUNTER — Ambulatory Visit (INDEPENDENT_AMBULATORY_CARE_PROVIDER_SITE_OTHER): Payer: Medicare Other | Admitting: Vascular Surgery

## 2019-09-26 ENCOUNTER — Encounter (INDEPENDENT_AMBULATORY_CARE_PROVIDER_SITE_OTHER): Payer: Self-pay | Admitting: Vascular Surgery

## 2019-09-26 ENCOUNTER — Ambulatory Visit (INDEPENDENT_AMBULATORY_CARE_PROVIDER_SITE_OTHER): Payer: Medicare Other

## 2019-09-26 VITALS — BP 116/68 | HR 78 | Resp 16 | Wt 151.8 lb

## 2019-09-26 DIAGNOSIS — I700 Atherosclerosis of aorta: Secondary | ICD-10-CM

## 2019-09-26 DIAGNOSIS — I714 Abdominal aortic aneurysm, without rupture, unspecified: Secondary | ICD-10-CM

## 2019-09-26 LAB — MULTIPLE MYELOMA PANEL, SERUM
Albumin SerPl Elph-Mcnc: 3.7 g/dL (ref 2.9–4.4)
Albumin/Glob SerPl: 1.3 (ref 0.7–1.7)
Alpha 1: 0.2 g/dL (ref 0.0–0.4)
Alpha2 Glob SerPl Elph-Mcnc: 0.7 g/dL (ref 0.4–1.0)
B-Globulin SerPl Elph-Mcnc: 1.1 g/dL (ref 0.7–1.3)
Gamma Glob SerPl Elph-Mcnc: 1 g/dL (ref 0.4–1.8)
Globulin, Total: 3 g/dL (ref 2.2–3.9)
IgA: 308 mg/dL (ref 61–437)
IgG (Immunoglobin G), Serum: 758 mg/dL (ref 603–1613)
IgM (Immunoglobulin M), Srm: 430 mg/dL — ABNORMAL HIGH (ref 20–172)
M Protein SerPl Elph-Mcnc: 0.4 g/dL — ABNORMAL HIGH
Total Protein ELP: 6.7 g/dL (ref 6.0–8.5)

## 2019-09-26 NOTE — Patient Instructions (Signed)
Abdominal Aortic Aneurysm  An aneurysm is a bulge in one of the blood vessels that carry blood away from the heart (artery). It happens when blood pushes up against a weak or damaged place in the wall of an artery. An abdominal aortic aneurysm happens in the main artery of the body (aorta). Some aneurysms may not cause problems. If it grows, it can burst or tear, causing bleeding inside the body. This is an emergency. It needs to be treated right away. What are the causes? The exact cause of this condition is not known. What increases the risk? The following may make you more likely to get this condition:  Being a male who is 60 years of age or older.  Being white (Caucasian).  Using tobacco.  Having a family history of aneurysms.  Having the following conditions: ? Hardening of the arteries (arteriosclerosis). ? Inflammation of the walls of an artery (arteritis). ? Certain genetic conditions. ? Being very overweight (obesity). ? An infection in the wall of the aorta (infectious aortitis). ? High cholesterol. ? High blood pressure (hypertension). What are the signs or symptoms? Symptoms depend on the size of the aneurysm and how fast it is growing. Most grow slowly and do not cause any symptoms. If symptoms do occur, they may include:  Pain in the belly (abdomen), side, or back.  Feeling full after eating only small amounts of food.  Feeling a throbbing lump in the belly. Symptoms that the aneurysm has burst (ruptured) include:  Sudden, very bad pain in the belly, side, or back.  Feeling sick to your stomach (nauseous).  Throwing up (vomiting).  Feeling light-headed or passing out. How is this treated? Treatment for this condition depends on:  The size of the aneurysm.  How fast it is growing.  Your age.  Your risk of having it burst. If your aneurysm is smaller than 2 inches (5 cm), your doctor may manage it by:  Checking it often to see if it is getting bigger.  You may have an imaging test (ultrasound) to check it every 3-6 months, every year, or every few years.  Giving you medicines to: ? Control blood pressure. ? Treat pain. ? Fight infection. If your aneurysm is larger than 2 inches (5 cm), you may need surgery to fix it. Follow these instructions at home: Lifestyle  Do not use any products that have nicotine or tobacco in them. This includes cigarettes, e-cigarettes, and chewing tobacco. If you need help quitting, ask your doctor.  Get regular exercise. Ask your doctor what types of exercise are best for you. Eating and drinking  Eat a heart-healthy diet. This includes eating plenty of: ? Fresh fruits and vegetables. ? Whole grains. ? Low-fat (lean) protein. ? Low-fat dairy products.  Avoid foods that are high in saturated fat and cholesterol. These foods include red meat and some dairy products.  Do not drink alcohol if: ? Your doctor tells you not to drink. ? You are pregnant, may be pregnant, or are planning to become pregnant.  If you drink alcohol: ? Limit how much you use to:  0-1 drink a day for women.  0-2 drinks a day for men. ? Be aware of how much alcohol is in your drink. In the [place] one drink equals any of these:  One typical bottle of beer (12 oz).  One-half glass of wine (5 oz).  One shot of hard liquor (1 oz). General instructions  Take over-the-counter and prescription medicines only as   told by your doctor.  Keep your blood pressure within normal limits. Ask your doctor what your blood pressure should be.  Have your blood sugar (glucose) level and cholesterol levels checked regularly. Keep your blood sugar level and cholesterol levels within normal limits.  Avoid heavy lifting and activities that take a lot of effort. Ask your doctor what activities are safe for you.  Keep all follow-up visits as told by your doctor. This is important. ? Talk to your doctor about regular screenings to see if the  aneurysm is getting bigger. Contact a doctor if you:  Have pain in your belly, side, or back.  Have a throbbing feeling in your belly.  Have a family history of aneurysms. Get help right away if you:  Have sudden, bad pain in your belly, side, or back.  Feel sick to your stomach.  Throw up.  Have trouble pooping (constipation).  Have trouble peeing (urinating).  Feel light-headed.  Have a fast heart rate when you stand.  Have sweaty skin that is cold to the touch (clammy).  Have shortness of breath.  Have a fever. These symptoms may be an emergency. Do not wait to see if the symptoms will go away. Get medical help right away. Call your local emergency services (911 in the [place] Do not drive yourself to the hospital. Summary  An aneurysm is a bulge in one of the blood vessels that carry blood away from the heart (artery). Some aneurysms may not cause problems.  You may need to have yours checked often. If it grows, it can burst or tear. This causes bleeding inside the body. It needs to be treated right away.  Follow instructions from your doctor about healthy lifestyle changes.  Keep all follow-up visits as told by your doctor. This is important. This information is not intended to replace advice given to you by your health care provider. Make sure you discuss any questions you have with your health care provider. Document Revised: 07/18/2018 Document Reviewed: 11/06/2017 Elsevier Patient Education  2020 [employer] Inc.  

## 2019-09-26 NOTE — Assessment & Plan Note (Signed)
His AAA measures 3.1 cm in maximal diameter today. No major changes. Recheck in one year.

## 2019-09-26 NOTE — Progress Notes (Signed)
MRN : [id number]  [name] is a 69 y.o. (07-21-1950) male who presents with chief complaint of  Chief Complaint  Patient presents with   Follow-up    ultrasound follow up  .  History of Present Illness: Patient returns today in follow up of his AAA. He is doing well. He has no aneurysm related symptoms. Specifically, the patient denies new back or abdominal pain, or signs of peripheral embolization. His AAA measures 3.1 cm in maximal diameter today.   Current Outpatient Medications  Medication Sig Dispense Refill   acetaminophen (TYLENOL) 500 MG tablet Take 500 mg by mouth every 6 (six) hours as needed.     pantoprazole (PROTONIX) 40 MG tablet Take 40 mg by mouth daily.      PARoxetine (PAXIL) 40 MG tablet Take 40 mg by mouth every morning.     No current facility-administered medications for this visit.    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[facility])     Past Surgical History:  Procedure Laterality Date   COLONOSCOPY  03/17/2011   Procedure: COLONOSCOPY;  Surgeon: Jeryl Columbia, MD;  Location: [facility] ENDOSCOPY;  Service: Endoscopy;  Laterality: N/A;   COLONOSCOPY WITH PROPOFOL N/A 10/07/2016   Procedure: COLONOSCOPY WITH PROPOFOL;  Surgeon: Manya Silvas, MD;  Location: [facility] ENDOSCOPY;  Service: Endoscopy;  Laterality: N/A;   ESOPHAGOGASTRODUODENOSCOPY (EGD) WITH PROPOFOL N/A 10/07/2016   Procedure: ESOPHAGOGASTRODUODENOSCOPY (EGD) WITH PROPOFOL;  Surgeon: Manya Silvas, MD;  Location: [facility] ENDOSCOPY;  Service: Endoscopy;  Laterality: N/A;   ESOPHAGOGASTRODUODENOSCOPY (EGD) WITH PROPOFOL N/A 01/24/2018    Procedure: ESOPHAGOGASTRODUODENOSCOPY (EGD) WITH PROPOFOL with Gastric Mapping;  Surgeon: Jonathon Bellows, MD;  Location: [facility] ENDOSCOPY;  Service: Gastroenterology;  Laterality: N/A;   HIP FRACTURE SURGERY  January 2013   R hip   HIP FRACTURE SURGERY Right 02/2016   NASAL FRACTURE SURGERY     ROBOTIC ASSISTED LAPAROSCOPIC CHOLECYSTECTOMY-MULTI SITE N/A 03/17/2018   Procedure: ROBOTIC ASSISTED LAPAROSCOPIC CHOLECYSTECTOMY-MULTI SITE;  Surgeon: Jules Husbands, MD;  Location: [facility] ORS;  Service: General;  Laterality: N/A;     Social History   Tobacco Use   Smoking status: Former Smoker    Quit date: 04/14/2007    Years since quitting: 12.4   Smokeless tobacco: Never Used  Substance Use Topics   Alcohol use: No   Drug use: No      Family History  Problem Relation Age of Onset   Prostate cancer Neg Hx    Bladder Cancer Neg Hx    Kidney cancer Neg Hx   no bleeding or clotting disorders  Allergies  Allergen Reactions   Ibrutinib Nausea Only     REVIEW OF SYSTEMS(Negative unless checked)  Constitutional: [] ??Weight loss[] ??Fever[] ??Chills Cardiac:[] ??Chest pain[] ??Chest pressure[] ??Palpitations [] ??Shortness of breath when laying flat [] ??Shortness of breath at rest [] ??Shortness of breath with exertion. Vascular: [] ??Pain in legs with walking[] ??Pain in legsat rest[] ??Pain in legs when laying flat [] ??Claudication [] ??Pain in feet when walking [] ??Pain in feet at rest [] ??Pain in feet when laying flat [] ??History of DVT [] ??Phlebitis [] ??Swelling in legs [] ??Varicose veins [] ??  Non-healing ulcers Pulmonary: [] ??Uses home oxygen [] ??Productive cough[] ??Hemoptysis [] ??Wheeze [] ??COPD [] ??Asthma Neurologic: [] ??Dizziness [] ??Blackouts [] ??Seizures [] ??History of stroke [] ??History of TIA[] ??Aphasia [] ??Temporary blindness[] ??Dysphagia [] ??Weaknessor numbness in arms [] ??Weakness or numbnessin  legs Musculoskeletal: [x] ??Arthritis [] ??Joint swelling [] ??Joint pain [] ??Low back pain Hematologic:[] ??Easy bruising[] ??Easy bleeding [] ??Hypercoagulable state [x] ??Anemic [] ??Hepatitis Gastrointestinal:[] ??Blood in stool[] ??Vomiting blood[x] ??Gastroesophageal reflux/heartburn[x] ??Abdominal pain Genitourinary: [] ??Chronic kidney disease [x] ??Difficulturination [] ??Frequenturination [] ??Burning with urination[] ??Hematuria Skin: [] ??Rashes [] ??Ulcers [] ??Wounds Psychological: [x] ??History of anxiety[x] ??History of major depression.  Physical Examination  BP 116/68 (BP Location: Left Arm)    Pulse 78    Resp 16    Wt 151 lb 12.8 oz (68.9 kg)    BMI 21.17 kg/m  Gen:  WD/WN, NAD Head: [place]/AT, No temporalis wasting. Ear/Nose/Throat: Hearing grossly intact, nares w/o erythema or drainage Eyes: Conjunctiva clear. Sclera non-icteric Neck: Supple.  Trachea midline Pulmonary:  Good air movement, no use of accessory muscles.  Cardiac: RRR, no JVD Vascular:  Vessel Right Left  Radial Palpable Palpable                   Gastrointestinal: soft, non-tender/non-distended. No guarding/reflex.  Musculoskeletal: M/S 5/5 throughout.  No deformity or atrophy. No edema. Neurologic: Sensation grossly intact in extremities.  Symmetrical.  Speech is fluent.  Psychiatric: Judgment intact, Mood & affect appropriate for pt's clinical situation.        Labs Recent Results (from the past 2160 hour(s))  CMP arch     Status: Abnormal   Collection Time: 09/22/19  9:02 AM  Result Value Ref Range   Sodium 140 135 - 145 mmol/L   Potassium 4.7 3.5 - 5.1 mmol/L   Chloride 104 98 - 111 mmol/L   CO2 27 22 - 32 mmol/L   Glucose, Bld 106 (H) 70 - 99 mg/dL    Comment: Glucose reference range applies only to samples taken after fasting for at least 8 hours.   BUN 22 8 - 23 mg/dL   Creatinine, Ser 0.96 0.61 - 1.24 mg/dL   Calcium 9.1 8.9 - 10.3 mg/dL   Total  Protein 7.2 6.5 - 8.1 g/dL   Albumin 3.9 3.5 - 5.0 g/dL   AST 20 15 - 41 U/L   ALT 13 0 - 44 U/L   Alkaline Phosphatase 97 38 - 126 U/L   Total Bilirubin 1.0 0.3 - 1.2 mg/dL   GFR calc non Af Amer >60 >60 mL/min   GFR calc Af Amer >60 >60 mL/min   Anion gap 9 5 - 15    Comment: Performed at [facility], [address] [place], [place] [address]  CBC diff arch     Status: None   Collection Time: 09/22/19  9:02 AM  Result Value Ref Range   WBC 5.2 4.0 - 10.5 K/uL   RBC 4.42 4.22 - 5.81 MIL/uL   Hemoglobin 13.1 13.0 - 17.0 g/dL   HCT 39.2 39 - 52 %   MCV 88.7 80.0 - 100.0 fL   MCH 29.6 26.0 - 34.0 pg   MCHC 33.4 30.0 - 36.0 g/dL   RDW 13.8 11.5 - 15.5 %   Platelets 325 150 - 400 K/uL   nRBC 0.0 0.0 - 0.2 %   Neutrophils Relative % 62 %   Neutro Abs 3.3 1.7 - 7.7 K/uL   Lymphocytes Relative 27 %   Lymphs Abs 1.4 0.7 - 4.0 K/uL   Monocytes Relative 9 %   Monocytes Absolute 0.5 0 - 1 K/uL   Eosinophils Relative 1 %   Eosinophils Absolute 0.1  0 - 0 K/uL   Basophils Relative 1 %   Basophils Absolute 0.0 0 - 0 K/uL   Immature Granulocytes 0 %   Abs Immature Granulocytes 0.01 0.00 - 0.07 K/uL    Comment: Performed at [facility], [address], [place], [place] [address]    Radiology No results found.  Assessment/Plan  Aortic atherosclerosis (HCC) No claudication symptoms or stenosis  AAA (abdominal aortic aneurysm) without rupture (HCC) His AAA measures 3.1 cm in maximal diameter today. No major changes. Recheck in one year.    Leotis Pain, MD  09/26/2019 10:24 AM    This note was created with Dragon medical transcription system.  Any errors from dictation are purely unintentional

## 2019-09-26 NOTE — Assessment & Plan Note (Signed)
No claudication symptoms or stenosis

## 2019-12-11 ENCOUNTER — Ambulatory Visit (INDEPENDENT_AMBULATORY_CARE_PROVIDER_SITE_OTHER): Payer: Medicare Other | Admitting: Internal Medicine

## 2019-12-11 DIAGNOSIS — C8800 Waldenstrom macroglobulinemia not having achieved remission: Secondary | ICD-10-CM

## 2019-12-11 DIAGNOSIS — G4733 Obstructive sleep apnea (adult) (pediatric): Secondary | ICD-10-CM

## 2019-12-11 DIAGNOSIS — Z9989 Dependence on other enabling machines and devices: Secondary | ICD-10-CM | POA: Diagnosis not present

## 2019-12-11 NOTE — Patient Instructions (Signed)

## 2019-12-11 NOTE — Progress Notes (Signed)
[facility] [address] [place], [place] [address]  Pulmonary Sleep Medicine   Office Visit Note  Patient Name: [name] DOB: 01/27/1951 MRN [id number]    Chief Complaint: Obstructive Sleep Apnea visit  Brief History:  [name] is seen today for follow up. He recently received a replacement CPAP. The patient has a 9 year history of sleep apnea. Patient is using PAP nightly.  The patient feels more rested after sleeping with PAP.  He sleeps from 10 p.m until 7-7:30 a,m.The patient reports benefiting from PAP use. Reported sleepiness is  improving and the Epworth Sleepiness Score is 6 out of 24. The patient does occasionally. take naps. The patient complains of the following: no complaints. The compliance download shows excellent compliance with an average use time of 8.4 hours. The AHI is 3.2.Mask leak is elevated  The patient denies of limb movements disrupting sleep.  ROS  General: (-) fever, (-) chills, (-) night sweat Nose and Sinuses: (-) nasal stuffiness or itchiness, (-) postnasal drip, (-) nosebleeds, (-) sinus trouble. Mouth and Throat: (-) sore throat, (-) hoarseness. Neck: (-) swollen glands, (-) enlarged thyroid, (-) neck pain. Respiratory: - cough, - shortness of breath, - wheezing. Neurologic: - numbness, - tingling. Psychiatric: + anxiety, - depression   Current Medication: Outpatient Encounter Medications as of 12/11/2019  Medication Sig  . acetaminophen (TYLENOL) 500 MG tablet Take 500 mg by mouth every 6 (six) hours as needed.  . pantoprazole (PROTONIX) 40 MG tablet Take 40 mg by mouth daily.   . PARoxetine (PAXIL) 40 MG tablet Take 40 mg by mouth every morning.   No facility-administered encounter medications on file as of 12/11/2019.    Surgical History: Past Surgical History:  Procedure Laterality Date  . COLONOSCOPY  03/17/2011   Procedure: COLONOSCOPY;  Surgeon: Marc E Magod, MD;  Location: [facility] ENDOSCOPY;  Service: Endoscopy;   Laterality: N/A;  . COLONOSCOPY WITH PROPOFOL N/A 10/07/2016   Procedure: COLONOSCOPY WITH PROPOFOL;  Surgeon: Elliott, Robert T, MD;  Location: [facility] ENDOSCOPY;  Service: Endoscopy;  Laterality: N/A;  . ESOPHAGOGASTRODUODENOSCOPY (EGD) WITH PROPOFOL N/A 10/07/2016   Procedure: ESOPHAGOGASTRODUODENOSCOPY (EGD) WITH PROPOFOL;  Surgeon: Elliott, Robert T, MD;  Location: [facility] ENDOSCOPY;  Service: Endoscopy;  Laterality: N/A;  . ESOPHAGOGASTRODUODENOSCOPY (EGD) WITH PROPOFOL N/A 01/24/2018   Procedure: ESOPHAGOGASTRODUODENOSCOPY (EGD) WITH PROPOFOL with Gastric Mapping;  Surgeon: Anna, Kiran, MD;  Location: [facility] ENDOSCOPY;  Service: Gastroenterology;  Laterality: N/A;  . HIP FRACTURE SURGERY  January 2013   R hip  . HIP FRACTURE SURGERY Right 02/2016  . NASAL FRACTURE SURGERY    . ROBOTIC ASSISTED LAPAROSCOPIC CHOLECYSTECTOMY-MULTI SITE N/A 03/17/2018   Procedure: ROBOTIC ASSISTED LAPAROSCOPIC CHOLECYSTECTOMY-MULTI SITE;  Surgeon: Pabon, Diego F, MD;  Location: [facility] ORS;  Service: General;  Laterality: N/A;    Medical History: Past Medical History:  Diagnosis Date  . Anemia    prev bone marrow bx 02/11/10  . Anxiety   . Bone cancer (HCC)   . Colitis 2004  . Colon polyps   . Depression   . Dysphagia   . GERD (gastroesophageal reflux disease)   . Need for prophylactic vaccination and inoculation against influenza 01/31/2013  . Neuropathy   . Sleep apnea 09-12-10   Uses CPAP  . 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HCC)     Family History: Non contributory to the present illness  Social History: Social History   Socioeconomic History  . Marital status: Married    Spouse name: Not on file  . Number   of children: Not on file  . Years of education: Not on file  . Highest education level: Not on file  Occupational History  . Not on file  Tobacco Use  . Smoking status: Former Smoker    Quit date: 04/14/2007    Years since quitting: 12.6  . Smokeless tobacco: Never Used  Substance and  Sexual Activity  . Alcohol use: No  . Drug use: No  . Sexual activity: Not on file  Other Topics Concern  . Not on file  Social History Narrative  . Not on file   Social Determinants of Health   Financial Resource Strain:   . Difficulty of Paying Living Expenses: Not on file  Food Insecurity:   . Worried About Running Out of Food in the Last Year: Not on file  . Ran Out of Food in the Last Year: Not on file  Transportation Needs:   . Lack of Transportation (Medical): Not on file  . Lack of Transportation (Non-Medical): Not on file  Physical Activity:   . Days of Exercise per Week: Not on file  . Minutes of Exercise per Session: Not on file  Stress:   . Feeling of Stress : Not on file  Social Connections:   . Frequency of Communication with Friends and Family: Not on file  . Frequency of Social Gatherings with Friends and Family: Not on file  . Attends Religious Services: Not on file  . Active Member of Clubs or Organizations: Not on file  . Attends Club or Organization Meetings: Not on file  . Marital Status: Not on file  Intimate Partner Violence:   . Fear of Current or Ex-Partner: Not on file  . Emotionally Abused: Not on file  . Physically Abused: Not on file  . Sexually Abused: Not on file    Vital Signs: Blood pressure 117/78, pulse 94, height 5' 11" (1.803 m), weight 150 lb (68 kg), SpO2 98 %.  Examination: General Appearance: The patient is well-developed, well-nourished, and in no distress. Neck Circumference: 37 Skin: Gross inspection of skin unremarkable. Head: normocephalic, no gross deformities. Eyes: no gross deformities noted. ENT: ears appear grossly normal Neurologic: Alert and oriented. No involuntary movements.    EPWORTH SLEEPINESS SCALE:  Scale:  (0)= no chance of dozing; (1)= slight chance of dozing; (2)= moderate chance of dozing; (3)= high chance of dozing  Chance  Situtation    Sitting and reading: 1    Watching TV: 1    Sitting  Inactive in public: 1    As a passenger in car: 0      Lying down to rest: 2    Sitting and talking: 0    Sitting quielty after lunch: 1    In a car, stopped in traffic: 0   TOTAL SCORE:  6 out of 24    SLEEP STUDIES:  1. PSG 09/2010  AHI 16 SpO2min 82%   CPAP COMPLIANCE DATA:  Date Range: 11/07/19-11/05/19  Average Daily Use: 8.4 hours  Median Use: 8.3  Compliance for > 4 Hours: 100 %  AHI: 3.2 respiratory events per hour  Days Used: 30/30  Mask Leak: 56.8  95th Percentile Pressure: 9  LABS: Recent Results (from the past 2160 hour(s))  Multiple Myeloma Panel (SPEP&IFE w/QIG)     Status: Abnormal   Collection Time: 09/22/19  9:02 AM  Result Value Ref Range   IgG (Immunoglobin G), Serum 758 603 - 1,613 mg/dL   IgA 308 61 - 437 mg/dL     IgM (Immunoglobulin M), Srm 430 (H) 20 - 172 mg/dL   Total Protein ELP 6.7 6.0 - 8.5 g/dL   Albumin SerPl Elph-Mcnc 3.7 2.9 - 4.4 g/dL   Alpha 1 0.2 0.0 - 0.4 g/dL   Alpha2 Glob SerPl Elph-Mcnc 0.7 0.4 - 1.0 g/dL   B-Globulin SerPl Elph-Mcnc 1.1 0.7 - 1.3 g/dL   Gamma Glob SerPl Elph-Mcnc 1.0 0.4 - 1.8 g/dL   M Protein SerPl Elph-Mcnc 0.4 (H) Not Observed g/dL   Globulin, Total 3.0 2.2 - 3.9 g/dL   Albumin/Glob SerPl 1.3 0.7 - 1.7   IFE 1 Comment (A)     Comment: (NOTE) 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    Please Note Comment     Comment: (NOTE) Protein electrophoresis scan will follow via computer, mail, or courier delivery. Performed At: [facility] [address] [place], [place] [id number] Nagendra Sanjai MD Ph:[phone]   CMP arch     Status: Abnormal   Collection Time: 09/22/19  9:02 AM  Result Value Ref Range   Sodium 140 135 - 145 mmol/L   Potassium 4.7 3.5 - 5.1 mmol/L   Chloride 104 98 - 111 mmol/L   CO2 27 22 - 32 mmol/L   Glucose, Bld 106 (H) 70 - 99 mg/dL    Comment: Glucose reference range applies only to  samples taken after fasting for at least 8 hours.   BUN 22 8 - 23 mg/dL   Creatinine, Ser 0.96 0.61 - 1.24 mg/dL   Calcium 9.1 8.9 - 10.3 mg/dL   Total Protein 7.2 6.5 - 8.1 g/dL   Albumin 3.9 3.5 - 5.0 g/dL   AST 20 15 - 41 U/L   ALT 13 0 - 44 U/L   Alkaline Phosphatase 97 38 - 126 U/L   Total Bilirubin 1.0 0.3 - 1.2 mg/dL   GFR calc non Af Amer >60 >60 mL/min   GFR calc Af Amer >60 >60 mL/min   Anion gap 9 5 - 15    Comment: Performed at [facility], [address] [place], [place] [address]  CBC diff arch     Status: None   Collection Time: 09/22/19  9:02 AM  Result Value Ref Range   WBC 5.2 4.0 - 10.5 K/uL   RBC 4.42 4.22 - 5.81 MIL/uL   Hemoglobin 13.1 13.0 - 17.0 g/dL   HCT 39.2 39 - 52 %   MCV 88.7 80.0 - 100.0 fL   MCH 29.6 26.0 - 34.0 pg   MCHC 33.4 30.0 - 36.0 g/dL   RDW 13.8 11.5 - 15.5 %   Platelets 325 150 - 400 K/uL   nRBC 0.0 0.0 - 0.2 %   Neutrophils Relative % 62 %   Neutro Abs 3.3 1.7 - 7.7 K/uL   Lymphocytes Relative 27 %   Lymphs Abs 1.4 0.7 - 4.0 K/uL   Monocytes Relative 9 %   Monocytes Absolute 0.5 0 - 1 K/uL   Eosinophils Relative 1 %   Eosinophils Absolute 0.1 0 - 0 K/uL   Basophils Relative 1 %   Basophils Absolute 0.0 0 - 0 K/uL   Immature Granulocytes 0 %   Abs Immature Granulocytes 0.01 0.00 - 0.07 K/uL    Comment: Performed at [facility], [address] [place], [place] [address]    Radiology: No results found.   Assessment and Plan: Patient Active Problem List   Diagnosis Date   Noted  . Biliary dyskinesia   . Right hip pain 07/10/2017  . Influenza vaccine administered 01/25/2017  . AAA (abdominal aortic aneurysm) without rupture ([place]) 01/13/2017  . Prostatitis 01/12/2017  . Aortic atherosclerosis ([place]) 01/12/2017  . Urinary dysfunction 12/11/2016  . Testicular asymmetry 12/11/2016  . Enteritis 12/11/2016  . Diarrhea, functional 12/11/2016  . Abnormal CT scan, gastrointestinal tract 12/11/2016  . Abdominal  pain, diffuse 12/11/2016  . Erosive gastritis 12/03/2016  . Bilateral lower abdominal pain 12/03/2016  . History of colonic polyps 07/27/2016  . Abnormal CAT scan 09/17/2013  . Abnormal CT scan, chest 09/17/2013  . Need for prophylactic vaccination and inoculation against influenza 01/31/2013  . 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[place])   . GERD (gastroesophageal reflux disease)       The patient 1 tolerate PAP and reports 0 benefit from PAP use. The patient was reminded to continue good care of his machine . The patient has lost 17 lbs in about a year. He should follow up with his oncologist. The compliance is excellent. The apnea is very well controlled.   1. OSA - continue with excellent compliance. 2. Waldenstrom's macroglobulinemia-Last treatments in 2016.Concerned about his weight loss. Since his last visit he has lost another 5 pounds. He was seen by his oncologist, Dr. Janese Banks in June. He has a hearty appetite and is also concerned about his weight loss as he has been trying to keep his weight up. Encouraged him to reach out to Dr. Janese Banks to discuss his ongoing weight loss.  General Counseling: I have discussed the findings of the evaluation and examination with Gwyndolyn Saxon.  I have also discussed any further diagnostic evaluation thatmay be needed or ordered today. Arlando verbalizes understanding of the findings of todays visit. We also reviewed his medications today and discussed drug interactions and side effects including but not limited excessive drowsiness and altered mental states. We also discussed that there is always a risk not just to him but also people around him. he has been encouraged to call the office with any questions or concerns that should arise related to todays visit.     This patient was seen by Theodoro Grist AGNP-C in Collaboration with Dr. Devona Konig as a part of collaborative care agreement.   I have personally obtained a history, examined the patient, evaluated  laboratory and imaging results, formulated the assessment and plan and placed orders.   Richelle Ito Saunders Glance, PhD, FAASM  Diplomate, American Board of Sleep Medicine    Allyne Gee, MD [facility] Diplomate ABMS Pulmonary and Critical Care Medicine Sleep medicine

## 2020-01-05 IMAGING — CT CT CHEST W/ CM
2 of 5 series · 12 of 36 positions shown, 15 images · IV contrast (iopamidol)
Comparison: CT scan 12/03/2016

CLINICAL DATA: Unintentional weight loss. Nonlocalized abdominal
pain common nausea and night sweats. History of Waldenstrom's
macroglobulinemia.

EXAM:
CT CHEST, ABDOMEN, AND PELVIS WITH CONTRAST
TECHNIQUE: Multidetector CT imaging of the chest, abdomen and pelvis was
performed following the standard protocol during bolus
administration of intravenous contrast.
CONTRAST:  85mL [id number] IOPAMIDOL ([id number]) INJECTION 61%

[Series 2: axials cap · axial · 0.74mm/px · z∈[-1564,-1009]mm · 9 of 139 slices shown, 12 images]
[im 14/139  mediastinal]
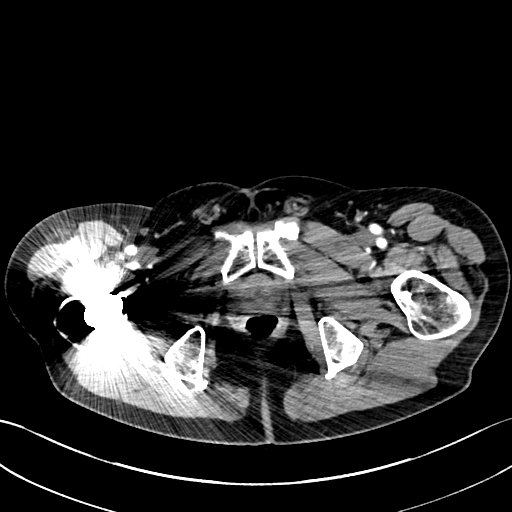
[im 14/139  lung]
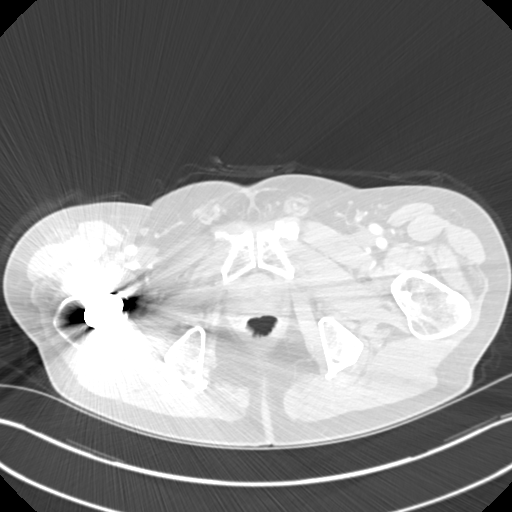
[im 28/139  lung]
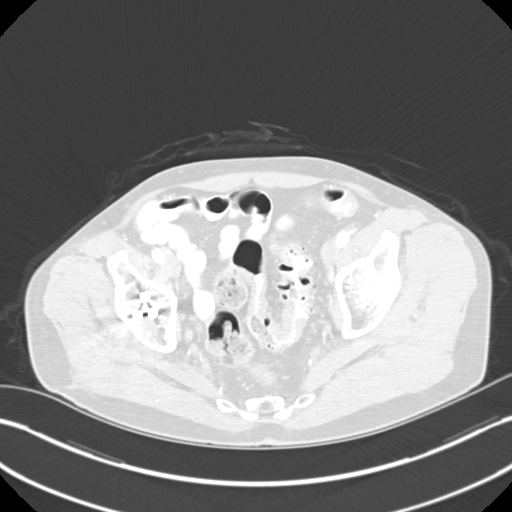
[im 42/139  lung]
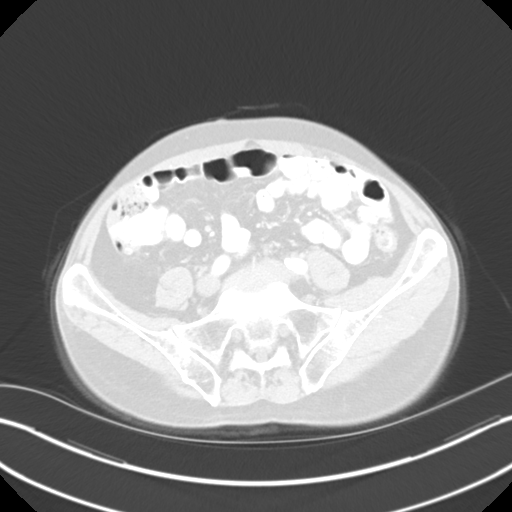
[im 56/139  lung]
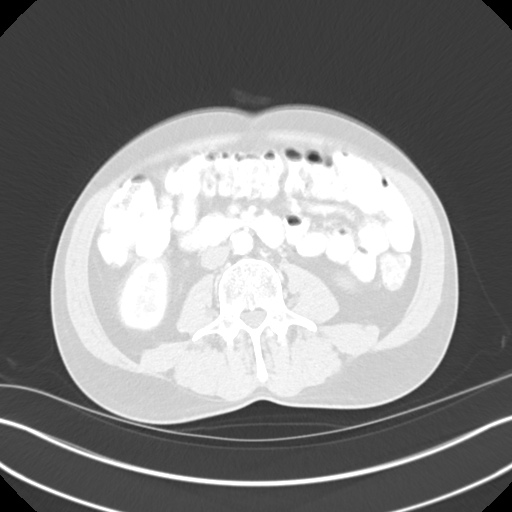
[im 70/139  mediastinal]
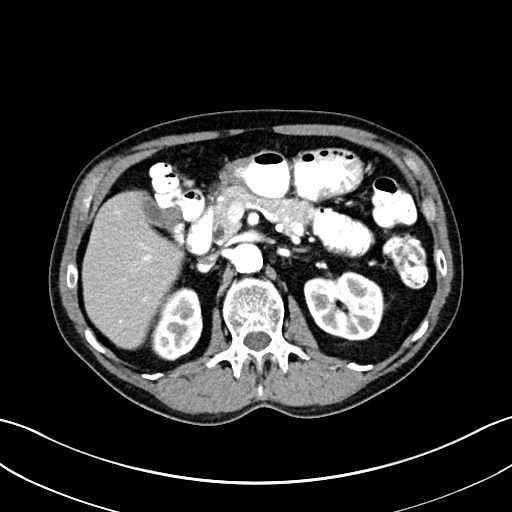
[im 70/139  lung]
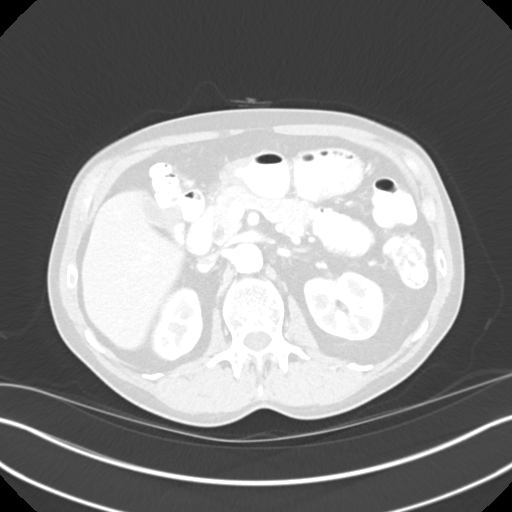
[im 83/139  lung]
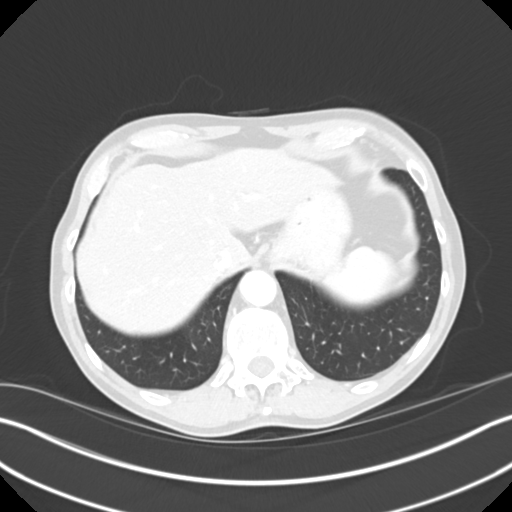
[im 97/139  lung]
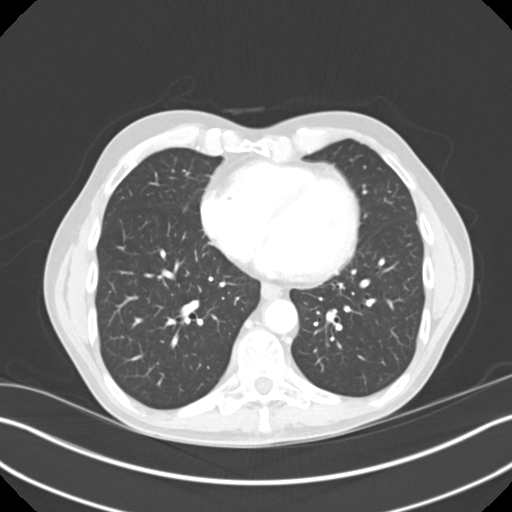
[im 111/139  lung]
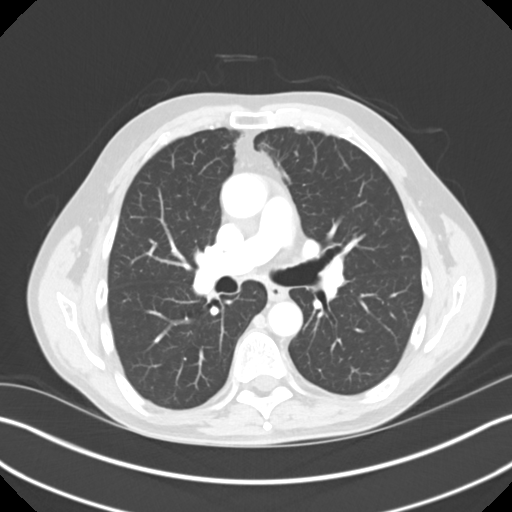
[im 125/139  mediastinal]
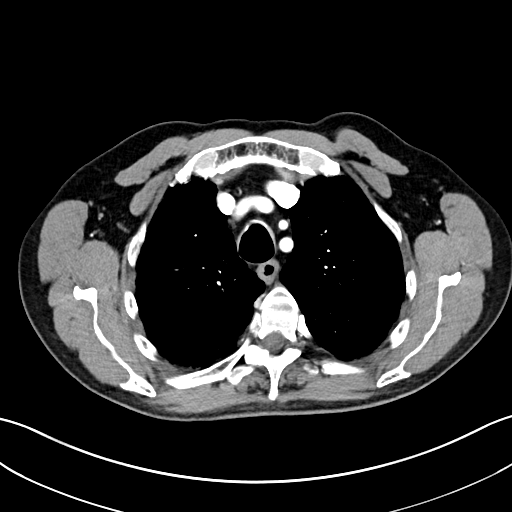
[im 125/139  lung]
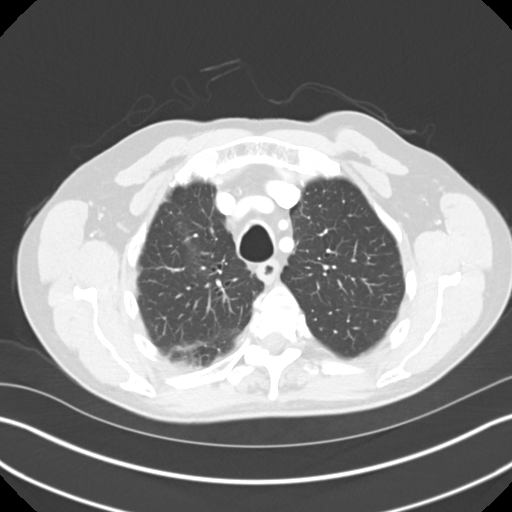

[Series 4: coronals cap · coronal · 0.74mm/px · 3 of 133 slices shown]
[im 27/133  lung]
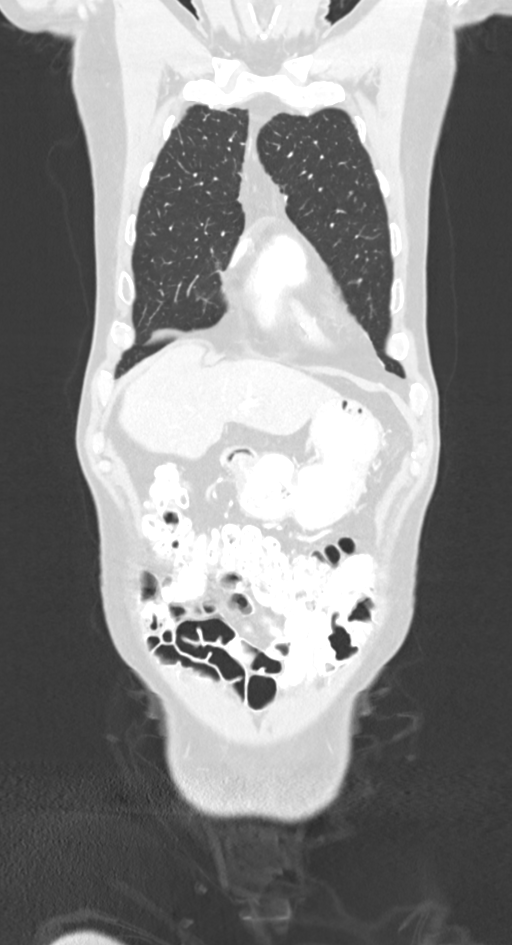
[im 53/133  lung]
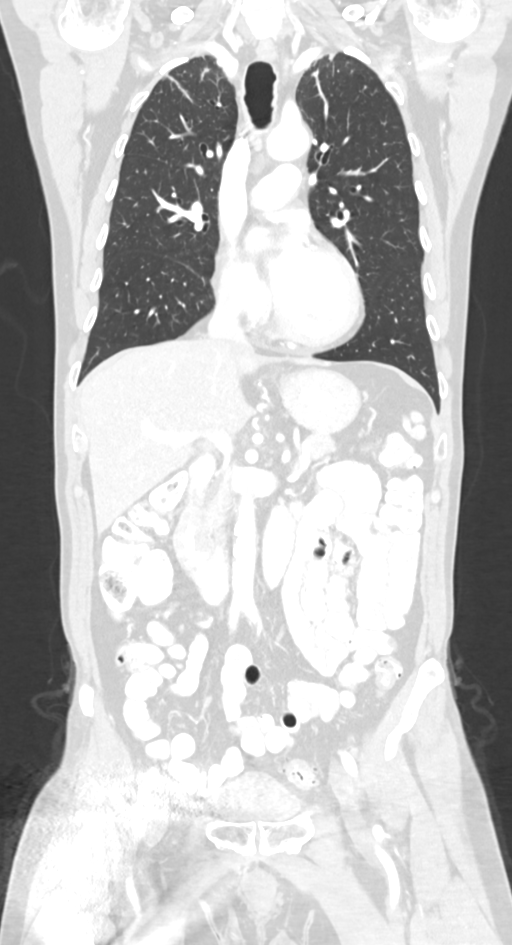
[im 80/133  lung]
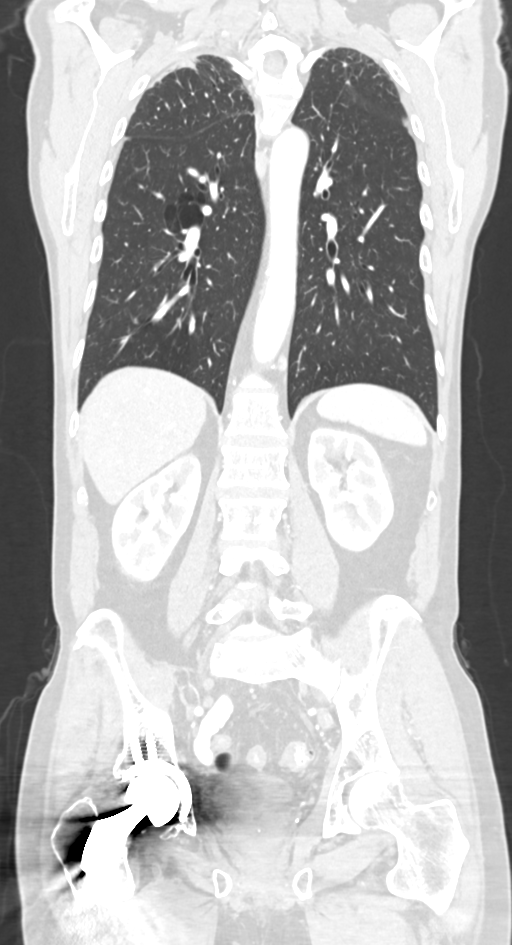

[12 of 36 positions shown; findings below may reference images not displayed]

FINDINGS: CT CHEST FINDINGS

Cardiovascular: The heart is normal in size. No pericardial
effusion. The aorta is normal in caliber. No dissection. The branch
vessels are normal. Scattered coronary artery calcifications.

Mediastinum/Nodes: No mediastinal or hilar mass or lymphadenopathy.
Small scattered lymph nodes are less than 8 mm. The esophagus is
grossly normal.

Lungs/Pleura: Overall stable dense apical scarring changes. No
worrisome pulmonary lesions or acute pulmonary findings. The
tracheobronchial tree is unremarkable. No endobronchial lesions are
identified. No pleural nodules or pleural effusion.

Musculoskeletal: No chest wall mass, supraclavicular or axillary
adenopathy. The thyroid gland appears normal.

The bony thorax is intact.  No bone lesions or fracture.

CT ABDOMEN PELVIS FINDINGS

Hepatobiliary: No focal hepatic lesions or intrahepatic biliary
dilatation. The liver contour is slightly irregular but I do not see
any other definite findings for cirrhosis. The gallbladder is
unremarkable. No common bile duct dilatation.

Pancreas: No mass, inflammation or ductal dilatation.

Spleen: Normal size.  No focal lesions.

Adrenals/Urinary Tract: The adrenal glands and kidneys are
unremarkable. The bladder is normal.

Stomach/Bowel: The stomach, duodenum, small bowel and colon are
grossly normal. No acute inflammatory changes, mass lesions or
obstructive findings. The terminal ileum and appendix are normal.
Moderate sigmoid colon diverticulosis without definite findings for
acute diverticulitis.

Vascular/Lymphatic: Moderate distal aortic and iliac artery
calcifications but no aneurysm or dissection. The branch vessels are
patent. The major venous structures are patent.

No mesenteric or retroperitoneal mass or adenopathy. Small scattered
lymph nodes are stable.

Reproductive: The prostate gland and seminal vesicles are grossly
normal. There is moderate artifact through the lower pelvis due to a
right hip prosthesis.

Other: No pelvic mass or adenopathy. No free pelvic fluid
collections. No inguinal mass or adenopathy. No abdominal wall
hernia or subcutaneous lesions.

Musculoskeletal: No significant bony findings. Stable scoliosis. No
worrisome bone lesions.
IMPRESSION: 1. No significant abdominal/pelvic findings. No acute inflammatory
changes, mass lesions or adenopathy.
2. Dense biapical pleural and parenchymal scarring changes but no
worrisome lung lesions or acute pulmonary findings.
3. No mediastinal or hilar mass or adenopathy.
4. Sigmoid diverticulosis without findings for acute diverticulitis.

## 2020-03-25 ENCOUNTER — Other Ambulatory Visit: Payer: Self-pay

## 2020-03-25 ENCOUNTER — Inpatient Hospital Stay (HOSPITAL_BASED_OUTPATIENT_CLINIC_OR_DEPARTMENT_OTHER): Payer: Medicare Other | Admitting: Oncology

## 2020-03-25 ENCOUNTER — Inpatient Hospital Stay: Payer: Medicare Other | Attending: Oncology

## 2020-03-25 ENCOUNTER — Encounter: Payer: Self-pay | Admitting: Oncology

## 2020-03-25 VITALS — BP 109/73 | HR 78 | Temp 98.3°F | Wt 157.0 lb

## 2020-03-25 DIAGNOSIS — Z87891 Personal history of nicotine dependence: Secondary | ICD-10-CM | POA: Diagnosis not present

## 2020-03-25 DIAGNOSIS — C880 Waldenstrom macroglobulinemia: Secondary | ICD-10-CM | POA: Diagnosis not present

## 2020-03-25 DIAGNOSIS — R1030 Lower abdominal pain, unspecified: Secondary | ICD-10-CM | POA: Diagnosis not present

## 2020-03-25 LAB — CBC WITH DIFFERENTIAL/PLATELET
Abs Immature Granulocytes: 0.02 10*3/uL (ref 0.00–0.07)
Basophils Absolute: 0 10*3/uL (ref 0.0–0.1)
Basophils Relative: 1 %
Eosinophils Absolute: 0.1 10*3/uL (ref 0.0–0.5)
Eosinophils Relative: 1 %
HCT: 40.7 % (ref 39.0–52.0)
Hemoglobin: 13.5 g/dL (ref 13.0–17.0)
Immature Granulocytes: 0 %
Lymphocytes Relative: 27 %
Lymphs Abs: 1.4 10*3/uL (ref 0.7–4.0)
MCH: 30.1 pg (ref 26.0–34.0)
MCHC: 33.2 g/dL (ref 30.0–36.0)
MCV: 90.8 fL (ref 80.0–100.0)
Monocytes Absolute: 0.4 10*3/uL (ref 0.1–1.0)
Monocytes Relative: 9 %
Neutro Abs: 3.1 10*3/uL (ref 1.7–7.7)
Neutrophils Relative %: 62 %
Platelets: 365 10*3/uL (ref 150–400)
RBC: 4.48 MIL/uL (ref 4.22–5.81)
RDW: 13.6 % (ref 11.5–15.5)
WBC: 5 10*3/uL (ref 4.0–10.5)
nRBC: 0 % (ref 0.0–0.2)

## 2020-03-25 LAB — COMPREHENSIVE METABOLIC PANEL
ALT: 21 U/L (ref 0–44)
AST: 26 U/L (ref 15–41)
Albumin: 4.2 g/dL (ref 3.5–5.0)
Alkaline Phosphatase: 96 U/L (ref 38–126)
Anion gap: 10 (ref 5–15)
BUN: 17 mg/dL (ref 8–23)
CO2: 29 mmol/L (ref 22–32)
Calcium: 9.2 mg/dL (ref 8.9–10.3)
Chloride: 102 mmol/L (ref 98–111)
Creatinine, Ser: 1.01 mg/dL (ref 0.61–1.24)
GFR, Estimated: >60 mL/min (ref >60)
Glucose, Bld: 101 mg/dL — ABNORMAL HIGH (ref 70–99)
Potassium: 4.8 mmol/L (ref 3.5–5.1)
Sodium: 141 mmol/L (ref 135–145)
Total Bilirubin: 1.5 mg/dL — ABNORMAL HIGH (ref 0.3–1.2)
Total Protein: 7.4 g/dL (ref 6.5–8.1)

## 2020-03-25 NOTE — Progress Notes (Signed)
Hematology/Oncology Consult note [facility]  [phone] [phone]  Patient Care Team: Maryland Pink, MD as PCP - General (Family Medicine) Clarene Essex, MD (Gastroenterology) Vladimir Crofts, MD as Consulting Physician (Neurology) Melvyn Novas, MD as Referring Physician (Orthopedic Surgery) Marval Regal, NP as Nurse Practitioner (Nurse Practitioner) Hollice Espy, MD as Consulting Physician (Urology) Lucky Cowboy Erskine Squibb, MD as Referring Physician (Vascular Surgery)   Name of the patient: [name]  [id number]  1951/01/03   Date of visit: 03/25/20  Diagnosis- [name]'s macroglobulinemia  Chief complaint/ Reason for visit-routine follow-up of [name] macroglobulinemia currently in remission  Heme/Onc history: Patient is a 69 year old male with a history of [name]'s macroglobulinemia and was seeing Dr. [name] previously and is transferring his care to me. He was initially diagnosed with [name] macroglobulinemia in 2012 when he presented with headache and visual changes. He received weekly Rituxan x4 followed by maintenance Rituxan for 2 years until March 2014. He had recurrence of dizziness and vertigo in June 2015 and underwent plasmapheresis at [facility] with improvement of symptoms and he was retreated with Rituxan x4. He completed this treatment in July 2015. He was started on maintenance ibrutinib in November 2015 which he did not tolerate. Maintenance Rituxan was started thereafter and stopped in November 2016. His IgM levels have been between 3000-5000 when he is symptomatic and presently in the 400s. Patient also has chronic abdominal pain and CT scans have been unrevealing.   Interval history-patient currently reports lower abdominal pain Which has been ongoing for the last 1 week.  Denies any burning or pain during urination.  Denies any fever.  Denies any changes in his bowel movements.   Few days ago pain was quite severe and he contemplated going to the ER.  Denies any associated nausea or vomiting   ECOG PS- 1 Pain scale- 4   Review of systems- Review of Systems  Constitutional: Negative for chills, fever, malaise/fatigue and weight loss.  HENT: Negative for congestion, ear discharge and nosebleeds.   Eyes: Negative for blurred vision.  Respiratory: Negative for cough, hemoptysis, sputum production, shortness of breath and wheezing.   Cardiovascular: Negative for chest pain, palpitations, orthopnea and claudication.  Gastrointestinal: Positive for abdominal pain. Negative for blood in stool, constipation, diarrhea, heartburn, melena, nausea and vomiting.  Genitourinary: Negative for dysuria, flank pain, frequency, hematuria and urgency.  Musculoskeletal: Negative for back pain, joint pain and myalgias.  Skin: Negative for rash.  Neurological: Negative for dizziness, tingling, focal weakness, seizures, weakness and headaches.  Endo/Heme/Allergies: Does not bruise/bleed easily.  Psychiatric/Behavioral: Negative for depression and suicidal ideas. The patient does not have insomnia.       Allergies  Allergen Reactions  . Ibrutinib Nausea Only     Past Medical History:  Diagnosis Date  . Anemia    prev bone marrow bx 02/11/10  . Anxiety   . Bone cancer ([place])   . Colitis 2004  . Colon polyps   . Depression   . Dysphagia   . GERD (gastroesophageal reflux disease)   . Need for prophylactic vaccination and inoculation against influenza 01/31/2013  . Neuropathy   . Sleep apnea 09-12-10   Uses CPAP  . 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[facility])      Past Surgical History:  Procedure Laterality Date  . COLONOSCOPY  03/17/2011   Procedure: COLONOSCOPY;  Surgeon: Jeryl Columbia, MD;  Location: [facility] ENDOSCOPY;  Service: Endoscopy;  Laterality: N/A;  . COLONOSCOPY  WITH PROPOFOL N/A 10/07/2016   Procedure: COLONOSCOPY WITH PROPOFOL;  Surgeon: Manya Silvas, MD;   Location: [facility] ENDOSCOPY;  Service: Endoscopy;  Laterality: N/A;  . ESOPHAGOGASTRODUODENOSCOPY (EGD) WITH PROPOFOL N/A 10/07/2016   Procedure: ESOPHAGOGASTRODUODENOSCOPY (EGD) WITH PROPOFOL;  Surgeon: Manya Silvas, MD;  Location: [facility] ENDOSCOPY;  Service: Endoscopy;  Laterality: N/A;  . ESOPHAGOGASTRODUODENOSCOPY (EGD) WITH PROPOFOL N/A 01/24/2018   Procedure: ESOPHAGOGASTRODUODENOSCOPY (EGD) WITH PROPOFOL with Gastric Mapping;  Surgeon: Jonathon Bellows, MD;  Location: [facility] ENDOSCOPY;  Service: Gastroenterology;  Laterality: N/A;  . HIP FRACTURE SURGERY  January 2013   R hip  . HIP FRACTURE SURGERY Right 02/2016  . NASAL FRACTURE SURGERY    . ROBOTIC ASSISTED LAPAROSCOPIC CHOLECYSTECTOMY-MULTI SITE N/A 03/17/2018   Procedure: ROBOTIC ASSISTED LAPAROSCOPIC CHOLECYSTECTOMY-MULTI SITE;  Surgeon: Jules Husbands, MD;  Location: [facility] ORS;  Service: General;  Laterality: N/A;    Social History   Socioeconomic History  . Marital status: Married    Spouse name: Not on file  . Number of children: Not on file  . Years of education: Not on file  . Highest education level: Not on file  Occupational History  . Not on file  Tobacco Use  . Smoking status: Former Smoker    Quit date: 04/14/2007    Years since quitting: 12.9  . Smokeless tobacco: Never Used  Substance and Sexual Activity  . Alcohol use: No  . Drug use: No  . Sexual activity: Not on file  Other Topics Concern  . Not on file  Social History Narrative  . Not on file   Social Determinants of Health   Financial Resource Strain: Not on file  Food Insecurity: Not on file  Transportation Needs: Not on file  Physical Activity: Not on file  Stress: Not on file  Social Connections: Not on file  Intimate Partner Violence: Not on file    Family History  Problem Relation Age of Onset  . Prostate cancer Neg Hx   . Bladder Cancer Neg Hx   . Kidney cancer Neg Hx      Current Outpatient Medications:  .  acetaminophen (TYLENOL) 500  MG tablet, Take 500 mg by mouth every 6 (six) hours as needed., Disp: , Rfl:  .  pantoprazole (PROTONIX) 40 MG tablet, Take 40 mg by mouth daily. , Disp: , Rfl:  .  PARoxetine (PAXIL) 40 MG tablet, Take 40 mg by mouth every morning., Disp: , Rfl:   Physical exam:  Vitals:   03/25/20 0949  BP: 109/73  Pulse: 78  Temp: 98.3 F (36.8 C)  TempSrc: Tympanic  SpO2: 100%  Weight: 157 lb (71.2 kg)   Physical Exam Constitutional:      General: He is not in acute distress. Eyes:     Extraocular Movements: EOM normal.     Pupils: Pupils are equal, round, and reactive to light.  Cardiovascular:     Rate and Rhythm: Normal rate and regular rhythm.     Heart sounds: Normal heart sounds.  Pulmonary:     Effort: Pulmonary effort is normal.     Breath sounds: Normal breath sounds.  Abdominal:     General: Bowel sounds are normal.     Palpations: Abdomen is soft.     Comments: Tenderness to superficial and deep palpation in the hypogastrium as well as right lower and left lower quadrant.  Skin:    General: Skin is warm and dry.  Neurological:     Mental Status: He is alert  and oriented to person, place, and time.      CMP Latest Ref Rng & Units 03/25/2020  Glucose 70 - 99 mg/dL 101(H)  BUN 8 - 23 mg/dL 17  Creatinine 0.61 - 1.24 mg/dL 1.01  Sodium 135 - 145 mmol/L 141  Potassium 3.5 - 5.1 mmol/L 4.8  Chloride 98 - 111 mmol/L 102  CO2 22 - 32 mmol/L 29  Calcium 8.9 - 10.3 mg/dL 9.2  Total Protein 6.5 - 8.1 g/dL 7.4  Total Bilirubin 0.3 - 1.2 mg/dL 1.5(H)  Alkaline Phos 38 - 126 U/L 96  AST 15 - 41 U/L 26  ALT 0 - 44 U/L 21   CBC Latest Ref Rng & Units 03/25/2020  WBC 4.0 - 10.5 K/uL 5.0  Hemoglobin 13.0 - 17.0 g/dL 13.5  Hematocrit 39.0 - 52.0 % 40.7  Platelets 150 - 400 K/uL 365     Assessment and plan- Patient is a 69 y.o. male with history of [name]'s macroglobulinemia here for routine follow-up  With regards to [employer] macroglobulinemia: Patient CBC and  CMP are within normal limits.  Myeloma panel is currently pending.He has not required any treatment for this since 2015.  I will continue to check his CBC CMP and myeloma panel in 6 months in 1 year and see him back in 1 year.  Abdominal pain: Etiology unclear.  He has diffuse lower abdominal pain and he is tender to touch in superficial and deep palpation.  No associated urinary symptoms.  He has seen Dr. Vicente Males in the past for biliary dyskinesia.I will get a CT abdomen and pelvis with contrast and based on the findings decide about further management.  Clinically patient is stable with no evidence of abdominal distention or bowel bladder complaints and hemodynamically stable   Visit Diagnosis 1. 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[place])      Dr. Randa Evens, MD, MPH [facility] at [facility] [phone] 03/25/2020 12:45 PM              '

## 2020-03-27 LAB — MULTIPLE MYELOMA PANEL, SERUM
Albumin SerPl Elph-Mcnc: 3.9 g/dL (ref 2.9–4.4)
Albumin/Glob SerPl: 1.3 (ref 0.7–1.7)
Alpha 1: 0.2 g/dL (ref 0.0–0.4)
Alpha2 Glob SerPl Elph-Mcnc: 0.8 g/dL (ref 0.4–1.0)
B-Globulin SerPl Elph-Mcnc: 1.1 g/dL (ref 0.7–1.3)
Gamma Glob SerPl Elph-Mcnc: 1 g/dL (ref 0.4–1.8)
Globulin, Total: 3.1 g/dL (ref 2.2–3.9)
IgA: 320 mg/dL (ref 61–437)
IgG (Immunoglobin G), Serum: 742 mg/dL (ref 603–1613)
IgM (Immunoglobulin M), Srm: 425 mg/dL — ABNORMAL HIGH (ref 20–172)
M Protein SerPl Elph-Mcnc: 0.4 g/dL — ABNORMAL HIGH
Total Protein ELP: 7 g/dL (ref 6.0–8.5)

## 2020-04-01 ENCOUNTER — Other Ambulatory Visit: Payer: Self-pay

## 2020-04-01 ENCOUNTER — Ambulatory Visit
Admission: RE | Admit: 2020-04-01 | Discharge: 2020-04-01 | Disposition: A | Discharge status: Home / Self Care | Payer: Medicare Other | Source: Ambulatory Visit | Attending: Oncology | Admitting: Oncology

## 2020-04-01 DIAGNOSIS — C8800 Waldenstrom macroglobulinemia not having achieved remission: Secondary | ICD-10-CM

## 2020-04-01 MED ORDER — IOHEXOL 300 MG/ML  SOLN
100.0000 mL | Freq: Once | INTRAMUSCULAR | Status: AC | PRN
  Administered 2020-04-01: 100 mL via INTRAVENOUS

## 2020-09-23 ENCOUNTER — Other Ambulatory Visit: Payer: Self-pay

## 2020-09-23 ENCOUNTER — Inpatient Hospital Stay: Payer: Medicare Other | Attending: Oncology

## 2020-09-23 DIAGNOSIS — C8800 Waldenstrom macroglobulinemia not having achieved remission: Secondary | ICD-10-CM

## 2020-09-23 DIAGNOSIS — Z87891 Personal history of nicotine dependence: Secondary | ICD-10-CM | POA: Diagnosis not present

## 2020-09-23 LAB — COMPREHENSIVE METABOLIC PANEL
ALT: 16 U/L (ref 0–44)
AST: 27 U/L (ref 15–41)
Albumin: 4.1 g/dL (ref 3.5–5.0)
Alkaline Phosphatase: 86 U/L (ref 38–126)
Anion gap: 11 (ref 5–15)
BUN: 20 mg/dL (ref 8–23)
CO2: 27 mmol/L (ref 22–32)
Calcium: 9.1 mg/dL (ref 8.9–10.3)
Chloride: 101 mmol/L (ref 98–111)
Creatinine, Ser: 1.07 mg/dL (ref 0.61–1.24)
GFR, Estimated: >60 mL/min (ref >60)
Glucose, Bld: 156 mg/dL — ABNORMAL HIGH (ref 70–99)
Potassium: 4.6 mmol/L (ref 3.5–5.1)
Sodium: 139 mmol/L (ref 135–145)
Total Bilirubin: 1.2 mg/dL (ref 0.3–1.2)
Total Protein: 7.4 g/dL (ref 6.5–8.1)

## 2020-09-23 LAB — CBC WITH DIFFERENTIAL/PLATELET
Abs Immature Granulocytes: 0.02 10*3/uL (ref 0.00–0.07)
Basophils Absolute: 0 10*3/uL (ref 0.0–0.1)
Basophils Relative: 1 %
Eosinophils Absolute: 0 10*3/uL (ref 0.0–0.5)
Eosinophils Relative: 1 %
HCT: 40 % (ref 39.0–52.0)
Hemoglobin: 13.6 g/dL (ref 13.0–17.0)
Immature Granulocytes: 0 %
Lymphocytes Relative: 20 %
Lymphs Abs: 1.2 10*3/uL (ref 0.7–4.0)
MCH: 30.2 pg (ref 26.0–34.0)
MCHC: 34 g/dL (ref 30.0–36.0)
MCV: 88.7 fL (ref 80.0–100.0)
Monocytes Absolute: 0.5 10*3/uL (ref 0.1–1.0)
Monocytes Relative: 8 %
Neutro Abs: 4.3 10*3/uL (ref 1.7–7.7)
Neutrophils Relative %: 70 %
Platelets: 327 10*3/uL (ref 150–400)
RBC: 4.51 MIL/uL (ref 4.22–5.81)
RDW: 14 % (ref 11.5–15.5)
WBC: 6.1 10*3/uL (ref 4.0–10.5)
nRBC: 0 % (ref 0.0–0.2)

## 2020-09-24 ENCOUNTER — Ambulatory Visit (INDEPENDENT_AMBULATORY_CARE_PROVIDER_SITE_OTHER): Payer: Medicare Other

## 2020-09-24 ENCOUNTER — Other Ambulatory Visit: Payer: Self-pay

## 2020-09-24 ENCOUNTER — Ambulatory Visit (INDEPENDENT_AMBULATORY_CARE_PROVIDER_SITE_OTHER): Payer: Medicare Other | Admitting: Vascular Surgery

## 2020-09-24 ENCOUNTER — Encounter (INDEPENDENT_AMBULATORY_CARE_PROVIDER_SITE_OTHER): Payer: Self-pay

## 2020-09-24 VITALS — BP 114/66 | HR 74 | Resp 16 | Wt 159.6 lb

## 2020-09-24 DIAGNOSIS — I714 Abdominal aortic aneurysm, without rupture, unspecified: Secondary | ICD-10-CM

## 2020-09-24 NOTE — Progress Notes (Signed)
Patient left before being seen by me.

## 2020-09-25 LAB — MULTIPLE MYELOMA PANEL, SERUM
Albumin SerPl Elph-Mcnc: 4 g/dL (ref 2.9–4.4)
Albumin/Glob SerPl: 1.5 (ref 0.7–1.7)
Alpha 1: 0.2 g/dL (ref 0.0–0.4)
Alpha2 Glob SerPl Elph-Mcnc: 0.7 g/dL (ref 0.4–1.0)
B-Globulin SerPl Elph-Mcnc: 1.1 g/dL (ref 0.7–1.3)
Gamma Glob SerPl Elph-Mcnc: 0.9 g/dL (ref 0.4–1.8)
Globulin, Total: 2.8 g/dL (ref 2.2–3.9)
IgA: 302 mg/dL (ref 61–437)
IgG (Immunoglobin G), Serum: 725 mg/dL (ref 603–1613)
IgM (Immunoglobulin M), Srm: 413 mg/dL — ABNORMAL HIGH (ref 20–172)
M Protein SerPl Elph-Mcnc: 0.3 g/dL — ABNORMAL HIGH
Total Protein ELP: 6.8 g/dL (ref 6.0–8.5)

## 2020-09-29 ENCOUNTER — Encounter (INDEPENDENT_AMBULATORY_CARE_PROVIDER_SITE_OTHER): Payer: Self-pay | Admitting: *Deleted

## 2020-10-29 ENCOUNTER — Encounter (INDEPENDENT_AMBULATORY_CARE_PROVIDER_SITE_OTHER): Payer: Self-pay | Admitting: *Deleted

## 2020-11-20 ENCOUNTER — Telehealth: Payer: Self-pay | Admitting: *Deleted

## 2020-11-20 NOTE — Telephone Encounter (Signed)
Please ask patient to reach out to pcp

## 2020-11-20 NOTE — Telephone Encounter (Signed)
Called patient and advised him to call PCP.

## 2020-11-20 NOTE — Telephone Encounter (Signed)
Patient's wife called to report that patient had pneumonia vaccine on Monday and since then he has developed body aches, he is febrile and has headache and sore throat. They do not have a thermometer.

## 2020-12-20 NOTE — Progress Notes (Signed)
[facility] [place], [place] [address]  Pulmonary Sleep Medicine   Office Visit Note  Patient Name: [name] DOB: 01/30/1951 MRN [id number]    Chief Complaint: Obstructive Sleep Apnea visit  Brief History:  [name] is seen today for one year follow up. The patient has a 10 year history of sleep apnea. Patient is using PAP nightly. @ 9 cmH2O  The patient feels much better after sleeping with PAP.  The patient reports benefiting and no more snoring from PAP use. Reported sleepiness is  resolved and the Epworth Sleepiness Score is 4 out of 24. The patient does not take naps. The patient complains of the following: wants to try nasal mask because the pillows have become uncomfortable  The compliance download shows  compliance with an average use time of 8:03 hours @ 99%. The AHI is 4.2  The patient does not complain of limb movements disrupting sleep.  ROS  General: (-) fever, (-) chills, (-) night sweat Nose and Sinuses: (-) nasal stuffiness or itchiness, (-) postnasal drip, (-) nosebleeds, (-) sinus trouble. Mouth and Throat: (-) sore throat, (-) hoarseness. Neck: (-) swollen glands, (-) enlarged thyroid, (-) neck pain. Respiratory: - cough, - shortness of breath, - wheezing. Neurologic: - numbness, - tingling. Psychiatric: - anxiety, - depression   Current Medication: Outpatient Encounter Medications as of 12/23/2020  Medication Sig   acetaminophen (TYLENOL) 500 MG tablet Take 500 mg by mouth every 6 (six) hours as needed.   pantoprazole (PROTONIX) 40 MG tablet Take 40 mg by mouth daily.    PARoxetine (PAXIL) 40 MG tablet Take 40 mg by mouth every morning.   No facility-administered encounter medications on file as of 12/23/2020.    Surgical History: Past Surgical History:  Procedure Laterality Date   COLONOSCOPY  03/17/2011   Procedure: COLONOSCOPY;  Surgeon: Jeryl Columbia, MD;  Location: [facility] ENDOSCOPY;  Service: Endoscopy;  Laterality: N/A;    COLONOSCOPY WITH PROPOFOL N/A 10/07/2016   Procedure: COLONOSCOPY WITH PROPOFOL;  Surgeon: Manya Silvas, MD;  Location: [facility] ENDOSCOPY;  Service: Endoscopy;  Laterality: N/A;   ESOPHAGOGASTRODUODENOSCOPY (EGD) WITH PROPOFOL N/A 10/07/2016   Procedure: ESOPHAGOGASTRODUODENOSCOPY (EGD) WITH PROPOFOL;  Surgeon: Manya Silvas, MD;  Location: [facility] ENDOSCOPY;  Service: Endoscopy;  Laterality: N/A;   ESOPHAGOGASTRODUODENOSCOPY (EGD) WITH PROPOFOL N/A 01/24/2018   Procedure: ESOPHAGOGASTRODUODENOSCOPY (EGD) WITH PROPOFOL with Gastric Mapping;  Surgeon: Jonathon Bellows, MD;  Location[facility] ENDOSCOPY;  Service: Gastroenterology;  Laterality: N/A;   HIP FRACTURE SURGERY  January 2013   R hip   HIP FRACTURE SURGERY Right 02/2016   NASAL FRACTURE SURGERY     ROBOTIC ASSISTED LAPAROSCOPIC CHOLECYSTECTOMY-MULTI SITE N/A 03/17/2018   Procedure: ROBOTIC ASSISTED LAPAROSCOPIC CHOLECYSTECTOMY-MULTI SITE;  Surgeon: Jules Husbands, MD;  Location: [facility] ORS;  Service: General;  Laterality: N/A;    Medical History: Past Medical History:  Diagnosis Date   Anemia    prev bone marrow bx 02/11/10   Anxiety    Bone cancer ([place])    Colitis 2004   Colon polyps    Depression    Dysphagia    GERD (gastroesophageal reflux disease)    Need for prophylactic vaccination and inoculation against influenza 01/31/2013   Neuropathy    Sleep apnea 09-12-10   Uses CPAP   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[place])     Family History: Non contributory to the present illness  Social History: Social History   Socioeconomic History   Marital status: Married    Spouse  name: Not on file   Number of children: Not on file   Years of education: Not on file   Highest education level: Not on file  Occupational History   Not on file  Tobacco Use   Smoking status: Former    Types: Cigarettes    Quit date: 04/14/2007    Years since quitting: 13.7   Smokeless tobacco: Never  Substance and Sexual Activity   Alcohol use: No    Drug use: No   Sexual activity: Not on file  Other Topics Concern   Not on file  Social History Narrative   Not on file   Social Determinants of Health   Financial Resource Strain: Not on file  Food Insecurity: Not on file  Transportation Needs: Not on file  Physical Activity: Not on file  Stress: Not on file  Social Connections: Not on file  Intimate Partner Violence: Not on file    Vital Signs: Blood pressure 112/67, pulse 78, resp. rate 16, height '5\' 11"'$  (1.803 m), weight 157 lb 6.4 oz (71.4 kg), SpO2 98 %.  Examination: General Appearance: The patient is well-developed, well-nourished, and in no distress. Neck Circumference: 37 cm Skin: Gross inspection of skin unremarkable. Head: normocephalic, no gross deformities. Eyes: no gross deformities noted. ENT: ears appear grossly normal Neurologic: Alert and oriented. No involuntary movements.    EPWORTH SLEEPINESS SCALE:  Scale:  (0)= no chance of dozing; (1)= slight chance of dozing; (2)= moderate chance of dozing; (3)= high chance of dozing  Chance  Situtation    Sitting and reading: 1    Watching TV: 2    Sitting Inactive in public: 0    As a passenger in car: 0      Lying down to rest: 1    Sitting and talking: 0    Sitting quielty after lunch: 0    In a car, stopped in traffic: 0   TOTAL SCORE:   4 out of 24    SLEEP STUDIES:  PSG 09/2010  AHI 16 SpO54mn 82%   CPAP COMPLIANCE DATA:  Date Range: 12/20/19 - 12/18/20  Average Daily Use: 8:03 hours  Median Use: 8:08 hours  Compliance for > 4 Hours: 99% days  AHI: 4.2 respiratory events per hour  Days Used: 365/365  Mask Leak: 47.6 lpm  95th Percentile Pressure: 9 cmH2O   LABS: No results found for this or any previous visit (from the past 2160 hour(s)).   Radiology: CT Abdomen Pelvis W Contrast  Result Date: 04/01/2020 CLINICAL DATA:  History of Waldenstrom's macroglobulinemia. Low abdominal pain. EXAM: CT ABDOMEN AND PELVIS WITH  CONTRAST TECHNIQUE: Multidetector CT imaging of the abdomen and pelvis was performed using the standard protocol following bolus administration of intravenous contrast. CONTRAST:  10107mOMNIPAQUE IOHEXOL 300 MG/ML  SOLN COMPARISON:  01/13/2018 FINDINGS: Lower chest: No acute abnormality. Hepatobiliary: No focal liver abnormality is seen. Status post cholecystectomy. No biliary dilatation. Pancreas: Unremarkable. No pancreatic ductal dilatation or surrounding inflammatory changes. Spleen: Normal in size without focal abnormality. Adrenals/Urinary Tract: Normal adrenal glands. No kidney mass or hydronephrosis. Urinary bladder is unremarkable. Stomach/Bowel: Normal appearance of the stomach. No dilated loops of large or small bowel. The appendix is visualized and appears normal. Distal colonic diverticulosis is identified without signs of acute inflammation. Vascular/Lymphatic: Aortic atherosclerosis. No aneurysm. No abdominopelvic adenopathy. Reproductive: Prostate is unremarkable. Other: No free fluid or fluid collection within the abdomen or pelvis. Musculoskeletal: No acute or significant osseous findings. Previous right hip arthroplasty.  IMPRESSION: 1. No acute findings within the abdomen or pelvis. No mass or adenopathy identified. 2. Distal colonic diverticulosis without signs of acute inflammation. 3. Aortic atherosclerosis. Aortic Atherosclerosis (ICD10-I70.0). Electronically Signed   By: Kerby Moors M.D.   On: 04/01/2020 16:01    No results found.  No results found.    Assessment and Plan: Patient Active Problem List   Diagnosis Date Noted   Biliary dyskinesia    Right hip pain 07/10/2017   Influenza vaccine administered 01/25/2017   AAA (abdominal aortic aneurysm) without rupture ([place]) 01/13/2017   Prostatitis 01/12/2017   Aortic atherosclerosis ([place]) 01/12/2017   Urinary dysfunction 12/11/2016   Testicular asymmetry 12/11/2016   Enteritis 12/11/2016   Diarrhea, functional 12/11/2016    Abnormal CT scan, gastrointestinal tract 12/11/2016   Abdominal pain, diffuse 12/11/2016   Erosive gastritis 12/03/2016   Bilateral lower abdominal pain 12/03/2016   History of colonic polyps 07/27/2016   Abnormal CAT scan 09/17/2013   Abnormal CT scan, chest 09/17/2013   Waldenstrom macroglobulinemia ([place]) 09/16/2013   Need for prophylactic vaccination and inoculation against influenza 01/31/2013   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[place])    GERD (gastroesophageal reflux disease)     1. OSA on CPAP The patient does  tolerate PAP and reports  benefit from PAP use. The patient was reminded how to clean equipment and advised to replace supplies routinely.Marland Kitchen He has mask leak and will be fitted for a mask. . . The compliance is excellent. The AHI is 4.2.   OSA- continue with excellent compliance. Mask fitting will be done. F/u one year  2. CPAP use counseling CPAP Counseling: had a lengthy discussion with the patient regarding the importance of PAP therapy in management of the sleep apnea. Patient appears to understand the risk factor reduction and also understands the risks associated with untreated sleep apnea. Patient will try to make a good faith effort to remain compliant with therapy. Also instructed the patient on proper cleaning of the device including the water must be changed daily if possible and use of distilled water is preferred. Patient understands that the machine should be regularly cleaned with appropriate recommended cleaning solutions that do not damage the PAP machine for example given white vinegar and water rinses. Other methods such as ozone treatment may not be as good as these simple methods to achieve cleaning.   3. Gastroesophageal reflux disease without esophagitis Stable with pantoprazole. Controlled.    General Counseling: I have discussed the findings of the evaluation and examination with Gwyndolyn Saxon.  I have also discussed any further diagnostic evaluation thatmay be  needed or ordered today. Luay verbalizes understanding of the findings of todays visit. We also reviewed his medications today and discussed drug interactions and side effects including but not limited excessive drowsiness and altered mental states. We also discussed that there is always a risk not just to him but also people around him. he has been encouraged to call the office with any questions or concerns that should arise related to todays visit.  No orders of the defined types were placed in this encounter.       I have personally obtained a history, examined the patient, evaluated laboratory and imaging results, formulated the assessment and plan and placed orders.   This patient was seen today by Tressie Ellis, PA-C in collaboration with Dr. Devona Konig.    Allyne Gee, MD [facility] Diplomate ABMS Pulmonary and Critical Care Medicine Sleep medicine

## 2020-12-23 ENCOUNTER — Other Ambulatory Visit: Payer: Self-pay

## 2020-12-23 ENCOUNTER — Ambulatory Visit (INDEPENDENT_AMBULATORY_CARE_PROVIDER_SITE_OTHER): Payer: Medicare Other | Admitting: Internal Medicine

## 2020-12-23 VITALS — BP 112/67 | HR 78 | Resp 16 | Ht 71.0 in | Wt 157.4 lb

## 2020-12-23 DIAGNOSIS — Z9989 Dependence on other enabling machines and devices: Secondary | ICD-10-CM

## 2020-12-23 DIAGNOSIS — K219 Gastro-esophageal reflux disease without esophagitis: Secondary | ICD-10-CM

## 2020-12-23 DIAGNOSIS — G4733 Obstructive sleep apnea (adult) (pediatric): Secondary | ICD-10-CM

## 2020-12-23 DIAGNOSIS — Z7189 Other specified counseling: Secondary | ICD-10-CM

## 2020-12-23 NOTE — Patient Instructions (Signed)

## 2021-03-20 ENCOUNTER — Other Ambulatory Visit: Payer: Self-pay | Admitting: *Deleted

## 2021-03-20 DIAGNOSIS — C880 Waldenstrom macroglobulinemia: Secondary | ICD-10-CM

## 2021-03-25 ENCOUNTER — Encounter: Payer: Self-pay | Admitting: Nurse Practitioner

## 2021-03-25 ENCOUNTER — Other Ambulatory Visit: Payer: Self-pay

## 2021-03-25 ENCOUNTER — Inpatient Hospital Stay: Payer: Medicare Other

## 2021-03-25 ENCOUNTER — Inpatient Hospital Stay: Payer: Medicare Other | Attending: Nurse Practitioner | Admitting: Nurse Practitioner

## 2021-03-25 VITALS — BP 137/82 | HR 77 | Temp 97.7°F | Resp 20 | Wt 163.8 lb

## 2021-03-25 DIAGNOSIS — Z87891 Personal history of nicotine dependence: Secondary | ICD-10-CM | POA: Diagnosis not present

## 2021-03-25 DIAGNOSIS — C880 Waldenstrom macroglobulinemia: Secondary | ICD-10-CM

## 2021-03-25 LAB — COMPREHENSIVE METABOLIC PANEL
ALT: 17 U/L (ref 0–44)
AST: 22 U/L (ref 15–41)
Albumin: 4.1 g/dL (ref 3.5–5.0)
Alkaline Phosphatase: 83 U/L (ref 38–126)
Anion gap: 10 (ref 5–15)
BUN: 19 mg/dL (ref 8–23)
CO2: 27 mmol/L (ref 22–32)
Calcium: 9 mg/dL (ref 8.9–10.3)
Chloride: 102 mmol/L (ref 98–111)
Creatinine, Ser: 1.04 mg/dL (ref 0.61–1.24)
GFR, Estimated: >60 mL/min (ref >60)
Glucose, Bld: 103 mg/dL — ABNORMAL HIGH (ref 70–99)
Potassium: 3.8 mmol/L (ref 3.5–5.1)
Sodium: 139 mmol/L (ref 135–145)
Total Bilirubin: 1.3 mg/dL — ABNORMAL HIGH (ref 0.3–1.2)
Total Protein: 7.5 g/dL (ref 6.5–8.1)

## 2021-03-25 LAB — CBC WITH DIFFERENTIAL/PLATELET
Abs Immature Granulocytes: 0.01 10*3/uL (ref 0.00–0.07)
Basophils Absolute: 0 10*3/uL (ref 0.0–0.1)
Basophils Relative: 0 %
Eosinophils Absolute: 0 10*3/uL (ref 0.0–0.5)
Eosinophils Relative: 0 %
HCT: 38.1 % — ABNORMAL LOW (ref 39.0–52.0)
Hemoglobin: 13 g/dL (ref 13.0–17.0)
Immature Granulocytes: 0 %
Lymphocytes Relative: 20 %
Lymphs Abs: 1.1 10*3/uL (ref 0.7–4.0)
MCH: 30.4 pg (ref 26.0–34.0)
MCHC: 34.1 g/dL (ref 30.0–36.0)
MCV: 89.2 fL (ref 80.0–100.0)
Monocytes Absolute: 0.5 10*3/uL (ref 0.1–1.0)
Monocytes Relative: 9 %
Neutro Abs: 3.9 10*3/uL (ref 1.7–7.7)
Neutrophils Relative %: 71 %
Platelets: 358 10*3/uL (ref 150–400)
RBC: 4.27 MIL/uL (ref 4.22–5.81)
RDW: 14.1 % (ref 11.5–15.5)
WBC: 5.5 10*3/uL (ref 4.0–10.5)
nRBC: 0 % (ref 0.0–0.2)

## 2021-03-25 NOTE — Progress Notes (Signed)
Hematology/Oncology Consult Note [facility]  [phone] [phone]  Patient Care Team: Maryland Pink, MD as PCP - General (Family Medicine) Clarene Essex, MD (Gastroenterology) Vladimir Crofts, MD as Consulting Physician (Neurology) Melvyn Novas, MD as Referring Physician (Orthopedic Surgery) Marval Regal, NP as Nurse Practitioner (Nurse Practitioner) Hollice Espy, MD as Consulting Physician (Urology) Lucky Cowboy Erskine Squibb, MD as Referring Physician (Vascular Surgery)   Name of the patient: [name]  [id number]  05/09/1950   Date of visit: 03/25/21  Diagnosis- r/o 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Chief complaint/ Reason for visit-routine follow-up of 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currently in remission  Heme/Onc history: Patient is a 70 year old male with a history of Walden Strom's macroglobulinemia and was seeing Dr. Mike Gip previously and is transferring his care to me.  He was initially diagnosed with [name] macroglobulinemia in 2012 when he presented with headache and visual changes.  He received weekly Rituxan x4 followed by maintenance Rituxan for 2 years until March 2014.  He had recurrence of dizziness and vertigo in June 2015 and underwent plasmapheresis at [facility] with improvement of symptoms and he was retreated with Rituxan x4.  He completed this treatment in July 2015.  He was started on maintenance ibrutinib in November 2015 which he did not tolerate.  Maintenance Rituxan was started thereafter and stopped in November 2016.  His IgM levels have been between 3000-5000 when he is symptomatic and presently in the 400s.  Patient also has chronic abdominal pain and CT scans have been unrevealing.     Interval history- Patient is 70 year old male who returns to clinic for follow up.  He continues to feel well and denies specific complaints.  Has Tecentriq presents for been diagnosed with lung cancer and he questions  his risk.  ECOG PS- 0 Pain scale- 0  Review of systems- Review of Systems  Constitutional:  Negative for chills, fever, malaise/fatigue and weight loss.  HENT:  Negative for hearing loss, nosebleeds, sore throat and tinnitus.   Eyes:  Negative for blurred vision and double vision.  Respiratory:  Negative for cough, hemoptysis, shortness of breath and wheezing.   Cardiovascular:  Negative for chest pain, palpitations and leg swelling.  Gastrointestinal:  Negative for abdominal pain, blood in stool, constipation, diarrhea, melena, nausea and vomiting.  Genitourinary:  Negative for dysuria and urgency.  Musculoskeletal:  Negative for back pain, falls, joint pain and myalgias.  Skin:  Negative for itching and rash.  Neurological:  Negative for dizziness, tingling, sensory change, loss of consciousness, weakness and headaches.  Endo/Heme/Allergies:  Negative for environmental allergies. Does not bruise/bleed easily.  Psychiatric/Behavioral:  Negative for depression. The patient is not nervous/anxious and does not have insomnia.     Allergies  Allergen Reactions   Ibrutinib Nausea Only   Past Medical History:  Diagnosis Date   Anemia    prev bone marrow bx 02/11/10   Anxiety    Bone cancer ([place])    Colitis 2004   Colon polyps    Depression    Dysphagia    GERD (gastroesophageal reflux disease)    Need for prophylactic vaccination and inoculation against influenza 01/31/2013   Neuropathy    Sleep apnea 09-12-10   Uses CPAP   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[facility])    Past Surgical History:  Procedure Laterality Date   COLONOSCOPY  03/17/2011   Procedure: COLONOSCOPY;  Surgeon: Jeryl Columbia, MD;  Location: [facility] ENDOSCOPY;  Service: Endoscopy;  Laterality: N/A;   COLONOSCOPY WITH PROPOFOL  N/A 10/07/2016   Procedure: COLONOSCOPY WITH PROPOFOL;  Surgeon: Manya Silvas, MD;  Location: [facility] ENDOSCOPY;  Service: Endoscopy;  Laterality: N/A;   ESOPHAGOGASTRODUODENOSCOPY (EGD) WITH  PROPOFOL N/A 10/07/2016   Procedure: ESOPHAGOGASTRODUODENOSCOPY (EGD) WITH PROPOFOL;  Surgeon: Manya Silvas, MD;  Location: [facility] ENDOSCOPY;  Service: Endoscopy;  Laterality: N/A;   ESOPHAGOGASTRODUODENOSCOPY (EGD) WITH PROPOFOL N/A 01/24/2018   Procedure: ESOPHAGOGASTRODUODENOSCOPY (EGD) WITH PROPOFOL with Gastric Mapping;  Surgeon: Jonathon Bellows, MD;  Location: [facility] ENDOSCOPY;  Service: Gastroenterology;  Laterality: N/A;   HIP FRACTURE SURGERY  January 2013   R hip   HIP FRACTURE SURGERY Right 02/2016   NASAL FRACTURE SURGERY     ROBOTIC ASSISTED LAPAROSCOPIC CHOLECYSTECTOMY-MULTI SITE N/A 03/17/2018   Procedure: ROBOTIC ASSISTED LAPAROSCOPIC CHOLECYSTECTOMY-MULTI SITE;  Surgeon: Jules Husbands, MD;  Location: [facility] ORS;  Service: General;  Laterality: N/A;   Social History   Socioeconomic History   Marital status: Married    Spouse name: Not on file   Number of children: Not on file   Years of education: Not on file   Highest education level: Not on file  Occupational History   Not on file  Tobacco Use   Smoking status: Former    Types: Cigarettes    Quit date: 04/14/2007    Years since quitting: 13.9   Smokeless tobacco: Never  Substance and Sexual Activity   Alcohol use: No   Drug use: No   Sexual activity: Not on file  Other Topics Concern   Not on file  Social History Narrative   Not on file   Social Determinants of Health   Financial Resource Strain: Not on file  Food Insecurity: Not on file  Transportation Needs: Not on file  Physical Activity: Not on file  Stress: Not on file  Social Connections: Not on file  Intimate Partner Violence: Not on file   Family History  Problem Relation Age of Onset   Prostate cancer Neg Hx    Bladder Cancer Neg Hx    Kidney cancer Neg Hx    Current Outpatient Medications:    acetaminophen (TYLENOL) 500 MG tablet, Take 500 mg by mouth every 6 (six) hours as needed., Disp: , Rfl:    pantoprazole (PROTONIX) 40 MG tablet, Take 40  mg by mouth daily. , Disp: , Rfl:    PARoxetine (PAXIL) 40 MG tablet, Take 40 mg by mouth every morning., Disp: , Rfl:   Physical exam:  Vitals:   03/25/21 1000  BP: 137/82  Pulse: 77  Resp: 20  Temp: 97.7 F (36.5 C)  SpO2: 100%  Weight: 163 lb 12.8 oz (74.3 kg)   Physical Exam Constitutional:      General: He is not in acute distress.    Appearance: He is well-developed.  HENT:     Head: Normocephalic and atraumatic.     Nose: Nose normal.     Mouth/Throat:     Pharynx: No oropharyngeal exudate.  Eyes:     General: No scleral icterus.    Conjunctiva/sclera: Conjunctivae normal.  Cardiovascular:     Rate and Rhythm: Normal rate and regular rhythm.     Heart sounds: Normal heart sounds.  Pulmonary:     Effort: Pulmonary effort is normal.     Breath sounds: Normal breath sounds. No wheezing.  Abdominal:     General: There is no distension.     Palpations: Abdomen is soft.     Tenderness: There is no abdominal tenderness.  Musculoskeletal:  General: Normal range of motion.     Cervical back: Normal range of motion and neck supple.  Skin:    General: Skin is warm and dry.     Coloration: Skin is not pale.     Findings: Lesion (1 cm lesion on right nose; well circumscribed borders. Shiny. even pigmentation.) present.  Neurological:     Mental Status: He is alert and oriented to person, place, and time.  Psychiatric:        Behavior: Behavior normal.     CMP Latest Ref Rng & Units 03/25/2021  Glucose 70 - 99 mg/dL 103(H)  BUN 8 - 23 mg/dL 19  Creatinine 0.61 - 1.24 mg/dL 1.04  Sodium 135 - 145 mmol/L 139  Potassium 3.5 - 5.1 mmol/L 3.8  Chloride 98 - 111 mmol/L 102  CO2 22 - 32 mmol/L 27  Calcium 8.9 - 10.3 mg/dL 9.0  Total Protein 6.5 - 8.1 g/dL 7.5  Total Bilirubin 0.3 - 1.2 mg/dL 1.3(H)  Alkaline Phos 38 - 126 U/L 83  AST 15 - 41 U/L 22  ALT 0 - 44 U/L 17   CBC Latest Ref Rng & Units 03/25/2021  WBC 4.0 - 10.5 K/uL 5.5  Hemoglobin 13.0 - 17.0 g/dL  13.0  Hematocrit 39.0 - 52.0 % 38.1(L)  Platelets 150 - 400 K/uL 358     Assessment and plan- Patient is a 70 y.o. male with history of Walden Strom's macroglobulinemia here for routine follow-up  CBC are within normal limits.  Multiple myeloma panel is pending at time of visit.  He has not required any treatment for this since 2017.  He continues to be asymptomatic.  Continue observation.  Skin lesion- recommend he contact dermatology for evaluation, management, and yearly TBSE.  Disposition: 1 year- lab (cbc, cmp, MM panel), Janese Banks   Visit Diagnosis 1. 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[place])    Beckey Rutter, [place], AGNP-C [place] [facility] [phone] (clinic) 03/25/2021

## 2021-03-25 NOTE — Progress Notes (Signed)
Patient states he has a spot on his nose and under his eye.

## 2021-03-31 LAB — MULTIPLE MYELOMA PANEL, SERUM
Albumin SerPl Elph-Mcnc: 4 g/dL (ref 2.9–4.4)
Albumin/Glob SerPl: 1.3 (ref 0.7–1.7)
Alpha 1: 0.2 g/dL (ref 0.0–0.4)
Alpha2 Glob SerPl Elph-Mcnc: 0.8 g/dL (ref 0.4–1.0)
B-Globulin SerPl Elph-Mcnc: 1.2 g/dL (ref 0.7–1.3)
Gamma Glob SerPl Elph-Mcnc: 1.1 g/dL (ref 0.4–1.8)
Globulin, Total: 3.2 g/dL (ref 2.2–3.9)
IgA: 319 mg/dL (ref 61–437)
IgG (Immunoglobin G), Serum: 867 mg/dL (ref 603–1613)
IgM (Immunoglobulin M), Srm: 414 mg/dL — ABNORMAL HIGH (ref 20–172)
M Protein SerPl Elph-Mcnc: 0.4 g/dL — ABNORMAL HIGH
Total Protein ELP: 7.2 g/dL (ref 6.0–8.5)

## 2021-07-22 ENCOUNTER — Emergency Department: Payer: Medicare Other

## 2021-07-22 ENCOUNTER — Other Ambulatory Visit: Payer: Self-pay

## 2021-07-22 ENCOUNTER — Encounter: Payer: Self-pay | Admitting: Intensive Care

## 2021-07-22 ENCOUNTER — Emergency Department
Admission: EM | Admit: 2021-07-22 | Discharge: 2021-07-22 | Disposition: A | Discharge status: Home / Self Care | Payer: Medicare Other | Attending: Student in an Organized Health Care Education/Training Program | Admitting: Student in an Organized Health Care Education/Training Program

## 2021-07-22 DIAGNOSIS — R0789 Other chest pain: Secondary | ICD-10-CM

## 2021-07-22 DIAGNOSIS — R55 Syncope and collapse: Secondary | ICD-10-CM | POA: Insufficient documentation

## 2021-07-22 DIAGNOSIS — R1013 Epigastric pain: Secondary | ICD-10-CM | POA: Insufficient documentation

## 2021-07-22 LAB — BASIC METABOLIC PANEL
Anion gap: 10 (ref 5–15)
BUN: 26 mg/dL — ABNORMAL HIGH (ref 8–23)
CO2: 25 mmol/L (ref 22–32)
Calcium: 9.5 mg/dL (ref 8.9–10.3)
Chloride: 102 mmol/L (ref 98–111)
Creatinine, Ser: 1.04 mg/dL (ref 0.61–1.24)
GFR, Estimated: >60 mL/min (ref >60)
Glucose, Bld: 105 mg/dL — ABNORMAL HIGH (ref 70–99)
Potassium: 3.8 mmol/L (ref 3.5–5.1)
Sodium: 137 mmol/L (ref 135–145)

## 2021-07-22 LAB — CBC
HCT: 41.3 % (ref 39.0–52.0)
Hemoglobin: 13.6 g/dL (ref 13.0–17.0)
MCH: 29 pg (ref 26.0–34.0)
MCHC: 32.9 g/dL (ref 30.0–36.0)
MCV: 88.1 fL (ref 80.0–100.0)
Platelets: 353 10*3/uL (ref 150–400)
RBC: 4.69 MIL/uL (ref 4.22–5.81)
RDW: 13.7 % (ref 11.5–15.5)
WBC: 7.3 10*3/uL (ref 4.0–10.5)
nRBC: 0 % (ref 0.0–0.2)

## 2021-07-22 LAB — TROPONIN I (HIGH SENSITIVITY)
Troponin I (High Sensitivity): 6 ng/L (ref <18)
Troponin I (High Sensitivity): 6 ng/L (ref <18)

## 2021-07-22 MED ORDER — IOHEXOL 350 MG/ML SOLN
100.0000 mL | Freq: Once | INTRAVENOUS | Status: AC | PRN
Start: 2021-07-22 — End: 2021-07-22
  Administered 2021-07-22: 100 mL via INTRAVENOUS

## 2021-07-22 MED ORDER — SODIUM CHLORIDE 0.9 % IV BOLUS
500.0000 mL | Freq: Once | INTRAVENOUS | Status: AC
Start: 2021-07-22 — End: 2021-07-22
  Administered 2021-07-22: 500 mL via INTRAVENOUS

## 2021-07-22 NOTE — ED Notes (Signed)
Pt to CT

## 2021-07-22 NOTE — ED Triage Notes (Signed)
Pt comes pov from [facility] with left sided chest pain, abd pain with sweating, weakness. Has a hx of AAA.  ?

## 2021-07-22 NOTE — ED Triage Notes (Signed)
Patient c/o central chest tightness with nausea that started around 11am. Reports "I broke out in a sweat"  ?

## 2021-07-22 NOTE — ED Provider Notes (Addendum)
? ?[facility] ?Provider Note ? ? ? Event Date/Time  ? First MD Initiated Contact with Patient 07/22/21 1413   ?  (approximate) ? ? ?History  ? ?Chest Pain ? ? ?HPI ? ?[name] is a 71 y.o. 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  Was walking back.  Felt a pressure in his chest and stomach became diaphoretic symptoms lasted several minutes.  Felt like he was about to pass out but did not fully lose consciousness.  Still has some mild discomfort in his chest.  Has not had any melena no hematochezia.  No fevers.  No pain ripping or tearing through to his back. ?  ? ? ?Physical Exam  ? ?Triage Vital Signs: ?ED Triage Vitals  ?Enc Vitals Group  ?   BP 07/22/21 1208 (!) 148/95  ?   Pulse Rate 07/22/21 1208 90  ?   Resp 07/22/21 1208 16  ?   Temp 07/22/21 1208 98.1 ?F (36.7 ?C)  ?   Temp Source 07/22/21 1208 Oral  ?   SpO2 07/22/21 1208 99 %  ?   Weight 07/22/21 1206 158 lb (71.7 kg)  ?   Height 07/22/21 1206 '5\' 11"'$  (1.803 m)  ?   Head Circumference --   ?   Peak Flow --   ?   Pain Score 07/22/21 1206 3  ?   Pain Loc --   ?   Pain Edu? --   ?   Excl. in [place]? --   ? ? ?Most recent vital signs: ?Vitals:  ? 07/22/21 1409 07/22/21 1430  ?BP: 136/87 140/89  ?Pulse: 81 86  ?Resp: (!) 21 (!) 23  ?Temp:    ?SpO2: 100% 99%  ? ? ? ?Constitutional: Alert  ?Eyes: Conjunctivae are normal.  ?Head: Atraumatic. ?Nose: No congestion/rhinnorhea. ?Mouth/Throat: Mucous membranes are moist.   ?Neck: Painless ROM.  ?Cardiovascular:   Good peripheral circulation. ?Respiratory: Normal respiratory effort.  No retractions.  ?Gastrointestinal: Soft and nontender.  ?Musculoskeletal:  no deformity ?Neurologic:  MAE spontaneously. No gross focal neurologic deficits are appreciated.  ?Skin:  Skin is warm, dry and intact. No  rash noted. ?Psychiatric: Mood and affect are normal. Speech and behavior are normal. ? ? ? ?ED Results / Procedures / Treatments  ? ?Labs ?(all labs ordered are listed, but only abnormal results are displayed) ?Labs Reviewed  ?BASIC METABOLIC PANEL - Abnormal; Notable for the following components:  ?    Result Value  ? Glucose, Bld 105 (*)   ? BUN 26 (*)   ? All other components within normal limits  ?CBC  ?TROPONIN I (HIGH SENSITIVITY)  ?TROPONIN I (HIGH SENSITIVITY)  ? ? ? ?EKG ? ?ED ECG REPORT ?I, Merlyn Lot, the attending physician, personally viewed and interpreted this ECG. ? ? Date: 07/22/2021 ? EKG Time: 12:07 ? Rate: 95 ? Rhythm: normal EKG, normal sinus rhythm, unchanged from previous tracings ? Axis: left ? Intervals:normal ? ST&T Change: no stemi, no depressions ? ? ? ?RADIOLOGY ?Please see ED Course for my review and interpretation. ? ?I personally reviewed all radiographic images ordered to evaluate for the above acute complaints and reviewed radiology reports and findings.  These findings were personally discussed with the patient.  Please see medical record for radiology report. ? ? ? ?  PROCEDURES: ? ?Critical Care performed: No ? ?Procedures ? ? ?MEDICATIONS ORDERED IN ED: ?Medications  ?sodium chloride 0.9 % bolus 500 mL (500 mLs Intravenous New Bag/Given 07/22/21 1435)  ?iohexol (OMNIPAQUE) 350 MG/ML injection 100 mL (100 mLs Intravenous Contrast Given 07/22/21 1445)  ? ? ? ?IMPRESSION / MDM / ASSESSMENT AND PLAN / ED COURSE  ?I reviewed the triage vital signs and the nursing notes. ?             ?               ? ?Differential diagnosis includes, but is not limited to, ACS, dissection, aneurysm, pneumonia, gastritis, esophagitis, pancreatitis, SBO, mass, diverticulitis, dysrhythmia ? ?Patient presenting to the ER with symptoms as described above.  Given his age and risk factors with presenting symptoms certainly concerning for the above differential but the patient clinically appears very  well at this time.  Blood work as well as imaging will be ordered for the above differential.  Initial troponin negative.  EKG without evidence of acute ischemia.  CTA will be ordered to evaluate given concern for aneurysm or dissection the patient otherwise clinically well-appearing.  ? ?Clinical Course as of 07/22/21 1530  ?Tue Jul 22, 2021  ?1528 Patient's work-up is reassuring. [PR]  ?58 CT imaging without evidence of dissection or acute abnormality.  No sign of SDH IPH.  Not clinically consistent with CVA or SAH.  He is pain-free.  His troponins are negative.  EKG is nonischemic.  We discussed admission the hospital given his age and further observation.  Patient states he feels well at this time feels comfortable with close outpatient follow-up which I think is reasonable.  We discussed strict return precautions. [PR]  ?  ?Clinical Course User Index ?[PR] Merlyn Lot, MD  ? ? ? ?FINAL CLINICAL IMPRESSION(S) / ED DIAGNOSES  ? ?Final diagnoses:  ?Atypical chest pain  ? ? ? ?Rx / DC Orders  ? ?ED Discharge Orders   ? ? None  ? ?  ? ? ? ?Note:  This document was prepared using Dragon voice recognition software and may include unintentional dictation errors. ? ?  ?Merlyn Lot, MD ?07/22/21 1512 ? ?  ?Merlyn Lot, MD ?07/22/21 1530 ? ?

## 2021-09-22 ENCOUNTER — Other Ambulatory Visit (INDEPENDENT_AMBULATORY_CARE_PROVIDER_SITE_OTHER): Payer: Self-pay | Admitting: Vascular Surgery

## 2021-09-22 DIAGNOSIS — I714 Abdominal aortic aneurysm, without rupture, unspecified: Secondary | ICD-10-CM

## 2021-09-23 ENCOUNTER — Encounter (INDEPENDENT_AMBULATORY_CARE_PROVIDER_SITE_OTHER): Payer: Self-pay | Admitting: Nurse Practitioner

## 2021-09-23 ENCOUNTER — Ambulatory Visit (INDEPENDENT_AMBULATORY_CARE_PROVIDER_SITE_OTHER): Payer: Medicare Other | Admitting: Nurse Practitioner

## 2021-09-23 ENCOUNTER — Ambulatory Visit (INDEPENDENT_AMBULATORY_CARE_PROVIDER_SITE_OTHER): Payer: Medicare Other

## 2021-09-23 VITALS — BP 113/70 | HR 74 | Resp 16 | Ht 71.0 in | Wt 155.0 lb

## 2021-09-23 DIAGNOSIS — H268 Other specified cataract: Secondary | ICD-10-CM | POA: Insufficient documentation

## 2021-09-23 DIAGNOSIS — I700 Atherosclerosis of aorta: Secondary | ICD-10-CM

## 2021-09-23 DIAGNOSIS — K219 Gastro-esophageal reflux disease without esophagitis: Secondary | ICD-10-CM | POA: Insufficient documentation

## 2021-09-23 DIAGNOSIS — I714 Abdominal aortic aneurysm, without rupture, unspecified: Secondary | ICD-10-CM

## 2021-09-23 DIAGNOSIS — M1711 Unilateral primary osteoarthritis, right knee: Secondary | ICD-10-CM | POA: Insufficient documentation

## 2021-09-23 DIAGNOSIS — M25561 Pain in right knee: Secondary | ICD-10-CM | POA: Insufficient documentation

## 2021-09-23 DIAGNOSIS — Z23 Encounter for immunization: Secondary | ICD-10-CM | POA: Insufficient documentation

## 2021-09-23 DIAGNOSIS — H40003 Preglaucoma, unspecified, bilateral: Secondary | ICD-10-CM | POA: Insufficient documentation

## 2021-09-23 DIAGNOSIS — F419 Anxiety disorder, unspecified: Secondary | ICD-10-CM | POA: Insufficient documentation

## 2021-09-23 DIAGNOSIS — I7143 Infrarenal abdominal aortic aneurysm, without rupture: Secondary | ICD-10-CM

## 2021-09-23 DIAGNOSIS — Z96641 Presence of right artificial hip joint: Secondary | ICD-10-CM | POA: Insufficient documentation

## 2021-09-23 NOTE — Progress Notes (Signed)
Subjective:    Patient ID: [name], male    DOB: [birth year], 71 y.o.   MRN: [id number] Chief Complaint  Patient presents with   Follow-up    ultrasound    Patient returns today in follow up of his AAA. He is doing well. He has no aneurysm related symptoms. . His AAA measures 3.4 cm in maximal diameter today, slightly increased from 3.1 cm    Review of Systems  All other systems reviewed and are negative.      Objective:   Physical Exam Vitals reviewed.  HENT:     Head: Normocephalic.  Neck:     Vascular: No carotid bruit.  Cardiovascular:     Rate and Rhythm: Normal rate and regular rhythm.     Pulses: Normal pulses.     Heart sounds: Normal heart sounds.  Pulmonary:     Effort: Pulmonary effort is normal.  Skin:    General: Skin is warm and dry.  Neurological:     Mental Status: He is alert and oriented to person, place, and time.  Psychiatric:        Mood and Affect: Mood normal.        Behavior: Behavior normal.        Thought Content: Thought content normal.        Judgment: Judgment normal.     BP 113/70 (BP Location: Left Arm)   Pulse 74   Resp 16   Ht '5\' 11"'$  (1.803 m)   Wt 155 lb (70.3 kg)   BMI 21.62 kg/m   Past Medical History:  Diagnosis Date   Anemia    prev bone marrow bx 02/11/10   Anxiety    Bone cancer ([place])    Colitis 2004   Colon polyps    Depression    Dysphagia    GERD (gastroesophageal reflux disease)    Need for prophylactic vaccination and inoculation against influenza 01/31/2013   Neuropathy    Sleep apnea 09-12-10   Uses CPAP   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[facility])     Social History   Socioeconomic History   Marital status: Married    Spouse name: Not on file   Number of children: Not on file   Years of education: Not on file   Highest education level: Not on file  Occupational History   Not on file  Tobacco Use   Smoking status: Former    Types: Cigarettes    Quit date: 04/14/2007    Years since  quitting: 14.4   Smokeless tobacco: Never  Substance and Sexual Activity   Alcohol use: No   Drug use: No   Sexual activity: Not on file  Other Topics Concern   Not on file  Social History Narrative   Not on file   Social Determinants of Health   Financial Resource Strain: Not on file  Food Insecurity: Not on file  Transportation Needs: Not on file  Physical Activity: Not on file  Stress: Not on file  Social Connections: Not on file  Intimate Partner Violence: Not on file    Past Surgical History:  Procedure Laterality Date   COLONOSCOPY  03/17/2011   Procedure: COLONOSCOPY;  Surgeon: Jeryl Columbia, MD;  Location: [facility] ENDOSCOPY;  Service: Endoscopy;  Laterality: N/A;   COLONOSCOPY WITH PROPOFOL N/A 10/07/2016   Procedure: COLONOSCOPY WITH PROPOFOL;  Surgeon: Manya Silvas, MD;  Location: [facility] ENDOSCOPY;  Service: Endoscopy;  Laterality: N/A;   ESOPHAGOGASTRODUODENOSCOPY (EGD) WITH PROPOFOL N/A 10/07/2016  Procedure: ESOPHAGOGASTRODUODENOSCOPY (EGD) WITH PROPOFOL;  Surgeon: Manya Silvas, MD;  [facility] END[facility]COPY;  Service: Endoscopy;  Laterality: N/A;   ESOPHAGOGASTRODUODENOSCOPY (EGD) WITH PROPOFOL N/A 01/24/2018   Procedure: ESOPHAGOGASTRODUODENOSCOPY (EGD) WITH PROPOFOL with Gastric Mapping;  Surgeon: Jonathon Bellows, MD;  Location: [facility] ENDOSCOPY;  Service: Gastroenterology;  Laterality: N/A;   HIP FRACTURE SURGERY  January 2013   R hip   HIP FRACTURE SURGERY Right 02/2016   NASAL FRACTURE SURGERY     ROBOTIC ASSISTED LAPAROSCOPIC CHOLECYSTECTOMY-MULTI SITE N/A 03/17/2018   Procedure: ROBOTIC ASSISTED LAPAROSCOPIC CHOLECYSTECTOMY-MULTI SITE;  Surgeon: Jules Husbands, MD;  Location: [facility] ORS;  Service: General;  Laterality: N/A;    Family History  Problem Relation Age of Onset   Prostate cancer Neg Hx    Bladder Cancer Neg Hx    Kidney cancer Neg Hx     Allergies  Allergen Reactions   Ibrutinib Nausea Only       Latest Ref Rng & Units 07/22/2021   12:09 PM  03/25/2021    9:48 AM 09/23/2020    9:46 AM  CBC  WBC 4.0 - 10.5 K/uL 7.3  5.5  6.1   Hemoglobin 13.0 - 17.0 g/dL 13.6  13.0  13.6   Hematocrit 39.0 - 52.0 % 41.3  38.1  40.0   Platelets 150 - 400 K/uL 353  358  327       CMP     Component Value Date/Time   NA 137 07/22/2021 1209   NA 140 04/18/2014 0847   NA 143 05/30/2013 0801   K 3.8 07/22/2021 1209   K 4.7 04/18/2014 0847   K 3.9 05/30/2013 0801   CL 102 07/22/2021 1209   CL 104 04/18/2014 0847   CL 104 08/31/2012 0748   CO2 25 07/22/2021 1209   CO2 26 04/18/2014 0847   CO2 24 05/30/2013 0801   GLUCOSE 105 (H) 07/22/2021 1209   GLUCOSE 96 04/18/2014 0847   GLUCOSE 99 05/30/2013 0801   GLUCOSE 103 (H) 08/31/2012 0748   BUN 26 (H) 07/22/2021 1209   BUN 23 (H) 04/18/2014 0847   BUN 20.6 05/30/2013 0801   CREATININE 1.04 07/22/2021 1209   CREATININE 0.99 04/18/2014 0847   CREATININE 0.8 05/30/2013 0801   CALCIUM 9.5 07/22/2021 1209   CALCIUM 8.5 04/18/2014 0847   CALCIUM 9.4 05/30/2013 0801   PROT 7.5 03/25/2021 0948   PROT 7.3 04/18/2014 0847   PROT 6.9 05/30/2013 0801   ALBUMIN 4.1 03/25/2021 0948   ALBUMIN 4.2 04/18/2014 0847   ALBUMIN 4.2 05/30/2013 0801   AST 22 03/25/2021 0948   AST 27 04/18/2014 0847   AST 24 05/30/2013 0801   ALT 17 03/25/2021 0948   ALT 33 04/18/2014 0847   ALT 17 05/30/2013 0801   ALKPHOS 83 03/25/2021 0948   ALKPHOS 90 04/18/2014 0847   ALKPHOS 75 05/30/2013 0801   BILITOT 1.3 (H) 03/25/2021 0948   BILITOT 1.0 04/18/2014 0847   BILITOT 0.91 05/30/2013 0801   GFRNONAA >60 07/22/2021 1209   GFRNONAA >60 04/18/2014 0847   GFRNONAA >60 12/27/2013 1010   GFRAA >60 09/22/2019 0902   GFRAA >60 04/18/2014 0847   GFRAA >60 12/27/2013 1010     No results found.     Assessment & Plan:   1. Infrarenal abdominal aortic aneurysm (AAA) without rupture (HCC) His AAA measures 3.4 cm in maximal diameter today. No major changes. Recheck in one year.  2. Aortic atherosclerosis  (HCC) Currently no significant stenosis, we  will continue to monitor   Current Outpatient Medications on File Prior to Visit  Medication Sig Dispense Refill   acetaminophen (TYLENOL) 500 MG tablet Take 500 mg by mouth every 6 (six) hours as needed.     pantoprazole (PROTONIX) 40 MG tablet Take 40 mg by mouth daily.      PARoxetine (PAXIL) 40 MG tablet Take 40 mg by mouth every morning.     No current facility-administered medications on file prior to visit.    There are no Patient Instructions on file for this visit. No follow-ups on file.   Kris Hartmann, NP

## 2021-12-22 ENCOUNTER — Ambulatory Visit (INDEPENDENT_AMBULATORY_CARE_PROVIDER_SITE_OTHER): Payer: Medicare Other | Admitting: Internal Medicine

## 2021-12-22 VITALS — BP 120/70 | HR 82 | Resp 14 | Ht 71.0 in | Wt 159.9 lb

## 2021-12-22 DIAGNOSIS — Z7189 Other specified counseling: Secondary | ICD-10-CM | POA: Insufficient documentation

## 2021-12-22 DIAGNOSIS — K219 Gastro-esophageal reflux disease without esophagitis: Secondary | ICD-10-CM

## 2021-12-22 DIAGNOSIS — G4733 Obstructive sleep apnea (adult) (pediatric): Secondary | ICD-10-CM

## 2021-12-22 DIAGNOSIS — Z9989 Dependence on other enabling machines and devices: Secondary | ICD-10-CM

## 2021-12-22 NOTE — Progress Notes (Unsigned)
[facility] [place], [place] [address]  Pulmonary Sleep Medicine   Office Visit Note  Patient Name: [name] DOB: 10/17/1950 MRN [id number]    Chief Complaint: Obstructive Sleep Apnea visit  Brief History:  Arrow is seen today for an annual follow up on CPAP at 9 cmh20.  The patient has a 11 year history of sleep apnea. Patient is using PAP nightly.  The patient feels rested after sleeping with PAP.  The patient reports benefits from PAP use. Reported sleepiness is improved and the Epworth Sleepiness Score is 1 out of 24. The patient does not take naps. The patient complains of the following: No complaints.  The compliance download shows 99% compliance with an average use time of 8 hours. The AHI is 3.9.  The patient does not complain of limb movements disrupting sleep.  ROS  General: (-) fever, (-) chills, (-) night sweat Nose and Sinuses: (-) nasal stuffiness or itchiness, (-) postnasal drip, (-) nosebleeds, (-) sinus trouble. Mouth and Throat: (-) sore throat, (-) hoarseness. Neck: (-) swollen glands, (-) enlarged thyroid, (-) neck pain. Respiratory: - cough, - shortness of breath, - wheezing. Neurologic: - numbness, - tingling. Psychiatric: - anxiety, - depression   Current Medication: Outpatient Encounter Medications as of 12/22/2021  Medication Sig   acetaminophen (TYLENOL) 500 MG tablet Take 500 mg by mouth every 6 (six) hours as needed.   pantoprazole (PROTONIX) 40 MG tablet Take 40 mg by mouth daily.    PARoxetine (PAXIL) 40 MG tablet Take 40 mg by mouth every morning.   No facility-administered encounter medications on file as of 12/22/2021.    Surgical History: Past Surgical History:  Procedure Laterality Date   COLONOSCOPY  03/17/2011   Procedure: COLONOSCOPY;  Surgeon: Jeryl Columbia, MD;  Location: [facility] ENDOSCOPY;  Service: Endoscopy;  Laterality: N/A;   COLONOSCOPY WITH PROPOFOL N/A 10/07/2016   Procedure: COLONOSCOPY WITH  PROPOFOL;  Surgeon: Manya Silvas, MD;  Location: [facility] ENDOSCOPY;  Service: Endoscopy;  Laterality: N/A;   ESOPHAGOGASTRODUODENOSCOPY (EGD) WITH PROPOFOL N/A 10/07/2016   Procedure: ESOPHAGOGASTRODUODENOSCOPY (EGD) WITH PROPOFOL;  Surgeon: Manya Silvas, MD;  Location: [facility] ENDOSCOPY;  Service: Endoscopy;  Laterality: N/A;   ESOPHAGOGASTRODUODENOSCOPY (EGD) WITH PROPOFOL N/A 01/24/2018   Procedure: ESOPHAGOGASTRODUODENOSCOPY (EGD) WITH PROPOFOL with Gastric Mapping;  Surgeon: Jonathon Bellows, MD;  Location: [facility] ENDOSCOPY;  Service: Gastroenterology;  Laterality: N/A;   HIP FRACTURE SURGERY  January 2013   R hip   HIP FRACTURE SURGERY Right 02/2016   NASAL FRACTURE SURGERY     ROBOTIC ASSISTED LAPAROSCOPIC CHOLECYSTECTOMY-MULTI SITE N/A 03/17/2018   Procedure: ROBOTIC ASSISTED LAPAROSCOPIC CHOLECYSTECTOMY-MULTI SITE;  Surgeon: Jules Husbands, MD;  Location: [facility] ORS;  Service: General;  Laterality: N/A;    Medical History: Past Medical History:  Diagnosis Date   Anemia    prev bone marrow bx 02/11/10   Anxiety    Bone cancer ([place])    Colitis 2004   Colon polyps    Depression    Dysphagia    GERD (gastroesophageal reflux disease)    Need for prophylactic vaccination and inoculation against influenza 01/31/2013   Neuropathy    Sleep apnea 09-12-10   Uses CPAP   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[place])     Family History: Non contributory to the present illness  Social History: Social History   Socioeconomic History   Marital status: Married    Spouse name: Not on file   Number of children: Not on file  Years of education: Not on file   Highest education level: Not on file  Occupational History   Not on file  Tobacco Use   Smoking status: Former    Types: Cigarettes    Quit date: 04/14/2007    Years since quitting: 14.7   Smokeless tobacco: Never  Substance and Sexual Activity   Alcohol use: No   Drug use: No   Sexual activity: Not on file  Other Topics Concern    Not on file  Social History Narrative   Not on file   Social Determinants of Health   Financial Resource Strain: Not on file  Food Insecurity: Not on file  Transportation Needs: Not on file  Physical Activity: Not on file  Stress: Not on file  Social Connections: Not on file  Intimate Partner Violence: Not on file    Vital Signs: There were no vitals taken for this visit. There is no height or weight on file to calculate BMI.    Examination: General Appearance: The patient is well-developed, well-nourished, and in no distress. Neck Circumference: 39 cm Skin: Gross inspection of skin unremarkable. Head: normocephalic, no gross deformities. Eyes: no gross deformities noted. ENT: ears appear grossly normal Neurologic: Alert and oriented. No involuntary movements.  STOP BANG RISK ASSESSMENT S (snore) Have you been told that you snore?     NO   T (tired) Are you often tired, fatigued, or sleepy during the day?   NO  O (obstruction) Do you stop breathing, choke, or gasp during sleep? NO   P (pressure) Do you have or are you being treated for high blood pressure? NO   B (BMI) Is your body index greater than 35 kg/m? NO   A (age) Are you 71 years old or older? YES   N (neck) Do you have a neck circumference greater than 16 inches?   NO   G (gender) Are you a male? YES   TOTAL STOP/BANG "YES" ANSWERS 2       A STOP-Bang score of 2 or less is considered low risk, and a score of 5 or more is high risk for having either moderate or severe OSA. For people who score 3 or 4, doctors may need to perform further assessment to determine how likely they are to have OSA.         EPWORTH SLEEPINESS SCALE:  Scale:  (0)= no chance of dozing; (1)= slight chance of dozing; (2)= moderate chance of dozing; (3)= high chance of dozing  Chance  Situtation    Sitting and reading: 0    Watching TV: 1    Sitting Inactive in public: 0    As a passenger in car: 0      Lying down to  rest: 0    Sitting and talking: 0    Sitting quielty after lunch: 0    In a car, stopped in traffic: 0   TOTAL SCORE:   1 out of 24    SLEEP STUDIES:  PSG (10/04/10)  AHI 16.3, SPO2  82% TITRATION (10/11/10)  CPAP AT 7 CMH20   CPAP COMPLIANCE DATA:  Date Range: 12/18/20 - 12/17/21   Average Daily Use: 8 hours 2 minutes  Median Use: 8 hours 7 minutes  Compliance for > 4 Hours: 363 days  AHI: 3.9 respiratory events per hour  Days Used: 365/365  Mask Leak: 42.3  95th Percentile Pressure: 9 cmh20         LABS: No results found for  this or any previous visit (from the past 2160 hour(s)).  Radiology: CT Angio Chest/Abd/Pel for Dissection W and/or Wo Contrast  Result Date: 07/22/2021 CLINICAL DATA:  Acute aortic syndrome suspected. Left-sided chest pain. Diaphoresis. Weakness. Personal history of abdominal aortic aneurysm. EXAM: CT ANGIOGRAPHY CHEST, ABDOMEN AND PELVIS TECHNIQUE: Non-contrast CT of the chest was initially obtained. Multidetector CT imaging through the chest, abdomen and pelvis was performed using the standard protocol during bolus administration of intravenous contrast. Multiplanar reconstructed images and MIPs were obtained and reviewed to evaluate the vascular anatomy. RADIATION DOSE REDUCTION: This exam was performed according to the departmental dose-optimization program which includes automated exposure control, adjustment of the mA and/or kV according to patient size and/or use of iterative reconstruction technique. CONTRAST:  134m OMNIPAQUE IOHEXOL 350 MG/ML SOLN COMPARISON:  CT abdomen and pelvis 04/01/2020. CT chest with contrast 01/13/2018. FINDINGS: CTA CHEST FINDINGS Cardiovascular: Heart size is normal. Dense atherosclerotic calcifications are present both the left and right circumflex arteries. No significant pericardial effusion is present. Three vessel arch configuration is present. Aortic arch otherwise within normal limits. Minimal  calcifications are present the descending thoracic aorta. No significant aneurysm is present. Mediastinum/Nodes: No significant mediastinal, hilar, or axillary adenopathy is present. Thoracic inlet is within normal limits. The esophagus is unremarkable. Lungs/Pleura: Bronchiectasis and scarring is noted the lung apices bilaterally. This is stable. Lungs are otherwise clear. No other nodule, mass, or airspace disease is present. No significant pleural disease is present. Musculoskeletal: Rightward curvature is again noted in the upper thoracic spine. Remote superior endplate fracture at T7 is stable. Ribs are unremarkable. No chest wall lesions are present. Review of the MIP images confirms the above findings. CTA ABDOMEN AND PELVIS FINDINGS VASCULAR Aorta: Atherosclerotic changes are present. No focal aneurysm or dissection is present. Celiac: Patent without evidence of aneurysm, dissection, vasculitis or significant stenosis. SMA: Patent without evidence of aneurysm, dissection, vasculitis or significant stenosis. Renals: Moderate stenosis is present at the origin of the left renal artery stenosis measures greater than 60%. Right renal artery origin is within normal limits. No other focal renal artery disease present. IMA: Patent without evidence of aneurysm, dissection, vasculitis or significant stenosis. Inflow: Atherosclerotic calcifications are present in the iliac arteries bilaterally. No focal stenosis or aneurysm. Veins: No obvious venous abnormality within the limitations of this arterial phase study. Review of the MIP images confirms the above findings. NON-VASCULAR Hepatobiliary: No focal liver abnormality is seen. Status post cholecystectomy. No biliary dilatation. Pancreas: Unremarkable. No pancreatic ductal dilatation or surrounding inflammatory changes. Spleen: Normal in size without focal abnormality. Adrenals/Urinary Tract: Adrenal glands are normal bilaterally. Kidneys are unremarkable. No stone or  mass lesion is present. No obstruction is present. The urinary bladder is within normal limits. Stomach/Bowel: The stomach and duodenum are within normal limits. Small bowel is unremarkable. The appendix is visualized and normal. The ascending and transverse colon are within normal limits. Diverticular changes are present throughout the sigmoid colon. No focal inflammation is present to suggest diverticulitis. Lymphatic: No significant adenopathy is present. Reproductive: Status post hysterectomy. No adnexal masses. Other: No abdominal wall hernia or abnormality. No abdominopelvic ascites. Musculoskeletal: Right CT arthroplasty noted. No focal osseous lesions are present. Vertebral body heights and alignment are normal. Bony pelvis is unremarkable. Review of the MIP images confirms the above findings. IMPRESSION: 1. No aortic aneurysm or dissection. 2. Coronary artery disease. Dense calcifications in the left and right circumflex arteries. 3. Moderate stenosis at the origin of the left renal artery.  4. Chronic superior endplate fracture at T7 is stable. 5. Sigmoid diverticulosis without evidence for diverticulitis. 6. Aortic Atherosclerosis (ICD10-I70.0). Electronically Signed   By: San Morelle M.D.   On: 07/22/2021 15:18   CT HEAD WO CONTRAST (5MM)  Result Date: 07/22/2021 CLINICAL DATA:  Chest pain, diaphoresis, weakness, dizziness EXAM: CT HEAD WITHOUT CONTRAST TECHNIQUE: Contiguous axial images were obtained from the base of the skull through the vertex without intravenous contrast. RADIATION DOSE REDUCTION: This exam was performed according to the departmental dose-optimization program which includes automated exposure control, adjustment of the mA and/or kV according to patient size and/or use of iterative reconstruction technique. COMPARISON:  04/17/2017 FINDINGS: Brain: Age-related mild atrophy pattern. No acute intracranial hemorrhage, mass lesion, new infarction, midline shift, herniation,  hydrocephalus, or extra-axial fluid collection. No focal mass effect or edema. Mild cerebellar atrophy as well. Vascular: No hyperdense vessel or unexpected calcification. Skull: Normal. Negative for fracture or focal lesion. Sinuses/Orbits: No acute finding. Other: None. IMPRESSION: Normal head CT without contrast for age. Electronically Signed   By: Jerilynn Mages.  Shick M.D.   On: 07/22/2021 15:14   DG Chest 2 View  Result Date: 07/22/2021 CLINICAL DATA:  Chest pain. EXAM: CHEST - 2 VIEW COMPARISON:  Chest x-ray 04/17/2016.  Chest CT 01/13/2018. FINDINGS: Similar biapical pleuroparenchymal scarring, better characterized on prior chest CT. No new consolidation. No visible pleural effusions or pneumothorax. Cardiomediastinal silhouette is within normal limits and similar to prior. Similar mild height loss of multiple midthoracic vertebral bodies. IMPRESSION: 1. No evidence of acute cardiopulmonary disease. 2. Similar biapical pleuroparenchymal scarring. Electronically Signed   By: Margaretha Sheffield M.D.   On: 07/22/2021 12:38    No results found.  No results found.    Assessment and Plan: Patient Active Problem List   Diagnosis Date Noted   History of right hip replacement 09/23/2021   Anxiety 09/23/2021   Encounter for immunization 09/23/2021   Other specified cataract 09/23/2021   Preglaucoma, unspecified, bilateral 09/23/2021   Unilateral primary osteoarthritis, right knee 09/23/2021   Gastro-esophageal reflux disease without esophagitis 09/23/2021   Gastroesophageal reflux disease 09/23/2021   Pain in right knee 09/23/2021   History of total hip arthroplasty 04/08/2018   Biliary dyskinesia    Right hip pain 07/10/2017   Diverticulosis of sigmoid colon 02/12/2017   Influenza vaccine administered 01/25/2017   AAA (abdominal aortic aneurysm) without rupture ([place]) 01/13/2017   Prostatitis 01/12/2017   Aortic atherosclerosis ([place]) 01/12/2017   Urinary dysfunction 12/11/2016   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 OSA on CPAP The patient does tolerate PAP and reports  benefit from PAP use. The patient was reminded how to clean equipment and advised to replace supplies routinely. . The compliance is excellent. The AHI is 3.9.   OSA on cpap CPAP continues to be medically necessary to treat this patient's OSA.  F/u one year. Continue excellent compliance with pap.    2. CPAP use counseling CPAP Counseling: had a lengthy discussion with the patient  regarding the importance of PAP therapy in management of the sleep apnea. Patient appears to understand the risk factor reduction and also understands the risks associated with untreated sleep apnea. Patient will try to make a good faith effort to remain compliant with therapy. Also instructed the patient on proper cleaning of the device including the water must be changed daily if possible and use of distilled water is preferred. Patient understands that the machine should be regularly cleaned with appropriate recommended cleaning solutions that do not damage the PAP machine for example given white vinegar and water rinses. Other methods such as ozone treatment may not be as good as these simple methods to achieve cleaning.   3. Gastroesophageal reflux disease without esophagitis Doing well and asymptomatic with pantoprazole.    General Counseling: I have discussed the findings of the evaluation  and examination with Gwyndolyn Saxon.  I have also discussed any further diagnostic evaluation thatmay be needed or ordered today. Keigan verbalizes understanding of the findings of todays visit. We also reviewed his medications today and discussed drug interactions and side effects including but not limited excessive drowsiness and altered mental states. We also discussed that there is always a risk not just to him but also people around him. he has been encouraged to call the office with any questions or concerns that should arise related to todays visit.  No orders of the defined types were placed in this encounter.       I have personally obtained a history, examined the patient, evaluated laboratory and imaging results, formulated the assessment and plan and placed orders. This patient was seen today by Tressie Ellis, PA-C in collaboration with Dr. Devona Konig.   Allyne Gee, MD [facility] Diplomate ABMS Pulmonary Critical Care Medicine and Sleep Medicine

## 2021-12-22 NOTE — Patient Instructions (Signed)

## 2022-02-09 ENCOUNTER — Encounter (INDEPENDENT_AMBULATORY_CARE_PROVIDER_SITE_OTHER): Payer: Self-pay

## 2022-03-25 ENCOUNTER — Inpatient Hospital Stay: Payer: Medicare Other | Attending: Oncology

## 2022-03-25 ENCOUNTER — Other Ambulatory Visit: Payer: Self-pay

## 2022-03-25 ENCOUNTER — Inpatient Hospital Stay (HOSPITAL_BASED_OUTPATIENT_CLINIC_OR_DEPARTMENT_OTHER): Payer: Medicare Other | Admitting: Oncology

## 2022-03-25 ENCOUNTER — Encounter: Payer: Self-pay | Admitting: Oncology

## 2022-03-25 VITALS — BP 133/87 | HR 79 | Temp 97.6°F | Ht 71.0 in | Wt 161.0 lb

## 2022-03-25 DIAGNOSIS — Z87891 Personal history of nicotine dependence: Secondary | ICD-10-CM | POA: Diagnosis not present

## 2022-03-25 DIAGNOSIS — C880 Waldenstrom macroglobulinemia: Secondary | ICD-10-CM | POA: Insufficient documentation

## 2022-03-25 DIAGNOSIS — Z8579 Personal history of other malignant neoplasms of lymphoid, hematopoietic and related tissues: Secondary | ICD-10-CM

## 2022-03-25 LAB — CBC WITH DIFFERENTIAL/PLATELET
Abs Immature Granulocytes: 0.02 10*3/uL (ref 0.00–0.07)
Basophils Absolute: 0 10*3/uL (ref 0.0–0.1)
Basophils Relative: 1 %
Eosinophils Absolute: 0 10*3/uL (ref 0.0–0.5)
Eosinophils Relative: 1 %
HCT: 40.7 % (ref 39.0–52.0)
Hemoglobin: 13.5 g/dL (ref 13.0–17.0)
Immature Granulocytes: 0 %
Lymphocytes Relative: 20 %
Lymphs Abs: 1.4 10*3/uL (ref 0.7–4.0)
MCH: 30.1 pg (ref 26.0–34.0)
MCHC: 33.2 g/dL (ref 30.0–36.0)
MCV: 90.8 fL (ref 80.0–100.0)
Monocytes Absolute: 0.6 10*3/uL (ref 0.1–1.0)
Monocytes Relative: 8 %
Neutro Abs: 4.7 10*3/uL (ref 1.7–7.7)
Neutrophils Relative %: 70 %
Platelets: 369 10*3/uL (ref 150–400)
RBC: 4.48 MIL/uL (ref 4.22–5.81)
RDW: 13.7 % (ref 11.5–15.5)
WBC: 6.7 10*3/uL (ref 4.0–10.5)
nRBC: 0 % (ref 0.0–0.2)

## 2022-03-25 LAB — COMPREHENSIVE METABOLIC PANEL
ALT: 17 U/L (ref 0–44)
AST: 23 U/L (ref 15–41)
Albumin: 4.3 g/dL (ref 3.5–5.0)
Alkaline Phosphatase: 88 U/L (ref 38–126)
Anion gap: 10 (ref 5–15)
BUN: 22 mg/dL (ref 8–23)
CO2: 26 mmol/L (ref 22–32)
Calcium: 8.9 mg/dL (ref 8.9–10.3)
Chloride: 105 mmol/L (ref 98–111)
Creatinine, Ser: 0.9 mg/dL (ref 0.61–1.24)
GFR, Estimated: >60 mL/min (ref >60)
Glucose, Bld: 98 mg/dL (ref 70–99)
Potassium: 3.9 mmol/L (ref 3.5–5.1)
Sodium: 141 mmol/L (ref 135–145)
Total Bilirubin: 1 mg/dL (ref 0.3–1.2)
Total Protein: 7.9 g/dL (ref 6.5–8.1)

## 2022-03-25 NOTE — Progress Notes (Signed)
Hematology/Oncology Consult note [facility]  Telephone:([phone] [phone]  Patient Care Team: Maryland Pink, MD as PCP - General (Family Medicine) Clarene Essex, MD (Gastroenterology) Vladimir Crofts, MD as Consulting Physician (Neurology) Melvyn Novas, MD as Referring Physician (Orthopedic Surgery) Marval Regal, NP as Nurse Practitioner (Nurse Practitioner) Hollice Espy, MD as Consulting Physician (Urology) Lucky Cowboy Erskine Squibb, MD as Referring Physician (Vascular Surgery) Sindy Guadeloupe, MD as Consulting Physician (Oncology)   Name of the patient: Laythan Hayter  [id number]  [birth year]   Date of visit: 03/25/22  Diagnosis-history of Waldenstrm's macroglobulinemia  Chief complaint/ Reason for visit-routine follow up of Waldenstrm's macroglobulinemia currently in remission  Heme/Onc history: Patient is a 71 year old male with a history of Walden Strom's macroglobulinemia and was seeing Dr. Mike Gip previously and is transferring his care to me.  He was initially diagnosed with [name] macroglobulinemia in 2012 when he presented with headache and visual changes.  He received weekly Rituxan x4 followed by maintenance Rituxan for 2 years until March 2014.  He had recurrence of dizziness and vertigo in June 2015 and underwent plasmapheresis at [facility] with improvement of symptoms and he was retreated with Rituxan x4.  He completed this treatment in July 2015.  He was started on maintenance ibrutinib in November 2015 which he did not tolerate.  Maintenance Rituxan was started thereafter and stopped in November 2016.  His IgM levels have been between 3000-5000 when he is symptomatic and presently in the 400s.  Patient also has chronic abdominal pain and CT scans have been unrevealing.     Interval history-patient reports pain in his bilateral legs which comes on and off.  No recent falls.  Denies any blurry vision or headaches.  Denies any  changes in his appetite or weight.  ECOG PS- 1 Pain scale- 3   Review of systems- Review of Systems  Constitutional:  Negative for chills, fever, malaise/fatigue and weight loss.  HENT:  Negative for congestion, ear discharge and nosebleeds.   Eyes:  Negative for blurred vision.  Respiratory:  Negative for cough, hemoptysis, sputum production, shortness of breath and wheezing.   Cardiovascular:  Negative for chest pain, palpitations, orthopnea and claudication.  Gastrointestinal:  Negative for abdominal pain, blood in stool, constipation, diarrhea, heartburn, melena, nausea and vomiting.  Genitourinary:  Negative for dysuria, flank pain, frequency, hematuria and urgency.  Musculoskeletal:  Negative for back pain, joint pain and myalgias.       Bilateral leg pain  Skin:  Negative for rash.  Neurological:  Negative for dizziness, tingling, focal weakness, seizures, weakness and headaches.  Endo/Heme/Allergies:  Does not bruise/bleed easily.  Psychiatric/Behavioral:  Negative for depression and suicidal ideas. The patient does not have insomnia.       Allergies  Allergen Reactions   Ibrutinib Nausea Only     Past Medical History:  Diagnosis Date   Anemia    prev bone marrow bx 02/11/10   Anxiety    Bone cancer ([place])    Colitis 2004   Colon polyps    Depression    Dysphagia    GERD (gastroesophageal reflux disease)    Need for prophylactic vaccination and inoculation against influenza 01/31/2013   Neuropathy    Sleep apnea 09-12-10   Uses CPAP   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[facility])      Past Surgical History:  Procedure Laterality Date   COLONOSCOPY  03/17/2011   Procedure: COLONOSCOPY;  Surgeon: Jeryl Columbia, MD;  Location: [facility]  ENDOSCOPY;  Service: Endoscopy;  Laterality: N/A;   COLONOSCOPY WITH PROPOFOL N/A 10/07/2016   Procedure: COLONOSCOPY WITH PROPOFOL;  Surgeon: Manya Silvas, MD;  Location: [facility] ENDOSCOPY;  Service: Endoscopy;  Laterality: N/A;    ESOPHAGOGASTRODUODENOSCOPY (EGD) WITH PROPOFOL N/A 10/07/2016   Procedure: ESOPHAGOGASTRODUODENOSCOPY (EGD) WITH PROPOFOL;  Surgeon: Manya Silvas, MD;  Location: [facility] ENDOSCOPY;  Service: Endoscopy;  Laterality: N/A;   ESOPHAGOGASTRODUODENOSCOPY (EGD) WITH PROPOFOL N/A 01/24/2018   Procedure: ESOPHAGOGASTRODUODENOSCOPY (EGD) WITH PROPOFOL with Gastric Mapping;  Surgeon: Jonathon Bellows, MD;  Location: [facility] ENDOSCOPY;  Service: Gastroenterology;  Laterality: N/A;   HIP FRACTURE SURGERY  January 2013   R hip   HIP FRACTURE SURGERY Right 02/2016   NASAL FRACTURE SURGERY     ROBOTIC ASSISTED LAPAROSCOPIC CHOLECYSTECTOMY-MULTI SITE N/A 03/17/2018   Procedure: ROBOTIC ASSISTED LAPAROSCOPIC CHOLECYSTECTOMY-MULTI SITE;  Surgeon: Jules Husbands, MD;  Location: [facility] ORS;  Service: General;  Laterality: N/A;    Social History   Socioeconomic History   Marital status: Married    Spouse name: Not on file   Number of children: Not on file   Years of education: Not on file   Highest education level: Not on file  Occupational History   Not on file  Tobacco Use   Smoking status: Former    Types: Cigarettes    Quit date: 04/14/2007    Years since quitting: 14.9   Smokeless tobacco: Never  Substance and Sexual Activity   Alcohol use: No   Drug use: No   Sexual activity: Not on file  Other Topics Concern   Not on file  Social History Narrative   Not on file   Social Determinants of Health   Financial Resource Strain: Not on file  Food Insecurity: Not on file  Transportation Needs: Not on file  Physical Activity: Not on file  Stress: Not on file  Social Connections: Not on file  Intimate Partner Violence: Not on file    Family History  Problem Relation Age of Onset   Prostate cancer Neg Hx    Bladder Cancer Neg Hx    Kidney cancer Neg Hx      Current Outpatient Medications:    acetaminophen (TYLENOL) 500 MG tablet, Take 500 mg by mouth every 6 (six) hours as needed., Disp: , Rfl:     pantoprazole (PROTONIX) 40 MG tablet, Take 40 mg by mouth daily. , Disp: , Rfl:    PARoxetine (PAXIL) 40 MG tablet, Take 40 mg by mouth every morning., Disp: , Rfl:   Physical exam:  Vitals:   03/25/22 1112  BP: 133/87  Pulse: 79  Temp: 97.6 F (36.4 C)  TempSrc: Tympanic  Weight: 161 lb (73 kg)  Height: '5\' 11"'$  (1.803 m)   Physical Exam Cardiovascular:     Rate and Rhythm: Normal rate and regular rhythm.     Heart sounds: Normal heart sounds.  Pulmonary:     Effort: Pulmonary effort is normal.     Breath sounds: Normal breath sounds.  Abdominal:     General: Bowel sounds are normal.     Palpations: Abdomen is soft.     Comments: No palpable hepatosplenomegaly  Lymphadenopathy:     Comments: No palpable cervical, supraclavicular, axillary or inguinal adenopathy    Skin:    General: Skin is warm and dry.  Neurological:     Mental Status: He is alert and oriented to person, place, and time.         Latest Ref Rng &  Units 03/25/2022   10:57 AM  CMP  Glucose 70 - 99 mg/dL 98   BUN 8 - 23 mg/dL 22   Creatinine 0.61 - 1.24 mg/dL 0.90   Sodium 135 - 145 mmol/L 141   Potassium 3.5 - 5.1 mmol/L 3.9   Chloride 98 - 111 mmol/L 105   CO2 22 - 32 mmol/L 26   Calcium 8.9 - 10.3 mg/dL 8.9   Total Protein 6.5 - 8.1 g/dL 7.9   Total Bilirubin 0.3 - 1.2 mg/dL 1.0   Alkaline Phos 38 - 126 U/L 88   AST 15 - 41 U/L 23   ALT 0 - 44 U/L 17       Latest Ref Rng & Units 03/25/2022   10:57 AM  CBC  WBC 4.0 - 10.5 K/uL 6.7   Hemoglobin 13.0 - 17.0 g/dL 13.5   Hematocrit 39.0 - 52.0 % 40.7   Platelets 150 - 400 K/uL 369     Assessment and plan- Patient is a 71 y.o. male with history of Waldenstrm's macroglobulinemia here for routine follow-up  CBC CMP are within normal limits.  No B symptoms.  No palpable hepatosplenomegaly or adenopathy.  Waldenstrm's macroglobulinemia seems to be in remission presently.  Myeloma panel is currently pending.  I will see him back in 1 year with  labs   Visit Diagnosis 1. History of 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Dr. Randa Evens, MD, MPH [facility] [phone] 03/25/2022 1:14 PM

## 2022-03-31 LAB — MULTIPLE MYELOMA PANEL, SERUM
Albumin SerPl Elph-Mcnc: 3.9 g/dL (ref 2.9–4.4)
Albumin/Glob SerPl: 1.3 (ref 0.7–1.7)
Alpha 1: 0.2 g/dL (ref 0.0–0.4)
Alpha2 Glob SerPl Elph-Mcnc: 0.8 g/dL (ref 0.4–1.0)
B-Globulin SerPl Elph-Mcnc: 1.1 g/dL (ref 0.7–1.3)
Gamma Glob SerPl Elph-Mcnc: 1 g/dL (ref 0.4–1.8)
Globulin, Total: 3.1 g/dL (ref 2.2–3.9)
IgA: 314 mg/dL (ref 61–437)
IgG (Immunoglobin G), Serum: 855 mg/dL (ref 603–1613)
IgM (Immunoglobulin M), Srm: 418 mg/dL — ABNORMAL HIGH (ref 15–143)
M Protein SerPl Elph-Mcnc: 0.4 g/dL — ABNORMAL HIGH
Total Protein ELP: 7 g/dL (ref 6.0–8.5)

## 2022-06-11 NOTE — H&P (Signed)
Pre-Procedure H&P   Patient ID: [name] is a 72 y.o. male.  Gastroenterology Provider: Annamaria Helling, DO  Referring Provider: Dawson Bills, NP PCP: Maryland Pink, MD  Date: 06/12/2022  HPI [name] is a 72 y.o. male with Waldenstrm's macroglobulinemia who presents today for Esophagogastroduodenoscopy and Colonoscopy for Gastric intestinal metaplasia surveillance; personal history colon polyps .  Patient last underwent colonoscopy in June 2018 demonstrating 1 tubular adenomatous polyp, sigmoid diverticulosis and internal hemorrhoids. Previous colonoscopies in December 2012 demonstrating 1 tubular adenoma sigmoid diverticulosis as well as tortuosity and redundancy.  2004 colonoscopy demonstrated at least 4 polyps with one being approximately 10 mm in size  Previously dealt with abdominal pain that improved status post cholecystectomy in 2019 after discovered to have biliary dyskinesia on HIDA scan.  Reports some dysphagia with peanut butter but no other solids liquids or pills.  No odynophagia.  Weight and appetite have otherwise been stable.  He does have reflux which is overall controlled on Protonix  Underwent EGD in 2018 demonstrating grade a esophagitis, gastric intestinal metaplasia but negative H. pylori.  Repeat EGD in October 2019 demonstrated gastritis duodenitis and normal esophagus.  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  Recommended 3-year repeat to evaluate for GIM once more and if negative no further.  CT April 2023 normal esophagus  Most recent lab work hemoglobin 13.5 MCV 90.8 platelets 259,000 creatinine 0.9  Right hip status post replacement   Past Medical History:  Diagnosis Date   Anemia    prev bone marrow bx 02/11/10   Anxiety    Bone cancer ([place])    Colitis 2004   Colon polyps    Depression    Dysphagia    GERD (gastroesophageal reflux  disease)    Need for prophylactic vaccination and inoculation against influenza 01/31/2013   Neuropathy    Sleep apnea 09-12-10   Uses CPAP   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[facility])     Past Surgical History:  Procedure Laterality Date   CHOLECYSTECTOMY     COLONOSCOPY  03/17/2011   Procedure: COLONOSCOPY;  Surgeon: Jeryl Columbia, MD;  Location: [facility] ENDOSCOPY;  Service: Endoscopy;  Laterality: N/A;   COLONOSCOPY WITH PROPOFOL N/A 10/07/2016   Procedure: COLONOSCOPY WITH PROPOFOL;  Surgeon: Manya Silvas, MD;  Location: [facility] ENDOSCOPY;  Service: Endoscopy;  Laterality: N/A;   ESOPHAGOGASTRODUODENOSCOPY (EGD) WITH PROPOFOL N/A 10/07/2016   Procedure: ESOPHAGOGASTRODUODENOSCOPY (EGD) WITH PROPOFOL;  Surgeon: Manya Silvas, MD;  Location: [facility] ENDOSCOPY;  Service: Endoscopy;  Laterality: N/A;   ESOPHAGOGASTRODUODENOSCOPY (EGD) WITH PROPOFOL N/A 01/24/2018   Procedure: ESOPHAGOGASTRODUODENOSCOPY (EGD) WITH PROPOFOL with Gastric Mapping;  Surgeon: Jonathon Bellows, MD;  Location: [facility] ENDOSCOPY;  Service: Gastroenterology;  Laterality: N/A;   FRACTURE SURGERY     HIP FRACTURE SURGERY  04/2011   R hip   HIP FRACTURE SURGERY Right 02/2016   JOINT REPLACEMENT     NASAL FRACTURE SURGERY     ROBOTIC ASSISTED LAPAROSCOPIC CHOLECYSTECTOMY-MULTI SITE N/A 03/17/2018   Procedure: ROBOTIC ASSISTED LAPAROSCOPIC CHOLECYSTECTOMY-MULTI SITE;  Surgeon: Jules Husbands, MD;  Location: [facility] ORS;  Service: General;  Laterality: N/A;    Family History No h/o GI disease or malignancy  Review of Systems  Constitutional:  Negative for activity change, appetite change, chills, diaphoresis, fatigue, fever and unexpected weight change.  HENT:  Negative for trouble swallowing and voice change.   Respiratory:  Negative for shortness of breath and wheezing.  Cardiovascular:  Negative for chest pain, palpitations and leg swelling.  Gastrointestinal:  Negative for abdominal distention, abdominal pain, anal bleeding,  blood in stool, constipation, diarrhea, nausea and vomiting.  Musculoskeletal:  Negative for arthralgias and myalgias.  Skin:  Negative for color change and pallor.  Neurological:  Negative for dizziness, syncope and weakness.  Psychiatric/Behavioral:  Negative for confusion. The patient is not nervous/anxious.   All other systems reviewed and are negative.    Medications No current facility-administered medications on file prior to encounter.   Current Outpatient Medications on File Prior to Encounter  Medication Sig Dispense Refill   pantoprazole (PROTONIX) 40 MG tablet Take 40 mg by mouth daily.      PARoxetine (PAXIL) 40 MG tablet Take 40 mg by mouth every morning.     acetaminophen (TYLENOL) 500 MG tablet Take 500 mg by mouth every 6 (six) hours as needed.      Pertinent medications related to GI and procedure were reviewed by me with the patient prior to the procedure   Current Facility-Administered Medications:    0.9 %  sodium chloride infusion, , Intravenous, Continuous, Annamaria Helling, DO, Last Rate: 20 mL/hr at 06/12/22 0910, 1,000 mL at 06/12/22 0910      Allergies  Allergen Reactions   Ibrutinib Nausea Only   Allergies were reviewed by me prior to the procedure  Objective   Body mass index is 22.08 kg/m. Vitals:   06/12/22 0849  BP: 131/88  Resp: 18  Temp: (!) 96.7 F (35.9 C)  TempSrc: Temporal  SpO2: 100%  Weight: 71.8 kg  Height: '5\' 11"'$  (1.803 m)     Physical Exam Vitals and nursing note reviewed.  Constitutional:      General: He is not in acute distress.    Appearance: Normal appearance. He is not ill-appearing, toxic-appearing or diaphoretic.  HENT:     Head: Normocephalic and atraumatic.     Nose: Nose normal.     Mouth/Throat:     Mouth: Mucous membranes are moist.     Pharynx: Oropharynx is clear.  Eyes:     General: No scleral icterus.    Extraocular Movements: Extraocular movements intact.  Cardiovascular:     Rate and  Rhythm: Normal rate and regular rhythm.     Heart sounds: Normal heart sounds. No murmur heard.    No friction rub. No gallop.  Pulmonary:     Effort: Pulmonary effort is normal. No respiratory distress.     Breath sounds: Normal breath sounds. No wheezing, rhonchi or rales.  Abdominal:     General: Bowel sounds are normal. There is no distension.     Palpations: Abdomen is soft.     Tenderness: There is no abdominal tenderness. There is no guarding or rebound.  Musculoskeletal:     Cervical back: Neck supple.     Right lower leg: No edema.     Left lower leg: No edema.  Skin:    General: Skin is warm and dry.     Coloration: Skin is not jaundiced or pale.  Neurological:     General: No focal deficit present.     Mental Status: He is alert and oriented to person, place, and time. Mental status is at baseline.  Psychiatric:        Mood and Affect: Mood normal.        Behavior: Behavior normal.        Thought Content: Thought content normal.  Judgment: Judgment normal.      Assessment:  Mr. IMANUEL HIRSCHFELD is a 72 y.o. male  who presents today for Esophagogastroduodenoscopy and Colonoscopy for Gastric intestinal metaplasia surveillance; personal history colon polyps .  Plan:  Esophagogastroduodenoscopy and Colonoscopy with possible intervention today  Esophagogastroduodenoscopy and Colonoscopy with possible biopsy, control of bleeding, polypectomy, and interventions as necessary has been discussed with the patient/patient representative. Informed consent was obtained from the patient/patient representative after explaining the indication, nature, and risks of the procedure including but not limited to death, bleeding, perforation, missed neoplasm/lesions, cardiorespiratory compromise, and reaction to medications. Opportunity for questions was given and appropriate answers were provided. Patient/patient representative has verbalized understanding is amenable to undergoing the  procedure.   Annamaria Helling, DO  [facility] Gastroenterology  Portions of the record may have been created with voice recognition software. Occasional wrong-word or 'sound-a-like' substitutions may have occurred due to the inherent limitations of voice recognition software.  Read the chart carefully and recognize, using context, where substitutions may have occurred.

## 2022-06-12 ENCOUNTER — Ambulatory Visit
Admission: RE | Admit: 2022-06-12 | Discharge: 2022-06-12 | Disposition: A | Discharge status: Home / Self Care | Payer: Medicare Other | Attending: Gastroenterology | Admitting: Gastroenterology

## 2022-06-12 ENCOUNTER — Ambulatory Visit: Payer: Medicare Other | Admitting: Anesthesiology

## 2022-06-12 ENCOUNTER — Encounter: Payer: Self-pay | Admitting: Gastroenterology

## 2022-06-12 ENCOUNTER — Encounter
Admission: RE | Disposition: A | Discharge status: Home / Self Care | Payer: Self-pay | Source: Home / Self Care | Attending: Gastroenterology

## 2022-06-12 DIAGNOSIS — R1310 Dysphagia, unspecified: Secondary | ICD-10-CM | POA: Diagnosis not present

## 2022-06-12 DIAGNOSIS — Z9049 Acquired absence of other specified parts of digestive tract: Secondary | ICD-10-CM | POA: Diagnosis not present

## 2022-06-12 DIAGNOSIS — K2289 Other specified disease of esophagus: Secondary | ICD-10-CM | POA: Diagnosis not present

## 2022-06-12 DIAGNOSIS — D122 Benign neoplasm of ascending colon: Secondary | ICD-10-CM | POA: Diagnosis not present

## 2022-06-12 DIAGNOSIS — K644 Residual hemorrhoidal skin tags: Secondary | ICD-10-CM | POA: Diagnosis not present

## 2022-06-12 DIAGNOSIS — G4730 Sleep apnea, unspecified: Secondary | ICD-10-CM | POA: Insufficient documentation

## 2022-06-12 DIAGNOSIS — K648 Other hemorrhoids: Secondary | ICD-10-CM | POA: Diagnosis not present

## 2022-06-12 DIAGNOSIS — K5730 Diverticulosis of large intestine without perforation or abscess without bleeding: Secondary | ICD-10-CM | POA: Insufficient documentation

## 2022-06-12 DIAGNOSIS — Z1211 Encounter for screening for malignant neoplasm of colon: Secondary | ICD-10-CM | POA: Insufficient documentation

## 2022-06-12 DIAGNOSIS — F32A Depression, unspecified: Secondary | ICD-10-CM | POA: Insufficient documentation

## 2022-06-12 DIAGNOSIS — C880 Waldenstrom macroglobulinemia: Secondary | ICD-10-CM | POA: Insufficient documentation

## 2022-06-12 DIAGNOSIS — Z86010 Personal history of colonic polyps: Secondary | ICD-10-CM | POA: Insufficient documentation

## 2022-06-12 DIAGNOSIS — K2950 Unspecified chronic gastritis without bleeding: Secondary | ICD-10-CM | POA: Diagnosis not present

## 2022-06-12 DIAGNOSIS — F419 Anxiety disorder, unspecified: Secondary | ICD-10-CM | POA: Insufficient documentation

## 2022-06-12 DIAGNOSIS — K2100 Gastro-esophageal reflux disease with esophagitis, without bleeding: Secondary | ICD-10-CM | POA: Insufficient documentation

## 2022-06-12 DIAGNOSIS — K31A0 Gastric intestinal metaplasia, unspecified: Secondary | ICD-10-CM | POA: Diagnosis not present

## 2022-06-12 DIAGNOSIS — D124 Benign neoplasm of descending colon: Secondary | ICD-10-CM | POA: Insufficient documentation

## 2022-06-12 DIAGNOSIS — Z79899 Other long term (current) drug therapy: Secondary | ICD-10-CM | POA: Insufficient documentation

## 2022-06-12 DIAGNOSIS — Z09 Encounter for follow-up examination after completed treatment for conditions other than malignant neoplasm: Secondary | ICD-10-CM | POA: Insufficient documentation

## 2022-06-12 HISTORY — PX: ESOPHAGOGASTRODUODENOSCOPY: SHX5428

## 2022-06-12 HISTORY — PX: COLONOSCOPY: SHX5424

## 2022-06-12 SURGERY — COLONOSCOPY
Anesthesia: General

## 2022-06-12 MED ORDER — LIDOCAINE HCL (CARDIAC) PF 100 MG/5ML IV SOSY
PREFILLED_SYRINGE | INTRAVENOUS | Status: DC | PRN
  Administered 2022-06-12: 100 mg via INTRAVENOUS

## 2022-06-12 MED ORDER — PROPOFOL 10 MG/ML IV BOLUS
INTRAVENOUS | Status: DC | PRN
  Administered 2022-06-12 (×8): 40 mg via INTRAVENOUS
  Administered 2022-06-12: 100 mg via INTRAVENOUS
  Administered 2022-06-12 (×2): 40 mg via INTRAVENOUS

## 2022-06-12 MED ORDER — SODIUM CHLORIDE 0.9 % IV SOLN
INTRAVENOUS | Status: DC
  Administered 2022-06-12: 1000 mL via INTRAVENOUS

## 2022-06-12 NOTE — Transfer of Care (Signed)
Immediate Anesthesia Transfer of Care Note  Patient: [name]  Procedure(s) Performed: COLONOSCOPY ESOPHAGOGASTRODUODENOSCOPY (EGD)  Patient Location: Endoscopy Unit  Anesthesia Type:General  Level of Consciousness: drowsy  Airway & Oxygen Therapy: Patient Spontanous Breathing and Patient connected to nasal cannula oxygen  Post-op Assessment: Report given to RN, Post -op Vital signs reviewed and stable, and Patient moving all extremities  Post vital signs: Reviewed and stable  Last Vitals:  Vitals Value Taken Time  BP 110/64 06/12/22 1057  Temp 36.2 C 06/12/22 1057  Pulse 75 06/12/22 1101  Resp 10 06/12/22 1101  SpO2 97 % 06/12/22 1101  Vitals shown include unvalidated device data.  Last Pain:  Vitals:   06/12/22 1057  TempSrc: Temporal  PainSc: Asleep         Complications: No notable events documented.

## 2022-06-12 NOTE — Anesthesia Preprocedure Evaluation (Addendum)
Anesthesia Evaluation  Patient identified by MRN, date of birth, ID band Patient awake    Reviewed: Allergy & Precautions, H&P , NPO status , Patient's Chart, lab work & pertinent test results  Airway Mallampati: II  TM Distance: >3 FB Neck ROM: full    Dental no notable dental hx.    Pulmonary sleep apnea , former smoker   Pulmonary exam normal        Cardiovascular negative cardio ROS Normal cardiovascular exam     Neuro/Psych  PSYCHIATRIC DISORDERS  Depression    negative neurological ROS     GI/Hepatic Neg liver ROS,GERD  Medicated,,  Endo/Other  negative endocrine ROS    Renal/GU negative Renal ROS  negative genitourinary   Musculoskeletal  (+) Arthritis ,    Abdominal Normal abdominal exam  (+)   Peds  Hematology 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Anesthesia Other Findings Past Medical History: No date: Anemia     Comment:  prev bone marrow bx 02/11/10 No date: Anxiety No date: Bone cancer [facility]) 2004: Colitis No date: Colon polyps No date: Depression No date: Dysphagia No date: GERD (gastroesophageal reflux disease) 01/31/2013: Need for prophylactic vaccination and inoculation against  influenza No date: Neuropathy 09-12-10: Sleep apnea     Comment:  Uses CPAP No date: 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[facility])  Past Surgical History: 03/17/2011: COLONOSCOPY     Comment:  Procedure: COLONOSCOPY;  Surgeon: Jeryl Columbia, MD;                Location: [facility] ENDOSCOPY;  Service: Endoscopy;  Laterality:              N/A; 10/07/2016: COLONOSCOPY WITH PROPOFOL; N/A     Comment:  Procedure: COLONOSCOPY WITH PROPOFOL;  Surgeon: Manya Silvas, MD;  [facility] ENDOSCOPY;  Service:               Endoscopy;  Laterality: N/A; 10/07/2016: ESOPHAGOGASTRODUODENOSCOPY (EGD) WITH PROPOFOL; N/A     Comment:  Procedure: ESOPHAGOGASTRODUODENOSCOPY (EGD) WITH               PROPOFOL;  Surgeon: Manya Silvas, MD;  Location:               [facility] ENDOSCOPY;  Service: Endoscopy;  Laterality: N/A; 01/24/2018: ESOPHAGOGASTRODUODENOSCOPY (EGD) WITH PROPOFOL; N/A     Comment:  Procedure: ESOPHAGOGASTRODUODENOSCOPY (EGD) WITH               PROPOFOL with Gastric Mapping;  Surgeon: Jonathon Bellows, MD;              Location: [facility] ENDOSCOPY;  Service: Gastroenterology;                Laterality: N/A; January 2013: HIP FRACTURE SURGERY     Comment:  R hip 02/2016: HIP FRACTURE SURGERY; Right No date: NASAL FRACTURE SURGERY 03/17/2018: ROBOTIC ASSISTED LAPAROSCOPIC CHOLECYSTECTOMY-MULTI SITE;  N/A     Comment:  Procedure: ROBOTIC ASSISTED LAPAROSCOPIC               CHOLECYSTECTOMY-MULTI SITE;  Surgeon: Jules Husbands, MD;              Location: [facility] ORS;  Service: General;  Laterality: N/A;     Reproductive/Obstetrics negative OB ROS                             Anesthesia Physical Anesthesia  Plan  ASA: 2  Anesthesia Plan: General   Post-op Pain Management:    Induction:   PONV Risk Score and Plan: Propofol infusion and TIVA  Airway Management Planned: Natural Airway  Additional Equipment:   Intra-op Plan:   Post-operative Plan:   Informed Consent: I have reviewed the patients History and Physical, chart, labs and discussed the procedure including the risks, benefits and alternatives for the proposed anesthesia with the patient or authorized representative who has indicated his/her understanding and acceptance.     Dental Advisory Given  Plan Discussed with: CRNA and Surgeon  Anesthesia Plan Comments:         Anesthesia Quick Evaluation

## 2022-06-12 NOTE — Interval H&P Note (Signed)
History and Physical Interval Note: Preprocedure H&P from 06/12/22  was reviewed and there was no interval change after seeing and examining the patient.  Written consent was obtained from the patient after discussion of risks, benefits, and alternatives. Patient has consented to proceed with Esophagogastroduodenoscopy and Colonoscopy with possible intervention   06/12/2022 10:08 AM  [name]  has presented today for surgery, with the diagnosis of History of colonic polyps (Z86.010) Gastroesophageal reflux disease without esophagitis (K21.9) Dysphagia, unspecified type (R13.10).  The various methods of treatment have been discussed with the patient and family. After consideration of risks, benefits and other options for treatment, the patient has consented to  Procedure(s) with comments: COLONOSCOPY (N/A) ESOPHAGOGASTRODUODENOSCOPY (EGD) (N/A) - WITH DIL as a surgical intervention.  The patient's history has been reviewed, patient examined, no change in status, stable for surgery.  I have reviewed the patient's chart and labs.  Questions were answered to the patient's satisfaction.     Annamaria Helling

## 2022-06-12 NOTE — Op Note (Signed)
[facility] Gastroenterology Patient Name: [name] Procedure Date: 06/12/2022 9:58 AM MRN: [id number] Account #: [id number] Date of Birth: 06-25-1950 Admit Type: Outpatient Age: 72 Room: [facility] ENDO ROOM 1 Gender: Male Note Status: Finalized Instrument Name: Colonscope [id number] Procedure:             Colonoscopy Indications:           High risk colon cancer surveillance: Personal history                         of colonic polyps Providers:             Rueben Bash, DO Referring MD:          Irven Easterly. Kary Kos, MD (Referring MD) Medicines:             Monitored Anesthesia Care Complications:         No immediate complications. Estimated blood loss:                         Minimal. Procedure:             Pre-Anesthesia Assessment:                        - Prior to the procedure, a History and Physical was                         performed, and patient medications and allergies were                         reviewed. The patient is competent. The risks and                         benefits of the procedure and the sedation options and                         risks were discussed with the patient. All questions                         were answered and informed consent was obtained.                         Patient identification and proposed procedure were                         verified by the physician, the nurse, the anesthetist                         and the technician in the endoscopy suite. Mental                         Status Examination: alert and oriented. Airway                         Examination: normal oropharyngeal airway and neck                         mobility. Respiratory Examination: clear to  auscultation. CV Examination: RRR, no murmurs, no S3                         or S4. Prophylactic Antibiotics: The patient does not                         require prophylactic antibiotics. Prior                          Anticoagulants: The patient has taken no anticoagulant                         or antiplatelet agents. ASA Grade Assessment: II - A                         patient with mild systemic disease. After reviewing                         the risks and benefits, the patient was deemed in                         satisfactory condition to undergo the procedure. The                         anesthesia plan was to use monitored anesthesia care                         (MAC). Immediately prior to administration of                         medications, the patient was re-assessed for adequacy                         to receive sedatives. The heart rate, respiratory                         rate, oxygen saturations, blood pressure, adequacy of                         pulmonary ventilation, and response to care were                         monitored throughout the procedure. The physical                         status of the patient was re-assessed after the                         procedure.                        After obtaining informed consent, the colonoscope was                         passed under direct vision. Throughout the procedure,                         the patient's blood pressure, pulse, and oxygen  saturations were monitored continuously. The                         Colonoscope was introduced through the anus and                         advanced to the the terminal ileum, with                         identification of the appendiceal orifice and IC                         valve. The colonoscopy was performed without                         difficulty. The patient tolerated the procedure well.                         The quality of the bowel preparation was evaluated                         using the BBPS [facility] Bowel Preparation Scale) with                         scores of: Right Colon = 3 (entire mucosa seen well                         with no residual staining,  small fragments of stool or                         opaque liquid), Transverse Colon = 3 (entire mucosa                         seen well with no residual staining, small fragments                         of stool or opaque liquid) and Left Colon = 2 (minor                         amount of residual staining, small fragments of stool                         and/or opaque liquid, but mucosa seen well). The total                         BBPS score equals 8. The quality of the bowel                         preparation was excellent. The terminal ileum,                         ileocecal valve, appendiceal orifice, and rectum were                         photographed. Findings:      Hemorrhoids were found on perianal exam.      The digital rectal exam was normal. Pertinent negatives include  normal       sphincter tone.      The terminal ileum appeared normal. Estimated blood loss: none.      Retroflexion in the right colon was performed.      Multiple small-mouthed diverticula were found in the recto-sigmoid colon       and sigmoid colon. Estimated blood loss: none.      Non-bleeding external and internal hemorrhoids were found during       retroflexion and during perianal exam. Estimated blood loss: none.      Seven sessile polyps were found in the descending colon (3) and       ascending colon (4). The polyps were 1 to 2 mm in size. These polyps       were removed with a jumbo cold forceps. Resection and retrieval were       complete. Estimated blood loss was minimal.      The exam was otherwise without abnormality on direct and retroflexion       views. Impression:            - Hemorrhoids found on perianal exam.                        - The examined portion of the ileum was normal.                        - Diverticulosis in the recto-sigmoid colon and in the                         sigmoid colon.                        - Non-bleeding external and internal hemorrhoids.                         - Seven 1 to 2 mm polyps in the descending colon and                         in the ascending colon, removed with a jumbo cold                         forceps. Resected and retrieved.                        - The examination was otherwise normal on direct and                         retroflexion views. Recommendation:        - Patient has a contact number available for                         emergencies. The signs and symptoms of potential                         delayed complications were discussed with the patient.                         Return to normal activities tomorrow. Written  discharge instructions were provided to the patient.                        - Discharge patient to home.                        - Resume previous diet.                        - Continue present medications.                        - No aspirin, ibuprofen, naproxen, or other                         non-steroidal anti-inflammatory drugs for 5 days after                         polyp removal.                        - Await pathology results.                        - Repeat colonoscopy for surveillance based on                         pathology results.                        - Return to referring physician as previously                         scheduled.                        - The findings and recommendations were discussed with                         the patient. Procedure Code(s):     --- Professional ---                        [phone], Colonoscopy, flexible; with biopsy, single or                         multiple Diagnosis Code(s):     --- Professional ---                        Z86.010, Personal history of colonic polyps                        K64.8, Other hemorrhoids                        D12.4, Benign neoplasm of descending colon                        D12.2, Benign neoplasm of ascending colon                        K57.30, Diverticulosis of large intestine without  perforation or abscess without bleeding CPT copyright 2022 American [employer]. All rights reserved. The codes documented in this report are preliminary and upon coder review may  be revised to meet current compliance requirements. Attending Participation:      I personally performed the entire procedure. Volney American, DO Annamaria Helling DO, DO 06/12/2022 10:59:09 AM This report has been signed electronically. Number of Addenda: 0 Note Initiated On: 06/12/2022 9:58 AM Scope Withdrawal Time: 0 hours 21 minutes 59 seconds  Total Procedure Duration: 0 hours 27 minutes 49 seconds  Estimated Blood Loss:  Estimated blood loss was minimal.      [facility]

## 2022-06-12 NOTE — Anesthesia Postprocedure Evaluation (Signed)
Anesthesia Post Note  Patient: [name]  Procedure(s) Performed: COLONOSCOPY ESOPHAGOGASTRODUODENOSCOPY (EGD)  Patient location during evaluation: PACU Anesthesia Type: General Level of consciousness: awake and alert Pain management: pain level controlled Vital Signs Assessment: post-procedure vital signs reviewed and stable Respiratory status: spontaneous breathing, nonlabored ventilation and respiratory function stable Cardiovascular status: blood pressure returned to baseline and stable Postop Assessment: no apparent nausea or vomiting Anesthetic complications: no   No notable events documented.   Last Vitals:  Vitals:   06/12/22 1117 06/12/22 1127  BP: (!) 123/94 128/82  Pulse: 64 66  Resp: 17 (!) 21  Temp:    SpO2: 100% 98%    Last Pain:  Vitals:   06/12/22 1127  TempSrc:   PainSc: 0-No pain                 [place]

## 2022-06-12 NOTE — Op Note (Signed)
[facility] Gastroenterology Patient Name: [name] Procedure Date: 06/12/2022 9:58 AM MRN: [id number] Account #: [id number] Date of Birth: 06/13/1950 Admit Type: Outpatient Age: 72 Room: [facility] ENDO ROOM 1 Gender: Male Note Status: Finalized Instrument Name: Upper Endoscope [id number] Procedure:             Upper GI endoscopy Indications:           Follow-up of intestinal metaplasia Providers:             Rueben Bash, DO Referring MD:          Irven Easterly. Kary Kos, MD (Referring MD) Medicines:             Monitored Anesthesia Care Complications:         No immediate complications. Estimated blood loss:                         Minimal. Procedure:             Pre-Anesthesia Assessment:                        - Prior to the procedure, a History and Physical was                         performed, and patient medications and allergies were                         reviewed. The patient is competent. The risks and                         benefits of the procedure and the sedation options and                         risks were discussed with the patient. All questions                         were answered and informed consent was obtained.                         Patient identification and proposed procedure were                         verified by the physician, the nurse, the anesthetist                         and the technician in the endoscopy suite. Mental                         Status Examination: alert and oriented. Airway                         Examination: normal oropharyngeal airway and neck                         mobility. Respiratory Examination: clear to                         auscultation. CV Examination: RRR, no murmurs, no S3  or S4. Prophylactic Antibiotics: The patient does not                         require prophylactic antibiotics. Prior                         Anticoagulants: The patient has taken no anticoagulant                          or antiplatelet agents. ASA Grade Assessment: II - A                         patient with mild systemic disease. After reviewing                         the risks and benefits, the patient was deemed in                         satisfactory condition to undergo the procedure. The                         anesthesia plan was to use monitored anesthesia care                         (MAC). Immediately prior to administration of                         medications, the patient was re-assessed for adequacy                         to receive sedatives. The heart rate, respiratory                         rate, oxygen saturations, blood pressure, adequacy of                         pulmonary ventilation, and response to care were                         monitored throughout the procedure. The physical                         status of the patient was re-assessed after the                         procedure.                        After obtaining informed consent, the endoscope was                         passed under direct vision. Throughout the procedure,                         the patient's blood pressure, pulse, and oxygen                         saturations were monitored continuously. The  Endosonoscope was introduced through the mouth, and                         advanced to the second part of duodenum. The upper GI                         endoscopy was accomplished without difficulty. The                         patient tolerated the procedure well. Findings:      The duodenal bulb, first portion of the duodenum and second portion of       the duodenum were normal. Estimated blood loss: none.      The entire examined stomach was normal. Biopsies were taken with a cold       forceps for histology. Surveillance taken for gastric intestinal       metaplasia. Antrum/Incisura and Gastric body placed in separate bottles.       Estimated blood loss  was minimal.      The Z-line was variable. tongue of salmon colored mucosa ~1cm. Biopsies       were taken with a cold forceps for histology. Estimated blood loss was       minimal.      Esophagogastric landmarks were identified: the gastroesophageal junction       was found at 45 cm from the incisors.      The exam of the esophagus was otherwise normal. Impression:            - Normal duodenal bulb, first portion of the duodenum                         and second portion of the duodenum.                        - Normal stomach. Biopsied.                        - Z-line variable. Biopsied.                        - Esophagogastric landmarks identified. Recommendation:        - Patient has a contact number available for                         emergencies. The signs and symptoms of potential                         delayed complications were discussed with the patient.                         Return to normal activities tomorrow. Written                         discharge instructions were provided to the patient.                        - Discharge patient to home.                        - Resume previous diet.                        -  Continue present medications.                        - Await pathology results.                        - Repeat upper endoscopy for surveillance based on                         pathology results.                        - Return to referring physician as previously                         scheduled.                        - The findings and recommendations were discussed with                         the patient. Procedure Code(s):     --- Professional ---                        [phone], Esophagogastroduodenoscopy, flexible,                         transoral; with biopsy, single or multiple Diagnosis Code(s):     --- Professional ---                        K22.89, Other specified disease of esophagus                        K31.A0, Gastric intestinal  metaplasia, unspecified CPT copyright 2022 American Medical Association. All rights reserved. The codes documented in this report are preliminary and upon coder review may  be revised to meet current compliance requirements. Attending Participation:      I personally performed the entire procedure. Volney American, DO Annamaria Helling DO, DO 06/12/2022 10:23:40 AM This report has been signed electronically. Number of Addenda: 0 Note Initiated On: 06/12/2022 9:58 AM Estimated Blood Loss:  Estimated blood loss was minimal.      [facility]

## 2022-06-15 ENCOUNTER — Encounter: Payer: Self-pay | Admitting: Gastroenterology

## 2022-06-15 LAB — SURGICAL PATHOLOGY

## 2022-09-16 ENCOUNTER — Other Ambulatory Visit (INDEPENDENT_AMBULATORY_CARE_PROVIDER_SITE_OTHER): Payer: Self-pay | Admitting: Nurse Practitioner

## 2022-09-16 DIAGNOSIS — I714 Abdominal aortic aneurysm, without rupture, unspecified: Secondary | ICD-10-CM

## 2022-09-25 ENCOUNTER — Ambulatory Visit (INDEPENDENT_AMBULATORY_CARE_PROVIDER_SITE_OTHER): Payer: Medicare Other | Admitting: Vascular Surgery

## 2022-09-25 ENCOUNTER — Ambulatory Visit (INDEPENDENT_AMBULATORY_CARE_PROVIDER_SITE_OTHER): Payer: Medicare Other

## 2022-09-25 DIAGNOSIS — I714 Abdominal aortic aneurysm, without rupture, unspecified: Secondary | ICD-10-CM | POA: Diagnosis not present

## 2022-12-21 ENCOUNTER — Ambulatory Visit (INDEPENDENT_AMBULATORY_CARE_PROVIDER_SITE_OTHER): Payer: Medicare Other | Admitting: Internal Medicine

## 2022-12-21 VITALS — BP 125/74 | HR 89 | Resp 14 | Ht 71.0 in | Wt 153.0 lb

## 2022-12-21 DIAGNOSIS — Z7189 Other specified counseling: Secondary | ICD-10-CM | POA: Diagnosis not present

## 2022-12-21 DIAGNOSIS — G4733 Obstructive sleep apnea (adult) (pediatric): Secondary | ICD-10-CM | POA: Diagnosis not present

## 2022-12-21 NOTE — Progress Notes (Unsigned)
[facility] [address] [place], [place] [address]  Pulmonary Sleep Medicine   Office Visit Note  Patient Name: [name] DOB: [birth year] MRN [id number]    Chief Complaint: Obstructive Sleep Apnea visit  Brief History:  [name] is seen today for an annual follow up on CPAP at 72 cmh20.  The patient has a 12 year history of sleep apnea. Patient is using PAP nightly.  The patient feels rested after sleeping with PAP.  The patient reports benefiting from PAP use. Reported sleepiness is  improved and the Epworth Sleepiness Score is 3 out of 24. The patient does not take naps. The patient complains of the following: No complaints.  The compliance download shows 100% compliance with an average use time of 8:27 hours. The AHI is 3.7.  The patient does not complain of limb movements disrupting sleep.  ROS  General: (-) fever, (-) chills, (-) night sweat Nose and Sinuses: (-) nasal stuffiness or itchiness, (-) postnasal drip, (-) nosebleeds, (-) sinus trouble. Mouth and Throat: (-) sore throat, (-) hoarseness. Neck: (-) swollen glands, (-) enlarged thyroid, (-) neck pain. Respiratory: - cough, - shortness of breath, - wheezing. Neurologic: - numbness, - tingling. Psychiatric: - anxiety, - depression   Current Medication: Outpatient Encounter Medications as of 12/21/2022  Medication Sig   acetaminophen (TYLENOL) 500 MG tablet Take 500 mg by mouth every 6 (six) hours as needed.   pantoprazole (PROTONIX) 40 MG tablet Take 40 mg by mouth daily.    PARoxetine (PAXIL) 40 MG tablet Take 40 mg by mouth every morning.   No facility-administered encounter medications on file as of 12/21/2022.    Surgical History: Past Surgical History:  Procedure Laterality Date   CHOLECYSTECTOMY     COLONOSCOPY  03/17/2011   Procedure: COLONOSCOPY;  Surgeon: Petra Kuba, MD;  Location: [facility] ENDOSCOPY;  Service: Endoscopy;  Laterality: N/A;   COLONOSCOPY N/A 06/12/2022   Procedure:  COLONOSCOPY;  Surgeon: Jaynie Collins, DO;  Location: [facility] ENDOSCOPY;  Service: Gastroenterology;  Laterality: N/A;   COLONOSCOPY WITH PROPOFOL N/A 10/07/2016   Procedure: COLONOSCOPY WITH PROPOFOL;  Surgeon: Scot Jun, MD;  Location: [facility] ENDOSCOPY;  Service: Endoscopy;  Laterality: N/A;   ESOPHAGOGASTRODUODENOSCOPY N/A 06/12/2022   Procedure: ESOPHAGOGASTRODUODENOSCOPY (EGD);  Surgeon: Jaynie Collins, DO;  Location: [facility] ENDOSCOPY;  Service: Gastroenterology;  Laterality: N/A;  WITH DIL   ESOPHAGOGASTRODUODENOSCOPY (EGD) WITH PROPOFOL N/A 10/07/2016   Procedure: ESOPHAGOGASTRODUODENOSCOPY (EGD) WITH PROPOFOL;  Surgeon: Scot Jun, MD;  Location: [facility] ENDOSCOPY;  Service: Endoscopy;  Laterality: N/A;   ESOPHAGOGASTRODUODENOSCOPY (EGD) WITH PROPOFOL N/A 01/24/2018   Procedure: ESOPHAGOGASTRODUODENOSCOPY (EGD) WITH PROPOFOL with Gastric Mapping;  Surgeon: Wyline Mood, MD;  Location: [facility] ENDOSCOPY;  Service: Gastroenterology;  Laterality: N/A;   FRACTURE SURGERY     HIP FRACTURE SURGERY  04/2011   R hip   HIP FRACTURE SURGERY Right 02/2016   JOINT REPLACEMENT     NASAL FRACTURE SURGERY     ROBOTIC ASSISTED LAPAROSCOPIC CHOLECYSTECTOMY-MULTI SITE N/A 03/17/2018   Procedure: ROBOTIC ASSISTED LAPAROSCOPIC CHOLECYSTECTOMY-MULTI SITE;  Surgeon: Leafy Ro, MD;  Location: [facility] ORS;  Service: General;  Laterality: N/A;    Medical History: Past Medical History:  Diagnosis Date   Anemia    prev bone marrow bx 02/11/10   Anxiety    Bone cancer (HCC)    Colitis 2004   Colon polyps    Depression    Dysphagia    GERD (gastroesophageal reflux disease)  Need for prophylactic vaccination and inoculation against influenza 01/31/2013   Neuropathy    Sleep apnea 09-12-10   Uses CPAP   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HCC)     Family History: Non contributory to the present illness  Social History: Social History   Socioeconomic History   Marital status:  Married    Spouse name: Not on file   Number of children: Not on file   Years of education: Not on file   Highest education level: Not on file  Occupational History   Not on file  Tobacco Use   Smoking status: Former    Current packs/day: 0.00    Types: Cigarettes    Quit date: 04/14/2007    Years since quitting: 15.6   Smokeless tobacco: Never  Vaping Use   Vaping status: Never Used  Substance and Sexual Activity   Alcohol use: No   Drug use: No   Sexual activity: Not on file  Other Topics Concern   Not on file  Social History Narrative   Not on file   Social Determinants of Health   Financial Resource Strain: Patient Declined (02/09/2022)   Received from [facility]   Overall Financial Resource Strain (CARDIA)    Difficulty of Paying Living Expenses: Patient declined  Food Insecurity: Patient Declined (02/09/2022)   Received from [facility]   Hunger Vital Sign    Worried About Running Out of Food in the Last Year: Patient declined    Ran Out of Food in the Last Year: Patient declined  Transportation Needs: Patient Declined (02/09/2022)   Received from [facility]   PRAPARE - Transportation    In the past 12 months, has lack of transportation kept you from medical appointments or from getting medications?: Patient declined    Lack of Transportation (Non-Medical): Patient declined  Physical Activity: Not on file  Stress: Not on file  Social Connections: Not on file  Intimate Partner Violence: Not on file    Vital Signs: There were no vitals taken for this visit. There is no height or weight on file to calculate BMI.    Examination: General Appearance: The patient is well-developed, well-nourished, and in no distress. Neck Circumference: 39 cm Skin: Gross inspection of skin unremarkable. Head: normocephalic, no gross deformities. Eyes: no gross deformities noted. ENT: ears appear grossly normal Neurologic:  Alert and oriented. No involuntary movements.  STOP BANG RISK ASSESSMENT S (snore) Have you been told that you snore?     NO   T (tired) Are you often tired, fatigued, or sleepy during the day?   NO  O (obstruction) Do you stop breathing, choke, or gasp during sleep? NO   P (pressure) Do you have or are you being treated for high blood pressure? NO   B (BMI) Is your body index greater than 35 kg/m? NO   A (age) Are you 41 years old or older? YES   N (neck) Do you have a neck circumference greater than 16 inches?   NO   G (gender) Are you a male? YES   TOTAL STOP/BANG "YES" ANSWERS 2       A STOP-Bang score of 2 or less is considered low risk, and a score of 5 or more is high risk for having either moderate or severe OSA. For people who score 3 or 4, doctors may need to perform further assessment to determine how likely they are to have OSA.  EPWORTH SLEEPINESS SCALE:  Scale:  (0)= no chance of dozing; (1)= slight chance of dozing; (2)= moderate chance of dozing; (3)= high chance of dozing  Chance  Situtation    Sitting and reading: 0    Watching TV: 1    Sitting Inactive in public: 0    As a passenger in car: 0      Lying down to rest: 1    Sitting and talking: 0    Sitting quielty after lunch: 1    In a car, stopped in traffic: 0   TOTAL SCORE:   3 out of 24    SLEEP STUDIES:  PSG (10/04/10) AHI 16.3, min SPO2 82%   CPAP COMPLIANCE DATA:  Date Range: 12/17/21 - 12/16/22  Average Daily Use: 8:27 hours  Median Use: 8:31 hours  Compliance for > 4 Hours: 365 days  AHI: 3.7 respiratory events per hour  Days Used: 365/365  Mask Leak: 46.5  95th Percentile Pressure: 9 cmh20         LABS: No results found for this or any previous visit (from the past 2160 hour(s)).  Radiology: No results found.  No results found.  No results found.    Assessment and Plan: Patient Active Problem List   Diagnosis Date Noted   OSA on CPAP  12/22/2021   CPAP use counseling 12/22/2021   History of right hip replacement 09/23/2021   Anxiety 09/23/2021   Encounter for immunization 09/23/2021   Other specified cataract 09/23/2021   Preglaucoma, unspecified, bilateral 09/23/2021   Unilateral primary osteoarthritis, right knee 09/23/2021   Gastro-esophageal reflux disease without esophagitis 09/23/2021   Gastroesophageal reflux disease 09/23/2021   Pain in right knee 09/23/2021   History of total hip arthroplasty 04/08/2018   Biliary dyskinesia    Right hip pain 07/10/2017   Diverticulosis of sigmoid colon 02/12/2017   Influenza vaccine administered 01/25/2017   AAA (abdominal aortic aneurysm) without rupture (HCC) 01/13/2017   Prostatitis 01/12/2017   Aortic atherosclerosis (HCC) 01/12/2017   Urinary dysfunction 12/11/2016   Testicular asymmetry 12/11/2016   Enteritis 12/11/2016   Diarrhea, functional 12/11/2016   Abnormal CT scan, gastrointestinal tract 12/11/2016   Abdominal pain, diffuse 12/11/2016   Erosive gastritis 12/03/2016   Bilateral lower abdominal pain 12/03/2016   History of colonic polyps 07/27/2016   Abnormal CAT scan 09/17/2013   Abnormal CT scan, chest 09/17/2013   Waldenstrom macroglobulinemia (HCC) 09/16/2013   Need for prophylactic vaccination and inoculation against influenza 01/31/2013   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HCC)    GERD (gastroesophageal reflux disease)     1. OSA on CPAP The patient does tolerate PAP and reports  benefit from PAP use. The patient was reminded how to clean equipment and advised to replace supplies routinely. The patient was also counselled on mask leak. The compliance is excellent. The AHI is 3.7.   OSA on cpap- controlled. Continue with excellent compliance with pap. CPAP continues to be medically necessary to treat this patient's OSA. F/u one year.    2. CPAP use counseling CPAP Counseling: had a lengthy discussion with the patient regarding the importance of  PAP therapy in management of the sleep apnea. Patient appears to understand the risk factor reduction and also understands the risks associated with untreated sleep apnea. Patient will try to make a good faith effort to remain compliant with therapy. Also instructed the patient on proper cleaning of the device including the water must be changed daily if possible and use of distilled water is  preferred. Patient understands that the machine should be regularly cleaned with appropriate recommended cleaning solutions that do not damage the PAP machine for example given white vinegar and water rinses. Other methods such as ozone treatment may not be as good as these simple methods to achieve cleaning.     General Counseling: I have discussed the findings of the evaluation and examination with Chrissie Noa.  I have also discussed any further diagnostic evaluation thatmay be needed or ordered today. Mickal verbalizes understanding of the findings of todays visit. We also reviewed his medications today and discussed drug interactions and side effects including but not limited excessive drowsiness and altered mental states. We also discussed that there is always a risk not just to him but also people around him. he has been encouraged to call the office with any questions or concerns that should arise related to todays visit.  No orders of the defined types were placed in this encounter.       I have personally obtained a history, examined the patient, evaluated laboratory and imaging results, formulated the assessment and plan and placed orders. This patient was seen today by Emmaline Kluver, PA-C in collaboration with Dr. Freda Munro.   Yevonne Pax, MD [facility] Diplomate ABMS Pulmonary Critical Care Medicine and Sleep Medicine

## 2022-12-21 NOTE — Patient Instructions (Signed)

## 2023-01-01 ENCOUNTER — Emergency Department: Payer: Medicare Other

## 2023-01-01 ENCOUNTER — Emergency Department
Admission: EM | Admit: 2023-01-01 | Discharge: 2023-01-01 | Disposition: A | Discharge status: Home / Self Care | Payer: Medicare Other | Attending: Emergency Medicine | Admitting: Emergency Medicine

## 2023-01-01 ENCOUNTER — Other Ambulatory Visit: Payer: Self-pay

## 2023-01-01 ENCOUNTER — Encounter: Payer: Self-pay | Admitting: Emergency Medicine

## 2023-01-01 DIAGNOSIS — R058 Other specified cough: Secondary | ICD-10-CM

## 2023-01-01 DIAGNOSIS — R0981 Nasal congestion: Secondary | ICD-10-CM

## 2023-01-01 DIAGNOSIS — B9789 Other viral agents as the cause of diseases classified elsewhere: Secondary | ICD-10-CM | POA: Insufficient documentation

## 2023-01-01 DIAGNOSIS — J069 Acute upper respiratory infection, unspecified: Secondary | ICD-10-CM | POA: Diagnosis not present

## 2023-01-01 DIAGNOSIS — Z20822 Contact with and (suspected) exposure to covid-19: Secondary | ICD-10-CM | POA: Diagnosis not present

## 2023-01-01 DIAGNOSIS — J40 Bronchitis, not specified as acute or chronic: Secondary | ICD-10-CM | POA: Diagnosis not present

## 2023-01-01 DIAGNOSIS — R0602 Shortness of breath: Secondary | ICD-10-CM

## 2023-01-01 DIAGNOSIS — R509 Fever, unspecified: Secondary | ICD-10-CM | POA: Diagnosis present

## 2023-01-01 LAB — SARS CORONAVIRUS 2 BY RT PCR: SARS Coronavirus 2 by RT PCR: NEGATIVE

## 2023-01-01 LAB — CBC WITH DIFFERENTIAL/PLATELET
Abs Immature Granulocytes: 0.02 10*3/uL (ref 0.00–0.07)
Basophils Absolute: 0 10*3/uL (ref 0.0–0.1)
Basophils Relative: 1 %
Eosinophils Absolute: 0.1 10*3/uL (ref 0.0–0.5)
Eosinophils Relative: 2 %
HCT: 39.5 % (ref 39.0–52.0)
Hemoglobin: 13 g/dL (ref 13.0–17.0)
Immature Granulocytes: 0 %
Lymphocytes Relative: 19 %
Lymphs Abs: 1.2 10*3/uL (ref 0.7–4.0)
MCH: 30.3 pg (ref 26.0–34.0)
MCHC: 32.9 g/dL (ref 30.0–36.0)
MCV: 92.1 fL (ref 80.0–100.0)
Monocytes Absolute: 0.8 10*3/uL (ref 0.1–1.0)
Monocytes Relative: 12 %
Neutro Abs: 4.4 10*3/uL (ref 1.7–7.7)
Neutrophils Relative %: 66 %
Platelets: 345 10*3/uL (ref 150–400)
RBC: 4.29 MIL/uL (ref 4.22–5.81)
RDW: 14 % (ref 11.5–15.5)
WBC: 6.6 10*3/uL (ref 4.0–10.5)
nRBC: 0 % (ref 0.0–0.2)

## 2023-01-01 LAB — TROPONIN I (HIGH SENSITIVITY)
Troponin I (High Sensitivity): 4 ng/L (ref <18)
Troponin I (High Sensitivity): 4 ng/L (ref <18)

## 2023-01-01 LAB — COMPREHENSIVE METABOLIC PANEL
ALT: 29 U/L (ref 0–44)
AST: 24 U/L (ref 15–41)
Albumin: 4 g/dL (ref 3.5–5.0)
Alkaline Phosphatase: 80 U/L (ref 38–126)
Anion gap: 11 (ref 5–15)
BUN: 25 mg/dL — ABNORMAL HIGH (ref 8–23)
CO2: 24 mmol/L (ref 22–32)
Calcium: 8.9 mg/dL (ref 8.9–10.3)
Chloride: 106 mmol/L (ref 98–111)
Creatinine, Ser: 1.01 mg/dL (ref 0.61–1.24)
GFR, Estimated: >60 mL/min (ref >60)
Glucose, Bld: 109 mg/dL — ABNORMAL HIGH (ref 70–99)
Potassium: 3.8 mmol/L (ref 3.5–5.1)
Sodium: 141 mmol/L (ref 135–145)
Total Bilirubin: 1.6 mg/dL — ABNORMAL HIGH (ref 0.3–1.2)
Total Protein: 6.9 g/dL (ref 6.5–8.1)

## 2023-01-01 NOTE — ED Provider Notes (Signed)
[facility] Provider Note   Event Date/Time   First MD Initiated Contact with Patient 01/01/23 [phone]     (approximate) History  Shortness of Breath  HPI [name] is a 72 y.o. male with a past medical history of GERD, anxiety/depression, and Waldenstrm's macroglobulinemia who presents complaining of of nasal congestion, chest congestion, clear rhinorrhea, and cough productive of white sputum in addition to subjective fever and chills.  Patient states that his wife has been "in and out of the hospital" and believes that he may have been infected with something that she has had.  Patient does not know what his wife has been diagnosed with.  Patient endorses associated sore throat, shortness of breath and dyspnea on exertion since his symptoms began 2 days prior to arrival.  Patient has not tried taking any medications for the symptoms. ROS: Patient currently denies any vision changes, tinnitus, difficulty speaking, facial droop, chest pain, abdominal pain, nausea/vomiting/diarrhea, dysuria, or weakness/numbness/paresthesias in any extremity   Physical Exam  Triage Vital Signs: ED Triage Vitals  Encounter Vitals Group     BP 01/01/23 0351 139/80     Systolic BP Percentile --      Diastolic BP Percentile --      Pulse Rate 01/01/23 0351 89     Resp 01/01/23 0351 20     Temp 01/01/23 0351 98.1 F (36.7 C)     Temp src --      SpO2 01/01/23 0351 100 %     Weight 01/01/23 0348 158 lb (71.7 kg)     Height 01/01/23 0348 5\' 11"  (1.803 m)     Head Circumference --      Peak Flow --      Pain Score 01/01/23 0353 0     Pain Loc --      Pain Education --      Exclude from Growth Chart --    Most recent vital signs: Vitals:   01/01/23 0351  BP: 139/80  Pulse: 89  Resp: 20  Temp: 98.1 F (36.7 C)  SpO2: 100%   General: Awake, oriented x4. CV:  Good peripheral perfusion.  Resp:  Normal effort.  Clear to auscultation bilaterally Abd:  No distention.   Other:  Elderly well-developed, well-nourished Caucasian male resting comfortably in no acute distress ED Results / Procedures / Treatments  Labs (all labs ordered are listed, but only abnormal results are displayed) Labs Reviewed  COMPREHENSIVE METABOLIC PANEL - Abnormal; Notable for the following components:      Result Value   Glucose, Bld 109 (*)    BUN 25 (*)    Total Bilirubin 1.6 (*)    All other components within normal limits  SARS CORONAVIRUS 2 BY RT PCR  CBC WITH DIFFERENTIAL/PLATELET  TROPONIN I (HIGH SENSITIVITY)  TROPONIN I (HIGH SENSITIVITY)   EKG ED ECG REPORT I, Merwyn Katos, the attending physician, personally viewed and interpreted this ECG. Date: 01/01/2023 EKG Time: 0353 Rate: 90 Rhythm: normal sinus rhythm QRS Axis: normal Intervals: normal ST/T Wave abnormalities: normal Narrative Interpretation: no evidence of acute ischemia RADIOLOGY ED MD interpretation: 2 view chest x-ray interpreted by me shows no evidence of acute abnormalities including no pneumonia, pneumothorax, or widened mediastinum -Agree with radiology assessment Official radiology report(s): DG Chest 2 View  Result Date: 01/01/2023 CLINICAL DATA:  72 year old male with shortness of breath and cough for 5 days. EXAM: CHEST - 2 VIEW COMPARISON:  CTA chest 07/22/2021 and earlier. FINDINGS:  PA and lateral views 0358 hours today. Chronic apical lung scarring. Chronic pulmonary hyperinflation/that chronic pulmonary hyperinflation and apical lung scarring appears stable from prior exams. Mediastinal contours remain normal. Visualized tracheal air column is within normal limits. Coarse chronic increased pulmonary interstitium is stable since 2019 radiographs. Bronchiectasis by CT. No pneumothorax, pulmonary edema, pleural effusion or acute pulmonary opacity. Chronic right clavicle deformity. Osteopenia in the spine. No acute osseous abnormality identified. Negative visible bowel gas. IMPRESSION:  Chronic lung disease with hyperinflation, Bronchiectasis, scarring. No acute cardiopulmonary abnormality identified. Electronically Signed   By: Odessa Fleming M.D.   On: 01/01/2023 04:55   PROCEDURES: Critical Care performed: No .1-3 Lead EKG Interpretation  Performed by: Merwyn Katos, MD Authorized by: Merwyn Katos, MD     Interpretation: normal     ECG rate:  81   ECG rate assessment: normal     Rhythm: sinus rhythm     Ectopy: none     Conduction: normal    MEDICATIONS ORDERED IN ED: Medications - No data to display IMPRESSION / MDM / ASSESSMENT AND PLAN / ED COURSE  I reviewed the triage vital signs and the nursing notes.                             The patient is on the cardiac monitor to evaluate for evidence of arrhythmia and/or significant heart rate changes. Patient's presentation is most consistent with acute presentation with potential threat to life or bodily function. Otherwise healthy patient presenting with constellation of symptoms likely representing uncomplicated viral upper respiratory symptoms as characterized by mild pharyngitis, clear rhinorrhea, subjective fevers  Unlikely PTA/RPA: no hot potato voice, no uvular deviation, Unlikely Esophageal rupture: No history of dysphagia Unlikely deep space infection/Ludwig's Low suspicion for CNS infection bacterial sinusitis, or pneumonia given exam and history.  Unlikely Strep or EBV as centor negative and with no pharyngeal exudate, posterior LAD, or splenomegaly.  Will attempt to alleviate symptoms conservatively; no overt indications at this time for antibiotics. No respiratory distress, otherwise relatively well appearing and nontoxic. Will discuss prompt follow up with PMD and strict return precautions.   FINAL CLINICAL IMPRESSION(S) / ED DIAGNOSES   Final diagnoses:  Viral upper respiratory tract infection  Nasal sinus congestion  Productive cough  Bronchitis  Shortness of breath   Rx / DC Orders   ED  Discharge Orders     None      Note:  This document was prepared using Dragon voice recognition software and may include unintentional dictation errors.   Merwyn Katos, MD 01/01/23 [phone]

## 2023-01-01 NOTE — Discharge Instructions (Addendum)
Please use OTC decongestants especially before using your CPAP machine. Because you don't have high blood pressure, you may use any over -the-counter decongestants including guaifenesin, phenylephrine, dextromethorphan, or pseudoephedrine

## 2023-01-01 NOTE — ED Triage Notes (Addendum)
Patient ambulatory to triage with steady gait, without difficulty or distress noted; pt reports prod cough clear sputum, sinus congestion since Monday; denies pain but st some SHOB; st that his wife has been back and forth to doctor recently and believes he may have caught something

## 2023-01-04 ENCOUNTER — Emergency Department: Payer: Medicare Other

## 2023-01-04 ENCOUNTER — Emergency Department
Admission: EM | Admit: 2023-01-04 | Discharge: 2023-01-04 | Disposition: A | Discharge status: Home / Self Care | Payer: Medicare Other | Attending: Emergency Medicine | Admitting: Emergency Medicine

## 2023-01-04 ENCOUNTER — Other Ambulatory Visit: Payer: Self-pay

## 2023-01-04 DIAGNOSIS — R1030 Lower abdominal pain, unspecified: Secondary | ICD-10-CM

## 2023-01-04 DIAGNOSIS — R1031 Right lower quadrant pain: Secondary | ICD-10-CM | POA: Diagnosis present

## 2023-01-04 LAB — COMPREHENSIVE METABOLIC PANEL
ALT: 24 U/L (ref 0–44)
AST: 21 U/L (ref 15–41)
Albumin: 4.2 g/dL (ref 3.5–5.0)
Alkaline Phosphatase: 90 U/L (ref 38–126)
Anion gap: 10 (ref 5–15)
BUN: 23 mg/dL (ref 8–23)
CO2: 26 mmol/L (ref 22–32)
Calcium: 9 mg/dL (ref 8.9–10.3)
Chloride: 104 mmol/L (ref 98–111)
Creatinine, Ser: 0.82 mg/dL (ref 0.61–1.24)
GFR, Estimated: >60 mL/min (ref >60)
Glucose, Bld: 88 mg/dL (ref 70–99)
Potassium: 3.9 mmol/L (ref 3.5–5.1)
Sodium: 140 mmol/L (ref 135–145)
Total Bilirubin: 1.5 mg/dL — ABNORMAL HIGH (ref 0.3–1.2)
Total Protein: 7.6 g/dL (ref 6.5–8.1)

## 2023-01-04 LAB — URINALYSIS, ROUTINE W REFLEX MICROSCOPIC
Bilirubin Urine: NEGATIVE
Glucose, UA: NEGATIVE mg/dL
Hgb urine dipstick: NEGATIVE
Ketones, ur: NEGATIVE mg/dL
Leukocytes,Ua: NEGATIVE
Nitrite: NEGATIVE
Protein, ur: NEGATIVE mg/dL
Specific Gravity, Urine: 1.005 (ref 1.005–1.030)
pH: 6 (ref 5.0–8.0)

## 2023-01-04 LAB — CBC
HCT: 38.6 % — ABNORMAL LOW (ref 39.0–52.0)
Hemoglobin: 13.3 g/dL (ref 13.0–17.0)
MCH: 30.7 pg (ref 26.0–34.0)
MCHC: 34.5 g/dL (ref 30.0–36.0)
MCV: 89.1 fL (ref 80.0–100.0)
Platelets: 366 10*3/uL (ref 150–400)
RBC: 4.33 MIL/uL (ref 4.22–5.81)
RDW: 13.8 % (ref 11.5–15.5)
WBC: 6 10*3/uL (ref 4.0–10.5)
nRBC: 0 % (ref 0.0–0.2)

## 2023-01-04 LAB — LIPASE, BLOOD: Lipase: 37 U/L (ref 11–51)

## 2023-01-04 MED ORDER — IOHEXOL 300 MG/ML  SOLN
100.0000 mL | Freq: Once | INTRAMUSCULAR | Status: AC | PRN
  Administered 2023-01-04: 100 mL via INTRAVENOUS

## 2023-01-04 NOTE — ED Triage Notes (Signed)
Pt POV with steady gait. Pt back for mild abd pain and nausea. Denies having vomited and is able to keep done PO intake. Stated he was dizzy getting up this morning but not currently.

## 2023-01-04 NOTE — ED Provider Notes (Signed)
[facility] Provider Note    Event Date/Time   First MD Initiated Contact with Patient 01/04/23 1444     (approximate)   History   Abdominal Pain   HPI  [name] is a 72 y.o. male with a past medical history of anxiety, AAA (3.4 cm at the time of most recent vascular surgery visit 09/23/2021) erosive gastritis, GERD who presents today for evaluation of periumbilical abdominal pain and right lower quadrant pain that has been intermittent for the past few days.  He reports that he has had this pain before "it is never anything."  He denies any chest pain, back pain, dizziness, fevers, chills, dysuria, nausea, vomiting.  He has not had any changes in his stool.  Patient Active Problem List   Diagnosis Date Noted   OSA on CPAP 12/22/2021   CPAP use counseling 12/22/2021   History of right hip replacement 09/23/2021   Anxiety 09/23/2021   Encounter for immunization 09/23/2021   Other specified cataract 09/23/2021   Preglaucoma, unspecified, bilateral 09/23/2021   Unilateral primary osteoarthritis, right knee 09/23/2021   Gastro-esophageal reflux disease without esophagitis 09/23/2021   Gastroesophageal reflux disease 09/23/2021   Pain in right knee 09/23/2021   History of total hip arthroplasty 04/08/2018   Biliary dyskinesia    Right hip pain 07/10/2017   Diverticulosis of sigmoid colon 02/12/2017   Influenza vaccine administered 01/25/2017   AAA (abdominal aortic aneurysm) without rupture (HCC) 01/13/2017   Prostatitis 01/12/2017   Aortic atherosclerosis (HCC) 01/12/2017   Urinary dysfunction 12/11/2016   Testicular asymmetry 12/11/2016   Enteritis 12/11/2016   Diarrhea, functional 12/11/2016   Abnormal CT scan, gastrointestinal tract 12/11/2016   Abdominal pain, diffuse 12/11/2016   Erosive gastritis 12/03/2016   Bilateral lower abdominal pain 12/03/2016   History of colonic polyps 07/27/2016   Abnormal CAT scan 09/17/2013   Abnormal CT  scan, chest 09/17/2013   Waldenstrom macroglobulinemia (HCC) 09/16/2013   Need for prophylactic vaccination and inoculation against influenza 01/31/2013   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HCC)    GERD (gastroesophageal reflux disease)           Physical Exam   Triage Vital Signs: ED Triage Vitals  Encounter Vitals Group     BP 01/04/23 1201 109/72     Systolic BP Percentile --      Diastolic BP Percentile --      Pulse Rate 01/04/23 1205 83     Resp 01/04/23 1201 20     Temp 01/04/23 1201 (!) 97.5 F (36.4 C)     Temp src --      SpO2 01/04/23 1201 98 %     Weight 01/04/23 1202 156 lb (70.8 kg)     Height 01/04/23 1202 5\' 11"  (1.803 m)     Head Circumference --      Peak Flow --      Pain Score 01/04/23 1202 4     Pain Loc --      Pain Education --      Exclude from Growth Chart --     Most recent vital signs: Vitals:   01/04/23 1205 01/04/23 1821  BP:  133/81  Pulse: 83 73  Resp:  17  Temp:    SpO2:  100%    Physical Exam Vitals and nursing note reviewed.  Constitutional:      General: Awake and alert. No acute distress.    Appearance: Normal appearance. The patient is normal weight.  HENT:  Head: Normocephalic and atraumatic.     Mouth: Mucous membranes are moist.  Eyes:     General: PERRL. Normal EOMs        Right eye: No discharge.        Left eye: No discharge.     Conjunctiva/sclera: Conjunctivae normal.  Cardiovascular:     Rate and Rhythm: Normal rate and regular rhythm.     Pulses: Normal pulses.  Pulmonary:     Effort: Pulmonary effort is normal. No respiratory distress.     Breath sounds: Normal breath sounds.  Abdominal:     Abdomen is soft. There is mild lower abdominal tenderness. No rebound or guarding. No distention. Musculoskeletal:        General: No swelling. Normal range of motion.     Cervical back: Normal range of motion and neck supple.  Skin:    General: Skin is warm and dry.     Capillary Refill: Capillary refill takes  less than 2 seconds.     Findings: No rash.  Neurological:     Mental Status: The patient is awake and alert.      ED Results / Procedures / Treatments   Labs (all labs ordered are listed, but only abnormal results are displayed) Labs Reviewed  COMPREHENSIVE METABOLIC PANEL - Abnormal; Notable for the following components:      Result Value   Total Bilirubin 1.5 (*)    All other components within normal limits  CBC - Abnormal; Notable for the following components:   HCT 38.6 (*)    All other components within normal limits  URINALYSIS, ROUTINE W REFLEX MICROSCOPIC - Abnormal; Notable for the following components:   Color, Urine STRAW (*)    APPearance CLEAR (*)    All other components within normal limits  LIPASE, BLOOD     EKG     RADIOLOGY I independently reviewed and interpreted imaging and agree with radiologists findings.     PROCEDURES:  Critical Care performed:   Procedures   MEDICATIONS ORDERED IN ED: Medications  iohexol (OMNIPAQUE) 300 MG/ML solution 100 mL (100 mLs Intravenous Contrast Given 01/04/23 1554)     IMPRESSION / MDM / ASSESSMENT AND PLAN / ED COURSE  I reviewed the triage vital signs and the nursing notes.   Differential diagnosis includes, but is not limited to, appendicitis, gastritis, diverticulitis, less likely AAA.  Patient is awake and alert, hemodynamically stable and afebrile.  He is nontoxic in appearance.  I reviewed the patient's chart.  Patient was seen by vascular surgery in June 2023 at which time he had an infrarenal AAA of 3.4 cm.  Labs obtained in triage are overall reassuring.  No leukocytosis.  CT scan was obtained for further evaluation and is also unrevealing for any acute findings.  Upon reevaluation, patient reports that his pain has resolved.  He is nauseous.  He reports he feels back to his baseline.  Recommended close outpatient follow-up and strict return precautions.  Patient understands and agrees with  plan.   Patient's presentation is most consistent with acute complicated illness / injury requiring diagnostic workup.   Clinical Course as of 01/04/23 1841  Mon Jan 04, 2023  1759 Upon reevaluation, patient reports that he feels improved and has no pain or nausea at this time [JP]    Clinical Course User Index [JP] Mackenzie Groom, Herb Grays, PA-C     FINAL CLINICAL IMPRESSION(S) / ED DIAGNOSES   Final diagnoses:  Lower abdominal pain  Rx / DC Orders   ED Discharge Orders     None        Note:  This document was prepared using Dragon voice recognition software and may include unintentional dictation errors.   Jackelyn Hoehn, PA-C 01/04/23 1841    Trinna Post, MD 01/05/23 [phone]

## 2023-01-04 NOTE — ED Notes (Signed)
..  The patient is A&OX4, ambulatory at d/c with independent steady gait, NAD. Pt verbalized understanding of d/c instructions and follow up care.

## 2023-01-04 NOTE — Discharge Instructions (Signed)
Your labs and CT scan were normal.  Please follow-up with your outpatient provider.  Please return for any new, worsening, or change in symptoms or other concerns.  It was a pleasure caring for you today.

## 2023-03-23 ENCOUNTER — Other Ambulatory Visit: Payer: Self-pay

## 2023-03-23 DIAGNOSIS — C8800 Waldenstrom macroglobulinemia not having achieved remission: Secondary | ICD-10-CM

## 2023-03-26 ENCOUNTER — Inpatient Hospital Stay (HOSPITAL_BASED_OUTPATIENT_CLINIC_OR_DEPARTMENT_OTHER): Payer: Medicare Other | Admitting: Oncology

## 2023-03-26 ENCOUNTER — Encounter: Payer: Self-pay | Admitting: Oncology

## 2023-03-26 ENCOUNTER — Inpatient Hospital Stay: Payer: Medicare Other | Attending: Oncology

## 2023-03-26 ENCOUNTER — Inpatient Hospital Stay: Payer: Medicare Other

## 2023-03-26 VITALS — BP 130/85 | HR 73 | Temp 97.3°F | Resp 18 | Wt 153.0 lb

## 2023-03-26 DIAGNOSIS — C8800 Waldenstrom macroglobulinemia not having achieved remission: Secondary | ICD-10-CM

## 2023-03-26 DIAGNOSIS — C8801 Waldenstrom macroglobulinemia, in remission: Secondary | ICD-10-CM | POA: Insufficient documentation

## 2023-03-26 DIAGNOSIS — M79604 Pain in right leg: Secondary | ICD-10-CM | POA: Diagnosis not present

## 2023-03-26 DIAGNOSIS — M79605 Pain in left leg: Secondary | ICD-10-CM | POA: Insufficient documentation

## 2023-03-26 DIAGNOSIS — Z87891 Personal history of nicotine dependence: Secondary | ICD-10-CM | POA: Diagnosis not present

## 2023-03-26 LAB — CBC (CANCER CENTER ONLY)
HCT: 41.1 % (ref 39.0–52.0)
Hemoglobin: 13.7 g/dL (ref 13.0–17.0)
MCH: 30 pg (ref 26.0–34.0)
MCHC: 33.3 g/dL (ref 30.0–36.0)
MCV: 89.9 fL (ref 80.0–100.0)
Platelet Count: 384 10*3/uL (ref 150–400)
RBC: 4.57 MIL/uL (ref 4.22–5.81)
RDW: 13.4 % (ref 11.5–15.5)
WBC Count: 5.8 10*3/uL (ref 4.0–10.5)
nRBC: 0 % (ref 0.0–0.2)

## 2023-03-26 LAB — CMP (CANCER CENTER ONLY)
ALT: 26 U/L (ref 0–44)
AST: 26 U/L (ref 15–41)
Albumin: 4.1 g/dL (ref 3.5–5.0)
Alkaline Phosphatase: 102 U/L (ref 38–126)
Anion gap: 8 (ref 5–15)
BUN: 22 mg/dL (ref 8–23)
CO2: 27 mmol/L (ref 22–32)
Calcium: 9.1 mg/dL (ref 8.9–10.3)
Chloride: 103 mmol/L (ref 98–111)
Creatinine: 0.87 mg/dL (ref 0.61–1.24)
GFR, Estimated: >60 mL/min (ref >60)
Glucose, Bld: 93 mg/dL (ref 70–99)
Potassium: 4.9 mmol/L (ref 3.5–5.1)
Sodium: 138 mmol/L (ref 135–145)
Total Bilirubin: 1.3 mg/dL — ABNORMAL HIGH (ref <1.2)
Total Protein: 7.1 g/dL (ref 6.5–8.1)

## 2023-03-27 NOTE — Progress Notes (Signed)
Hematology/Oncology Consult note [facility]  Telephone:([phone] [phone]  Patient Care Team: Jerl Mina, MD as PCP - General (Family Medicine) Vida Rigger, MD (Gastroenterology) Lonell Face, MD as Consulting Physician (Neurology) Zenon Mayo, MD as Referring Physician (Orthopedic Surgery) Theadore Nan, NP as Nurse Practitioner (Nurse Practitioner) Vanna Scotland, MD as Consulting Physician (Urology) Wyn Quaker Marlow Baars, MD as Referring Physician (Vascular Surgery) Creig Hines, MD as Consulting Physician (Oncology)   Name of the patient: [name]  [id number]  1950/08/24   Date of visit: 03/27/23  Diagnosis-history of Waldenstrm's macroglobulinemia  Chief complaint/ Reason for visit-routine follow-up of Waldenstrm's macroglobulinemia currently in remission  Heme/Onc history:  Patient is a 72 year old male with a history of Walden Strom's macroglobulinemia and was seeing Dr. Merlene Pulling previously and is transferring his care to me.  He was initially diagnosed with Emmit Alexanders macroglobulinemia in 2012 when he presented with headache and visual changes.  He received weekly Rituxan x4 followed by maintenance Rituxan for 2 years until March 2014.  He had recurrence of dizziness and vertigo in June 2015 and underwent plasmapheresis at [facility] with improvement of symptoms and he was retreated with Rituxan x4.  He completed this treatment in July 2015.  He was started on maintenance ibrutinib in November 2015 which he did not tolerate.  Maintenance Rituxan was started thereafter and stopped in November 2016.  His IgM levels have been between 3000-5000 when he is symptomatic and presently in the 400s.  Patient also has chronic abdominal pain and CT scans have been unrevealing.     Interval history-patient currently reports bilateral lower extremity pain that starts from his lower back and radiates down to his bilateral lower  extremities.  He has been seeing orthopedics for this and states that x-rays were unremarkable.  ECOG PS- 1 Pain scale- 0   Review of systems- Review of Systems  Constitutional:  Negative for chills, fever, malaise/fatigue and weight loss.  HENT:  Negative for congestion, ear discharge and nosebleeds.   Eyes:  Negative for blurred vision.  Respiratory:  Negative for cough, hemoptysis, sputum production, shortness of breath and wheezing.   Cardiovascular:  Negative for chest pain, palpitations, orthopnea and claudication.  Gastrointestinal:  Negative for abdominal pain, blood in stool, constipation, diarrhea, heartburn, melena, nausea and vomiting.  Genitourinary:  Negative for dysuria, flank pain, frequency, hematuria and urgency.  Musculoskeletal:  Positive for back pain and joint pain. Negative for myalgias.  Skin:  Negative for rash.  Neurological:  Negative for dizziness, tingling, focal weakness, seizures, weakness and headaches.  Endo/Heme/Allergies:  Does not bruise/bleed easily.  Psychiatric/Behavioral:  Negative for depression and suicidal ideas. The patient does not have insomnia.       Allergies  Allergen Reactions   Ibrutinib Nausea Only     Past Medical History:  Diagnosis Date   Anemia    prev bone marrow bx 02/11/10   Anxiety    Bone cancer (HCC)    Colitis 2004   Colon polyps    Depression    Dysphagia    GERD (gastroesophageal reflux disease)    Need for prophylactic vaccination and inoculation against influenza 01/31/2013   Neuropathy    Sleep apnea 09-12-10   Uses CPAP   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Past Surgical History:  Procedure Laterality Date   CHOLECYSTECTOMY     COLONOSCOPY  03/17/2011   Procedure: COLONOSCOPY;  Surgeon: Petra Kuba, MD;  Location: [facility]  ENDOSCOPY;  Service: Endoscopy;  Laterality: N/A;   COLONOSCOPY N/A 06/12/2022   Procedure: COLONOSCOPY;  Surgeon: Jaynie Collins, DO;  Location: [facility] ENDOSCOPY;  Service:  Gastroenterology;  Laterality: N/A;   COLONOSCOPY WITH PROPOFOL N/A 10/07/2016   Procedure: COLONOSCOPY WITH PROPOFOL;  Surgeon: Scot Jun, MD;  Location: [facility] ENDOSCOPY;  Service: Endoscopy;  Laterality: N/A;   ESOPHAGOGASTRODUODENOSCOPY N/A 06/12/2022   Procedure: ESOPHAGOGASTRODUODENOSCOPY (EGD);  Surgeon: Jaynie Collins, DO;  Location: [facility] ENDOSCOPY;  Service: Gastroenterology;  Laterality: N/A;  WITH DIL   ESOPHAGOGASTRODUODENOSCOPY (EGD) WITH PROPOFOL N/A 10/07/2016   Procedure: ESOPHAGOGASTRODUODENOSCOPY (EGD) WITH PROPOFOL;  Surgeon: Scot Jun, MD;  Location: [facility] ENDOSCOPY;  Service: Endoscopy;  Laterality: N/A;   ESOPHAGOGASTRODUODENOSCOPY (EGD) WITH PROPOFOL N/A 01/24/2018   Procedure: ESOPHAGOGASTRODUODENOSCOPY (EGD) WITH PROPOFOL with Gastric Mapping;  Surgeon: Wyline Mood, MD;  Location: [facility] ENDOSCOPY;  Service: Gastroenterology;  Laterality: N/A;   FRACTURE SURGERY     HIP FRACTURE SURGERY  04/2011   R hip   HIP FRACTURE SURGERY Right 02/2016   JOINT REPLACEMENT     NASAL FRACTURE SURGERY     ROBOTIC ASSISTED LAPAROSCOPIC CHOLECYSTECTOMY-MULTI SITE N/A 03/17/2018   Procedure: ROBOTIC ASSISTED LAPAROSCOPIC CHOLECYSTECTOMY-MULTI SITE;  Surgeon: Leafy Ro, MD;  Location: [facility] ORS;  Service: General;  Laterality: N/A;    Social History   Socioeconomic History   Marital status: Married    Spouse name: Not on file   Number of children: Not on file   Years of education: Not on file   Highest education level: Not on file  Occupational History   Not on file  Tobacco Use   Smoking status: Former    Current packs/day: 0.00    Types: Cigarettes    Quit date: 04/14/2007    Years since quitting: 15.9   Smokeless tobacco: Never  Vaping Use   Vaping status: Never Used  Substance and Sexual Activity   Alcohol use: No   Drug use: No   Sexual activity: Not Currently  Other Topics Concern   Not on file  Social History Narrative   Not on file    Social Drivers of Health   Financial Resource Strain: Low Risk  (02/15/2023)   Received from [facility]   Overall Financial Resource Strain (CARDIA)    Difficulty of Paying Living Expenses: Not hard at all  Food Insecurity: No Food Insecurity (02/15/2023)   Received from [facility]   Hunger Vital Sign    Worried About Running Out of Food in the Last Year: Never true    Ran Out of Food in the Last Year: Never true  Transportation Needs: No Transportation Needs (02/15/2023)   Received from [facility] - Transportation    In the past 12 months, has lack of transportation kept you from medical appointments or from getting medications?: No    Lack of Transportation (Non-Medical): No  Physical Activity: Not on file  Stress: Not on file  Social Connections: Not on file  Intimate Partner Violence: Not on file    Family History  Problem Relation Age of Onset   Prostate cancer Neg Hx    Bladder Cancer Neg Hx    Kidney cancer Neg Hx      Current Outpatient Medications:    acetaminophen (TYLENOL) 500 MG tablet, Take 500 mg by mouth every 6 (six) hours as needed., Disp: , Rfl:    pantoprazole (PROTONIX) 40 MG tablet, Take 40  mg by mouth daily. , Disp: , Rfl:   Physical exam:  Vitals:   03/26/23 1115  BP: 130/85  Pulse: 73  Resp: 18  Temp: (!) 97.3 F (36.3 C)  TempSrc: Tympanic  SpO2: 100%  Weight: 153 lb (69.4 kg)   Physical Exam Cardiovascular:     Rate and Rhythm: Normal rate and regular rhythm.     Heart sounds: Normal heart sounds.  Pulmonary:     Effort: Pulmonary effort is normal.     Breath sounds: Normal breath sounds.  Abdominal:     General: Bowel sounds are normal.     Palpations: Abdomen is soft.     Comments: No palpable hepatosplenomegaly  Lymphadenopathy:     Comments: No palpable cervical, supraclavicular, axillary or inguinal adenopathy    Skin:    General: Skin is warm and dry.   Neurological:     Mental Status: He is alert and oriented to person, place, and time.         Latest Ref Rng & Units 03/26/2023   10:51 AM  CMP  Glucose 70 - 99 mg/dL 93   BUN 8 - 23 mg/dL 22   Creatinine 5.36 - 1.24 mg/dL 6.44   Sodium 034 - 742 mmol/L 138   Potassium 3.5 - 5.1 mmol/L 4.9   Chloride 98 - 111 mmol/L 103   CO2 22 - 32 mmol/L 27   Calcium 8.9 - 10.3 mg/dL 9.1   Total Protein 6.5 - 8.1 g/dL 7.1   Total Bilirubin <5.9 mg/dL 1.3   Alkaline Phos 38 - 126 U/L 102   AST 15 - 41 U/L 26   ALT 0 - 44 U/L 26       Latest Ref Rng & Units 03/26/2023   10:51 AM  CBC  WBC 4.0 - 10.5 K/uL 5.8   Hemoglobin 13.0 - 17.0 g/dL 56.3   Hematocrit 87.5 - 52.0 % 41.1   Platelets 150 - 400 K/uL 384     Assessment and plan- Patient is a 72 y.o. male with history of Waldenstrm's macroglobulinemia currently in remission hereFor a routine follow-up  Clinically patient does not have any B symptoms.  His appetite and weight have remained stable.  CBC and CMP are overall unremarkable.  Myeloma panel and serum free light chains is currently pending.  Patient also had a CT abdomen pelvis with contrast in September 2024 which did not show any evidence of splenomegaly or intra-abdominal adenopathy.  No osseous abnormality was also noted.  His symptoms of bilateral leg pain unrelated to Waldenstrm's.  I have recommended that he should speak to orthopedics for need for any further imaging including MRI.  Referral to neurology is also consideration in the future.  I will see him back in 1 year with labs CBC with differential CMP myeloma panel and serum free light chains   Visit Diagnosis 1. 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Dr. Owens Shark, MD, MPH [facility] [phone] 03/27/2023 5:56 PM

## 2023-03-29 ENCOUNTER — Emergency Department: Payer: Medicare Other

## 2023-03-29 ENCOUNTER — Other Ambulatory Visit: Payer: Self-pay

## 2023-03-29 ENCOUNTER — Emergency Department
Admission: EM | Admit: 2023-03-29 | Discharge: 2023-03-30 | Disposition: A | Discharge status: Home / Self Care | Payer: Medicare Other | Attending: Emergency Medicine | Admitting: Emergency Medicine

## 2023-03-29 ENCOUNTER — Encounter: Payer: Self-pay | Admitting: Emergency Medicine

## 2023-03-29 DIAGNOSIS — I10 Essential (primary) hypertension: Secondary | ICD-10-CM | POA: Insufficient documentation

## 2023-03-29 DIAGNOSIS — R4182 Altered mental status, unspecified: Secondary | ICD-10-CM | POA: Diagnosis not present

## 2023-03-29 DIAGNOSIS — C8800 Waldenstrom macroglobulinemia not having achieved remission: Secondary | ICD-10-CM | POA: Insufficient documentation

## 2023-03-29 DIAGNOSIS — R42 Dizziness and giddiness: Secondary | ICD-10-CM | POA: Diagnosis not present

## 2023-03-29 DIAGNOSIS — J449 Chronic obstructive pulmonary disease, unspecified: Secondary | ICD-10-CM | POA: Diagnosis not present

## 2023-03-29 DIAGNOSIS — G8929 Other chronic pain: Secondary | ICD-10-CM | POA: Insufficient documentation

## 2023-03-29 DIAGNOSIS — R451 Restlessness and agitation: Secondary | ICD-10-CM | POA: Diagnosis not present

## 2023-03-29 DIAGNOSIS — K219 Gastro-esophageal reflux disease without esophagitis: Secondary | ICD-10-CM | POA: Insufficient documentation

## 2023-03-29 DIAGNOSIS — R2681 Unsteadiness on feet: Secondary | ICD-10-CM | POA: Diagnosis not present

## 2023-03-29 DIAGNOSIS — M545 Low back pain, unspecified: Secondary | ICD-10-CM | POA: Insufficient documentation

## 2023-03-29 DIAGNOSIS — Z85830 Personal history of malignant neoplasm of bone: Secondary | ICD-10-CM | POA: Insufficient documentation

## 2023-03-29 DIAGNOSIS — R202 Paresthesia of skin: Secondary | ICD-10-CM | POA: Insufficient documentation

## 2023-03-29 DIAGNOSIS — R200 Anesthesia of skin: Secondary | ICD-10-CM | POA: Diagnosis present

## 2023-03-29 LAB — DIFFERENTIAL
Abs Immature Granulocytes: 0.02 10*3/uL (ref 0.00–0.07)
Basophils Absolute: 0 10*3/uL (ref 0.0–0.1)
Basophils Relative: 0 %
Eosinophils Absolute: 0 10*3/uL (ref 0.0–0.5)
Eosinophils Relative: 0 %
Immature Granulocytes: 0 %
Lymphocytes Relative: 23 %
Lymphs Abs: 1.6 10*3/uL (ref 0.7–4.0)
Monocytes Absolute: 0.5 10*3/uL (ref 0.1–1.0)
Monocytes Relative: 7 %
Neutro Abs: 4.8 10*3/uL (ref 1.7–7.7)
Neutrophils Relative %: 70 %

## 2023-03-29 LAB — COMPREHENSIVE METABOLIC PANEL
ALT: 26 U/L (ref 0–44)
AST: 27 U/L (ref 15–41)
Albumin: 4.2 g/dL (ref 3.5–5.0)
Alkaline Phosphatase: 102 U/L (ref 38–126)
Anion gap: 9 (ref 5–15)
BUN: 26 mg/dL — ABNORMAL HIGH (ref 8–23)
CO2: 23 mmol/L (ref 22–32)
Calcium: 9.3 mg/dL (ref 8.9–10.3)
Chloride: 107 mmol/L (ref 98–111)
Creatinine, Ser: 0.83 mg/dL (ref 0.61–1.24)
GFR, Estimated: >60 mL/min (ref >60)
Glucose, Bld: 106 mg/dL — ABNORMAL HIGH (ref 70–99)
Potassium: 4.3 mmol/L (ref 3.5–5.1)
Sodium: 139 mmol/L (ref 135–145)
Total Bilirubin: 1.1 mg/dL (ref <1.2)
Total Protein: 7.6 g/dL (ref 6.5–8.1)

## 2023-03-29 LAB — CBC
HCT: 40.7 % (ref 39.0–52.0)
Hemoglobin: 13.9 g/dL (ref 13.0–17.0)
MCH: 30.5 pg (ref 26.0–34.0)
MCHC: 34.2 g/dL (ref 30.0–36.0)
MCV: 89.3 fL (ref 80.0–100.0)
Platelets: 421 10*3/uL — ABNORMAL HIGH (ref 150–400)
RBC: 4.56 MIL/uL (ref 4.22–5.81)
RDW: 13.3 % (ref 11.5–15.5)
WBC: 6.9 10*3/uL (ref 4.0–10.5)
nRBC: 0 % (ref 0.0–0.2)

## 2023-03-29 LAB — URINALYSIS, W/ REFLEX TO CULTURE (INFECTION SUSPECTED)
Bacteria, UA: NONE SEEN
Bilirubin Urine: NEGATIVE
Glucose, UA: NEGATIVE mg/dL
Hgb urine dipstick: NEGATIVE
Ketones, ur: NEGATIVE mg/dL
Leukocytes,Ua: NEGATIVE
Nitrite: NEGATIVE
Protein, ur: NEGATIVE mg/dL
RBC / HPF: 0 RBC/hpf (ref 0–5)
Specific Gravity, Urine: 1.008 (ref 1.005–1.030)
pH: 8 (ref 5.0–8.0)

## 2023-03-29 LAB — CBG MONITORING, ED: Glucose-Capillary: 100 mg/dL — ABNORMAL HIGH (ref 70–99)

## 2023-03-29 LAB — PROTIME-INR
INR: 1 (ref 0.8–1.2)
Prothrombin Time: 13.4 s (ref 11.4–15.2)

## 2023-03-29 LAB — KAPPA/LAMBDA LIGHT CHAINS
Kappa free light chain: 26.3 mg/L — ABNORMAL HIGH (ref 3.3–19.4)
Kappa, lambda light chain ratio: 1.12 (ref 0.26–1.65)
Lambda free light chains: 23.5 mg/L (ref 5.7–26.3)

## 2023-03-29 LAB — APTT: aPTT: 26 s (ref 24–36)

## 2023-03-29 LAB — ETHANOL: Alcohol, Ethyl (B): <10 mg/dL (ref <10)

## 2023-03-29 MED ORDER — ACETAMINOPHEN 500 MG PO TABS
1000.0000 mg | ORAL_TABLET | Freq: Once | ORAL | Status: AC
  Administered 2023-03-29: 1000 mg via ORAL
  Filled 2023-03-29: qty 2

## 2023-03-29 MED ORDER — SODIUM CHLORIDE 0.9 % IV BOLUS
1000.0000 mL | Freq: Once | INTRAVENOUS | Status: AC
  Administered 2023-03-29: 1000 mL via INTRAVENOUS

## 2023-03-29 MED ORDER — IBUPROFEN 600 MG PO TABS: 600.0000 mg | ORAL_TABLET | Freq: Three times a day (TID) | ORAL | Status: DC | PRN

## 2023-03-29 MED ORDER — ONDANSETRON HCL 4 MG/2ML IJ SOLN
4.0000 mg | Freq: Once | INTRAMUSCULAR | Status: AC
  Administered 2023-03-29: 4 mg via INTRAVENOUS
  Filled 2023-03-29: qty 2

## 2023-03-29 MED ORDER — FENTANYL CITRATE PF 50 MCG/ML IJ SOSY
50.0000 ug | PREFILLED_SYRINGE | Freq: Once | INTRAMUSCULAR | Status: AC
  Administered 2023-03-29: 50 ug via INTRAVENOUS
  Filled 2023-03-29: qty 1

## 2023-03-29 MED ORDER — GADOBUTROL 1 MMOL/ML IV SOLN
7.0000 mL | Freq: Once | INTRAVENOUS | Status: AC | PRN
  Administered 2023-03-29: 7 mL via INTRAVENOUS

## 2023-03-29 MED ORDER — TRAMADOL HCL 50 MG PO TABS
50.0000 mg | ORAL_TABLET | Freq: Once | ORAL | Status: AC
  Administered 2023-03-29: 50 mg via ORAL
  Filled 2023-03-29: qty 1

## 2023-03-29 MED ORDER — SODIUM CHLORIDE 0.9% FLUSH
3.0000 mL | Freq: Once | INTRAVENOUS | Status: AC
  Administered 2023-03-29: 3 mL via INTRAVENOUS

## 2023-03-29 MED ORDER — MORPHINE SULFATE (PF) 4 MG/ML IV SOLN: 4.0000 mg | Freq: Once | INTRAVENOUS | Status: DC

## 2023-03-29 MED ORDER — ONDANSETRON HCL 4 MG PO TABS
4.0000 mg | ORAL_TABLET | Freq: Three times a day (TID) | ORAL | Status: DC | PRN
  Administered 2023-03-30: 4 mg via ORAL
  Filled 2023-03-29: qty 1

## 2023-03-29 NOTE — ED Notes (Signed)
Pt gone to MRI accompanied by float RN

## 2023-03-29 NOTE — ED Provider Triage Note (Signed)
Emergency Medicine Provider Triage Evaluation Note  [name] , a 72 y.o. male  was evaluated in triage.  Pt complains of cannot feel his legs.  History of bone cancer..  Review of Systems  Positive:  Negative:   Physical Exam  There were no vitals taken for this visit. Gen:   Awake, no distress   Resp:  Normal effort  MSK:   Cannot feel my touch left leg, however can move both legs Other:    Medical Decision Making  Medically screening exam initiated at 11:32 AM.  Appropriate orders placed.  [name] was informed that the remainder of the evaluation will be completed by another provider, this initial triage assessment does not replace that evaluation, and the importance of remaining in the ED until their evaluation is complete.     Harold Ghee, PA-C 03/29/23 1133

## 2023-03-29 NOTE — ED Notes (Signed)
Pt given coffee with MD's approval

## 2023-03-29 NOTE — Consult Note (Signed)
[facility] Tele[facility]urology Consult Services  Stat Consult  Patient Name:   [name], [name] Date of Birth:   Oct 24, 1950 Identification Number:   MRN - [id number] Date of Service:   03/29/2023 21:01:44  Diagnosis:       R26.81 - Unsteady gait  Impression 72 year old male history of Waldenstrm's macroglobulinemia, chronic hip pain, who I am seeing for right leg pain, bilateral foot numbness.  Patient being seen for bilateral numbness tingling in his feet for the last week, unsteady gait and right leg pain. Neuroexam showing patient able to move all 4 extremities but when he lifts up the right leg he has pain radiating from the thigh down to the knee. MRI brain negative for infarct. MRI lumbar spine showing inflammatory Schmorl's node at L4 which would match with his dermatomal pain in R leg.  At this time would suggest pain control with tramadol, physical therapy consult. He is also been having headaches and dizziness. He can be given migraine abortives to help with the headache. If symptoms not improving or worsening, may need to consider obtaining lumbar puncture to rule out CNS inflammation from [facility] which is rare and recent oncology visit showed normal blood counts. Marylynn Pearson barre also on differential but seems less likely.         Recommendations: Our recommendations are outlined below.  Diagnostic Studies : Pain control tramadol  PT consult  Neuro follow up in AM  Consultations : Physical therapy/Occupational therapy    ----------------------------------------------------------------------------------------------------    Metrics: Dispatch Time: 03/29/2023 21:01:44 Callback Response Time: 03/29/2023 21:02:40  Primary Provider Notified of Diagnostic Impression and Management Plan on: 03/29/2023 22:25:26     ----------------------------------------------------------------------------------------------------  Chief Complaint: R leg  pain  History of Present Illness: Patient is a 72 year old Male. 72 year old male history of Waldenstrm's macroglobulinemia, chronic hip pain, who I am seeing for right leg pain, bilateral foot numbness.  Patient states that for the last week or so he has been having tingling numbness sensation in his foot bilaterally. He has had this for a long time but seems to have worsened over the last week. He is also been having headache behind his eyes, dizziness and pain going down his right thigh. He finds it difficult to walk when he stands up because he gets dizzy. He did see his oncologist who checked his blood levels and found they were normal. He did not feel that this was related to [facility]. He had MRI brain today which showed no acute process. MRI cervical spine with moderate to severe left and right neuroforaminal narrowing at C6 and C7. MRI thoracic spine without acute process, MRI lumbar spine showing L4 body level inflammatory Schmorl's node which could be source of his pain. No evidence of pathologic compression deformity. No evidence of high-grade stenosis.  At this time   Past Medical History:      Hypertension Other PMH:  waldenstroms  Medications:  No Anticoagulant use  No Antiplatelet use Reviewed EMR for current medications  Allergies:  Reviewed  Social History: Drug Use: No  Family History:  There is no family history of premature cerebrovascular disease pertinent to this consultation  ROS : 14 Points Review of Systems was performed and was negative except mentioned in HPI.  Past Surgical History: There Is No Surgical History Contributory To Today's Visit   Examination: BP(141/92), Pulse(73), 1A: Level of Consciousness - Alert; keenly responsive + 0 1B: Ask Month and Age - Both Questions Right + 0 1C: Blink Eyes &  Squeeze Hands - Performs Both Tasks + 0 2: Test Horizontal Extraocular Movements - Normal + 0 3: Test Visual Fields - No Visual Loss + 0 4:  Test Facial Palsy (Use Grimace if Obtunded) - Normal symmetry + 0 5A: Test Left Arm Motor Drift - No Drift for 10 Seconds + 0 5B: Test Right Arm Motor Drift - No Drift for 10 Seconds + 0 6A: Test Left Leg Motor Drift - No Drift for 5 Seconds + 0 6B: Test Right Leg Motor Drift - No Drift for 5 Seconds + 0 7: Test Limb Ataxia (FNF/Heel-Shin) - No Ataxia + 0 8: Test Sensation - Normal; No sensory loss + 0 9: Test Language/Aphasia - Normal; No aphasia + 0 10: Test Dysarthria - Normal + 0 11: Test Extinction/Inattention - No abnormality + 0  NIHSS Score: 0  Spoke with : Dr Vicente Males    This consult was conducted in real time using interactive audio and Immunologist. Patient was informed of the technology being used for this visit and agreed to proceed. Patient located in hospital and provider located at home/office setting.  Patient is being evaluated for possible acute neurologic impairment and high probability of imminent or life - threatening deterioration.I spent total of 35 minutes providing care to this patient, including time for face to face visit via telemedicine, review of medical records, imaging studies and discussion of findings with providers, the patient and / or family.   Dr Scherrie Gerlach   [facility] For Inpatient follow-up with [facility] physician please call [facility] at [phone]. As we are not an outpatient service for any post hospital discharge needs please contact the hospital for assistance.  If you have any questions for the [facility] physicians or need to reconsult for clinical or diagnostic changes please contact us via [facility] at [phone].

## 2023-03-29 NOTE — ED Notes (Signed)
Pt gone to CT 

## 2023-03-29 NOTE — ED Provider Notes (Signed)
[facility] Provider Note    Event Date/Time   First MD Initiated Contact with Patient 03/29/23 1147     (approximate)   History   Chief Complaint: Numbness   HPI  [name] is a 72 y.o. male with a history of GERD, depression, Waldenstrm's macroglobulinemia who is brought to the ED due to bilateral leg paresthesia that was first noticed on waking up this morning.  Family also note that he is more agitated and seems to be confused which is not normal for him.  No recent trauma, no fevers or chills vomiting or diarrhea.  Denies any recent illness.          Physical Exam   Triage Vital Signs: ED Triage Vitals  Encounter Vitals Group     BP 03/29/23 1134 (!) 142/87     Systolic BP Percentile --      Diastolic BP Percentile --      Pulse Rate 03/29/23 1134 75     Resp 03/29/23 1134 18     Temp --      Temp src --      SpO2 03/29/23 1134 98 %     Weight 03/29/23 1133 152 lb 1.9 oz (69 kg)     Height --      Head Circumference --      Peak Flow --      Pain Score --      Pain Loc --      Pain Education --      Exclude from Growth Chart --     Most recent vital signs: Vitals:   03/29/23 1320 03/29/23 1440  BP: 128/81 (!) 143/84  Pulse: 70 63  Resp: (!) 23 (!) 24  SpO2: 100% 100%    General: Awake, no distress.  CV:  Good peripheral perfusion.  Regular rate rhythm.  Normal DP pulses bilaterally Resp:  Normal effort.  Clear auscultation bilaterally Abd:  No distention.  Soft with generalized tenderness, nonfocal Other:  Dry oral mucosa.  Cranial nerves III through XII intact.  No drift, normal lower extremity reflexes.  Patient endorses generalized paresthesia of bilateral legs.   ED Results / Procedures / Treatments   Labs (all labs ordered are listed, but only abnormal results are displayed) Labs Reviewed  CBC - Abnormal; Notable for the following components:      Result Value   Platelets 421 (*)    All other components  within normal limits  COMPREHENSIVE METABOLIC PANEL - Abnormal; Notable for the following components:   Glucose, Bld 106 (*)    BUN 26 (*)    All other components within normal limits  URINALYSIS, W/ REFLEX TO CULTURE (INFECTION SUSPECTED) - Abnormal; Notable for the following components:   Color, Urine YELLOW (*)    APPearance CLEAR (*)    All other components within normal limits  CBG MONITORING, ED - Abnormal; Notable for the following components:   Glucose-Capillary 100 (*)    All other components within normal limits  PROTIME-INR  APTT  DIFFERENTIAL  ETHANOL  I-STAT CREATININE, ED  CBG MONITORING, ED     EKG Interpreted by me Sinus rhythm rate of 63.  Normal axis intervals QRS ST segments and T waves   RADIOLOGY CT head interpreted by me, unremarkable.  Radiology report reviewed  CT cervical thoracic and lumbar spine significant for L4 compression fracture with 20% loss of height.   PROCEDURES:  Procedures   MEDICATIONS ORDERED IN ED:  Medications  traMADol (ULTRAM) tablet 50 mg (has no administration in time range)  acetaminophen (TYLENOL) tablet 1,000 mg (has no administration in time range)  ondansetron (ZOFRAN) injection 4 mg (4 mg Intravenous Given 03/29/23 1203)  fentaNYL (SUBLIMAZE) injection 50 mcg (50 mcg Intravenous Given 03/29/23 1204)  sodium chloride flush (NS) 0.9 % injection 3 mL (3 mLs Intravenous Given 03/29/23 1209)  sodium chloride 0.9 % bolus 1,000 mL (1,000 mLs Intravenous New Bag/Given 03/29/23 1430)  gadobutrol (GADAVIST) 1 MMOL/ML injection 7 mL (7 mLs Intravenous Contrast Given 03/29/23 1418)     IMPRESSION / MDM / ASSESSMENT AND PLAN / ED COURSE  I reviewed the triage vital signs and the nursing notes.  DDx: Spinal compression fracture, cauda equina syndrome, ischemic stroke, electrolyte abnormality, anemia,  Patient's presentation is most consistent with acute presentation with potential threat to life or bodily function.  Patient  presents with paresthesia of bilateral legs and not being able to get up out of bed today.  Initial labs unremarkable.  CT imaging shows L4 compression fracture with 20% loss of height.  Will obtain MRI of the L-spine as suggested by radiology, will also obtain MRI of the brain.   ----------------------------------------- 3:13 PM on 03/29/2023 ----------------------------------------- MRI lumbar spine negative for fracture or metastasis.  Previous CT finding is determined to be a inflammatory Schmorl's node.  Awaiting MRI brain and reassessment of symptoms and ambulation      FINAL CLINICAL IMPRESSION(S) / ED DIAGNOSES   Final diagnoses:  Lumbar pain  Paresthesias     Rx / DC Orders   ED Discharge Orders     None        Note:  This document was prepared using Dragon voice recognition software and may include unintentional dictation errors.   Sharman Cheek, MD 03/29/23 [phone]

## 2023-03-29 NOTE — ED Triage Notes (Signed)
Patient to ED via POV for bilateral leg numbness. PT able to move both legs. Aox4 Hx of bone cancer. Pt also c/o dizziness and nausea. Not getting any current cancer tx.

## 2023-03-30 DIAGNOSIS — R202 Paresthesia of skin: Secondary | ICD-10-CM | POA: Diagnosis not present

## 2023-03-30 NOTE — ED Notes (Signed)
Ambulated to the restroom with minimal assist

## 2023-03-30 NOTE — TOC Initial Note (Signed)
Transition of Care [facility]) - Initial/Assessment Note    Patient Details  Name: [name] MRN: [id number] Date of Birth: [birth year]  [facility]) CM/SW Contact:    Marquita Palms, LCSW Phone Number: 03/30/2023, 11:23 AM  Clinical Narrative:                  CSW met with patient bedside. [facility] reports [facility] [place] [facility]. Patient reports he has PCP and had a visit last week. Patient leaves with wife at home. Patient reports he has no DME needs at this time. Patient reports his daughter will be picking him up upon discharge. Patient reports he has had an issue with his stomach but as been on chemotherapy treatments. Patient has no other needs at this time.  Expected Discharge Plan: Home w Home Health Services (Patient agreeable to [facility] is offered by MD.) Barriers to Discharge: Continued Medical Work up   Patient Goals and CMS Choice Patient states their goals for this hospitalization and ongoing recovery are:: to be able to able to continue living.          Expected Discharge Plan and Services In-house Referral: NA   Post Acute Care Choice: Home Health (if neccessay) Living arrangements for the past 2 months: Single Family Home                           HH Arranged: NA          Prior Living Arrangements/Services Living arrangements for the past 2 months: Single Family Home Lives with:: Spouse Patient language and need for interpreter reviewed:: No Do you feel safe going back to the place where you live?: Yes      Need for Family Participation in Patient Care: Yes (Comment) Care giver support system in place?: Yes (comment) Current home services: Other (comment) Criminal Activity/Legal Involvement Pertinent to Current Situation/Hospitalization: No - Comment as needed  Activities of Daily Living      Permission Sought/Granted                  Emotional Assessment Appearance::  Disheveled Attitude/Demeanor/Rapport: Gracious Affect (typically observed): Hopeful Orientation: : Oriented to Self, Oriented to Place, Oriented to  Time, Oriented to Situation      Admission diagnosis:  dizziness Patient Active Problem List   Diagnosis Date Noted   OSA on CPAP 12/22/2021   CPAP use counseling 12/22/2021   History of right hip replacement 09/23/2021   Anxiety 09/23/2021   Encounter for immunization 09/23/2021   Other specified cataract 09/23/2021   Preglaucoma, unspecified, bilateral 09/23/2021   Unilateral primary osteoarthritis, right knee 09/23/2021   Gastro-esophageal reflux disease without esophagitis 09/23/2021   Gastroesophageal reflux disease 09/23/2021   Pain in right knee 09/23/2021   History of total hip arthroplasty 04/08/2018   Biliary dyskinesia    Right hip pain 07/10/2017   Diverticulosis of sigmoid colon 02/12/2017   Influenza vaccine administered 01/25/2017   AAA (abdominal aortic aneurysm) without rupture (HCC) 01/13/2017   Prostatitis 01/12/2017   Aortic atherosclerosis (HCC) 01/12/2017   Urinary dysfunction 12/11/2016   Testicular asymmetry 12/11/2016   Enteritis 12/11/2016   Diarrhea, functional 12/11/2016   Abnormal CT scan, gastrointestinal tract 12/11/2016   Abdominal pain, diffuse 12/11/2016   Erosive gastritis 12/03/2016   Bilateral lower abdominal pain 12/03/2016   History of colonic polyps 07/27/2016   Abnormal CAT scan 09/17/2013   Abnormal CT scan, chest  09/17/2013   Waldenstrom macroglobulinemia 09/16/2013   Need for prophylactic vaccination and inoculation against influenza 01/31/2013   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GERD (gastroesophageal reflux disease)    PCP:  Jerl Mina, MD [phone] [address], [place] - [address] [place] [address] Phone: [phone] Fax: [phone]     Social Drivers of Health (SDOH) Social History: SDOH Screenings   Food  Insecurity: No Food Insecurity (02/15/2023)   Received from [facility]  Housing: Unknown (02/15/2023)   Received [facility]  Transportation Needs: No Transportation Needs (02/15/2023)   Received from [facility]  Utilities: Not At Risk (02/15/2023)   Received from [facility]  Financial Resource Strain: Low Risk  (02/15/2023)   Received from [facility]  Tobacco Use: Medium Risk (03/29/2023)   SDOH Interventions:     Readmission Risk Interventions     No data to display

## 2023-03-30 NOTE — Evaluation (Signed)
Physical Therapy Evaluation Patient Details Name: [name] MRN: [id number] DOB: June 24, 1950 Today's Date: 03/30/2023  History of Present Illness  Pt is 72 year old male presented for right leg pain, bilateral foot numbness, unsteady gait, headaches and dizziness. PMHx: GERD, depression, Waldenstrm's macroglobulinemia, chronic hip pain. 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   Clinical Impression  Pt alert, seated in bed with family at bedside oriented x4. Reported some improvement in symptoms compared to arrival to ED. He was able to perform bed mobility, transfers and ambulate modI-I, no LOB but did exhibit antalgic gait on RLE. Pt TTP on greater trochanter, bilateral SI joints, R piriformis. Reported pain/tightness with FABER positioning and some hip flexor pain as well. RLE strength functional, more limited by pain  than true weakness, and sensation grossly intact (decreased sensation of bilateral big toes/feet, symmetrical and decreased sensation in dermatomal pattern L3 on RLE). Questionable trochanteric bursitis. PT/pt also discussed orthopedic MD recommendation of injection. Pt did report a leg length discrepancy but has had workup and did not need his shoe built up.  Overall the patient demonstrated deficits (see "PT Problem List") that impede the patient's functional abilities and elevated pain with ADLs/IADLs and would benefit from further PT services to address this.      If plan is discharge home, recommend the following: Other (comment) (NA)   Can travel by private vehicle        Equipment Recommendations None recommended by PT  Recommendations for Other Services       Functional Status Assessment Patient has had a recent decline in their functional status and demonstrates the ability to make significant improvements in function in a reasonable and predictable amount of time.     Precautions / Restrictions Precautions Precautions:  Fall Restrictions Weight Bearing Restrictions Per Provider Order: No      Mobility  Bed Mobility Overal bed mobility: Modified Independent                  Transfers Overall transfer level: Modified independent                      Ambulation/Gait Ambulation/Gait assistance: Modified independent (Device/Increase time) Gait Distance (Feet): 100 Feet           General Gait Details: decreased speed, antalgic gait noted  Stairs            Wheelchair Mobility     Tilt Bed    Modified Rankin (Stroke Patients Only)       Balance Overall balance assessment: Mild deficits observed, not formally tested                                           Pertinent Vitals/Pain Pain Assessment Pain Assessment: 0-10 Pain Score: 5  Pain Location: R hip Pain Descriptors / Indicators: Aching, Sore, Sharp Pain Intervention(s): Limited activity within patient's tolerance, Monitored during session, Repositioned    Home Living Family/patient expects to be discharged to:: Private residence Living Arrangements: Spouse/significant other Available Help at Discharge: Family Type of Home: House Home Access: Stairs to enter Entrance Stairs-Rails: Left Entrance Stairs-Number of Steps: 2-3   Home Layout: One level Home Equipment: None Additional Comments: wife has extensive DME    Prior Function Prior Level of Function : Independent/Modified Independent;Driving  Mobility Comments: pt cares for his wife, has family nearby to assist as needed       Extremity/Trunk Assessment   Upper Extremity Assessment Upper Extremity Assessment: Overall WFL for tasks assessed    Lower Extremity Assessment Lower Extremity Assessment: Overall WFL for tasks assessed (decreased sensation (symmetrical) of BLE big toe. did report decreased sensation of RLE L3. strength functional but challenged by pain on RLE)    Cervical / Trunk  Assessment Cervical / Trunk Assessment: Normal  Communication      Cognition Arousal: Alert Behavior During Therapy: WFL for tasks assessed/performed Overall Cognitive Status: Within Functional Limits for tasks assessed                                          General Comments      Exercises     Assessment/Plan    PT Assessment Patient needs continued PT services  PT Problem List Decreased strength;Decreased range of motion;Decreased activity tolerance;Decreased balance;Pain       PT Treatment Interventions DME instruction;Therapeutic exercise;Gait training;Balance training;Stair training;Neuromuscular re-education;Functional mobility training;Therapeutic activities;Patient/family education;Manual techniques;Modalities    PT Goals (Current goals can be found in the Care Plan section)  Acute Rehab PT Goals Patient Stated Goal: to go home PT Goal Formulation: With patient Time For Goal Achievement: 04/13/23 Potential to Achieve Goals: Good Additional Goals Additional Goal #1: Pt will have NPRS 3 or less during ADLs/IADL.    Frequency Min 1X/week     Co-evaluation               AM-PAC PT "6 Clicks" Mobility  Outcome Measure Help needed turning from your back to your side while in a flat bed without using bedrails?: None Help needed moving from lying on your back to sitting on the side of a flat bed without using bedrails?: None Help needed moving to and from a bed to a chair (including a wheelchair)?: None Help needed standing up from a chair using your arms (e.g., wheelchair or bedside chair)?: None Help needed to walk in hospital room?: None Help needed climbing 3-5 steps with a railing? : None 6 Click Score: 24    End of Session   Activity Tolerance: Patient tolerated treatment well Patient left: in bed;with family/visitor present Nurse Communication: Mobility status PT Visit Diagnosis: Other abnormalities of gait and mobility  (R26.89);Pain Pain - Right/Left: Right Pain - part of body: Hip    Time: 6578-4696 PT Time Calculation (min) (ACUTE ONLY): 20 min   Charges:   PT Evaluation $PT Eval Low Complexity: 1 Low PT Treatments $Therapeutic Activity: 8-22 mins PT General Charges $$ ACUTE PT VISIT: 1 Visit        Olga Coaster PT, DPT 12:51 PM,03/30/23

## 2023-03-30 NOTE — ED Notes (Signed)
Pt is ambulating to and from the bathroom independently

## 2023-03-30 NOTE — Progress Notes (Signed)
OT Cancellation Note  Patient Details Name: [name] MRN: [id number] DOB: 1951/01/25   Cancelled Treatment:    Reason Eval/Treat Not Completed: OT screened, no needs identified, will sign off. Per PT, pt is at baseline excluding hip pain. No OT needs, will sign off in house.  Constance Goltz 03/30/2023, 12:46 PM

## 2023-03-30 NOTE — ED Notes (Signed)
Patient's daughter Rosita Kea called and asked for an update.  Updated on Patient status.  Expresses that she had no knowledge of planned SNF placement.  Will notify ED MD.  Best contact number for Morrie Sheldon is [phone]

## 2023-03-30 NOTE — ED Provider Notes (Signed)
Patient was seen by PT. They did not feel patient required SNF at this time. Discussed with patient and family at bedside. They are comfortable with discharge home.   Phineas Semen, MD 03/30/23 [phone]

## 2023-04-02 LAB — MULTIPLE MYELOMA PANEL, SERUM
Albumin SerPl Elph-Mcnc: 4 g/dL (ref 2.9–4.4)
Albumin/Glob SerPl: 1.3 (ref 0.7–1.7)
Alpha 1: 0.2 g/dL (ref 0.0–0.4)
Alpha2 Glob SerPl Elph-Mcnc: 0.8 g/dL (ref 0.4–1.0)
B-Globulin SerPl Elph-Mcnc: 1.1 g/dL (ref 0.7–1.3)
Gamma Glob SerPl Elph-Mcnc: 0.9 g/dL (ref 0.4–1.8)
Globulin, Total: 3.1 g/dL (ref 2.2–3.9)
IgA: 306 mg/dL (ref 61–437)
IgG (Immunoglobin G), Serum: 786 mg/dL (ref 603–1613)
IgM (Immunoglobulin M), Srm: 385 mg/dL — ABNORMAL HIGH (ref 15–143)
M Protein SerPl Elph-Mcnc: 0.3 g/dL — ABNORMAL HIGH
Total Protein ELP: 7.1 g/dL (ref 6.0–8.5)

## 2023-04-23 ENCOUNTER — Telehealth: Payer: Self-pay | Admitting: *Deleted

## 2023-04-23 NOTE — Telephone Encounter (Signed)
 DR. Smith Robert signed the bottom of form and ok to proceed and nothing to do about the [email or web address] fax transmission went through

## 2023-08-31 ENCOUNTER — Encounter (INDEPENDENT_AMBULATORY_CARE_PROVIDER_SITE_OTHER): Payer: Self-pay

## 2023-09-24 ENCOUNTER — Other Ambulatory Visit (INDEPENDENT_AMBULATORY_CARE_PROVIDER_SITE_OTHER): Payer: Self-pay | Admitting: Nurse Practitioner

## 2023-09-24 DIAGNOSIS — I7143 Infrarenal abdominal aortic aneurysm, without rupture: Secondary | ICD-10-CM

## 2023-09-28 ENCOUNTER — Ambulatory Visit (INDEPENDENT_AMBULATORY_CARE_PROVIDER_SITE_OTHER): Payer: Medicare Other | Admitting: Vascular Surgery

## 2023-09-28 ENCOUNTER — Encounter (INDEPENDENT_AMBULATORY_CARE_PROVIDER_SITE_OTHER): Payer: Self-pay | Admitting: Vascular Surgery

## 2023-09-28 ENCOUNTER — Ambulatory Visit (INDEPENDENT_AMBULATORY_CARE_PROVIDER_SITE_OTHER): Payer: Medicare Other

## 2023-09-28 VITALS — BP 120/75 | HR 60 | Resp 18 | Wt 157.6 lb

## 2023-09-28 DIAGNOSIS — I7143 Infrarenal abdominal aortic aneurysm, without rupture: Secondary | ICD-10-CM | POA: Diagnosis not present

## 2023-09-28 DIAGNOSIS — I700 Atherosclerosis of aorta: Secondary | ICD-10-CM | POA: Diagnosis not present

## 2023-09-28 NOTE — Assessment & Plan Note (Signed)
 His aortic duplex today shows a maximal diameter of the aorta just over 3.1 cm.  This is slightly smaller than his last check last year and clearly has not demonstrated any significant growth.  No role for intervention at this level.  Does not smoke and blood pressure control is good.  Follow-up in 1 year.

## 2023-09-28 NOTE — Assessment & Plan Note (Signed)
 No worrisome symptoms of occlusive disease.

## 2023-09-28 NOTE — Progress Notes (Signed)
 MRN : [id number]  [name] is a 73 y.o. (1950-09-02) male who presents with chief complaint of  Chief Complaint  Patient presents with   Follow-up    24yr AAA follow up  .  History of Present Illness: Patient returns today in follow up of his abdominal aortic aneurysm.  He is doing well today.  He denies any aneurysm related symptoms. Specifically, the patient denies new back or abdominal pain, or signs of peripheral embolization.  His aortic duplex today shows a maximal diameter of the aorta just over 3.1 cm.  This is slightly smaller than his last check last year and clearly has not demonstrated any significant growth.  Current Outpatient Medications  Medication Sig Dispense Refill   acetaminophen  (TYLENOL ) 500 MG tablet Take 500 mg by mouth every 6 (six) hours as needed.     pantoprazole (PROTONIX) 40 MG tablet Take 40 mg by mouth daily.      PARoxetine (PAXIL) 40 MG tablet Take 40 mg by mouth every morning.     No current facility-administered medications for this visit.    Past Medical History:  Diagnosis Date   Anemia    prev bone marrow bx 02/11/10   Anxiety    Bone cancer (HCC)    Colitis 2004   Colon polyps    Depression    Dysphagia    GERD (gastroesophageal reflux disease)    Need for prophylactic vaccination and inoculation against influenza 01/31/2013   Neuropathy    Sleep apnea 09-12-10   Uses CPAP   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Past Surgical History:  Procedure Laterality Date   CHOLECYSTECTOMY     COLONOSCOPY  03/17/2011   Procedure: COLONOSCOPY;  Surgeon: Mathew Solomon, MD;  Location: [facility] ENDOSCOPY;  Service: Endoscopy;  Laterality: N/A;   COLONOSCOPY N/A 06/12/2022   Procedure: COLONOSCOPY;  Surgeon: Quintin Buckle, DO;  Location: [facility] ENDOSCOPY;  Service: Gastroenterology;  Laterality: N/A;   COLONOSCOPY WITH PROPOFOL  N/A 10/07/2016   Procedure: COLONOSCOPY WITH PROPOFOL ;  Surgeon: Cassie Click, MD;  Location: [facility] ENDOSCOPY;   Service: Endoscopy;  Laterality: N/A;   ESOPHAGOGASTRODUODENOSCOPY N/A 06/12/2022   Procedure: ESOPHAGOGASTRODUODENOSCOPY (EGD);  Surgeon: Quintin Buckle, DO;  Location: [facility] ENDOSCOPY;  Service: Gastroenterology;  Laterality: N/A;  WITH DIL   ESOPHAGOGASTRODUODENOSCOPY (EGD) WITH PROPOFOL  N/A 10/07/2016   Procedure: ESOPHAGOGASTRODUODENOSCOPY (EGD) WITH PROPOFOL ;  Surgeon: Cassie Click, MD;  Location[facility] ENDOSCOPY;  Service: Endoscopy;  Laterality: N/A;   ESOPHAGOGASTRODUODENOSCOPY (EGD) WITH PROPOFOL  N/A 01/24/2018   Procedure: ESOPHAGOGASTRODUODENOSCOPY (EGD) WITH PROPOFOL  with Gastric Mapping;  Surgeon: Luke Salaam, MD;  Location: [facility] ENDOSCOPY;  Service: Gastroenterology;  Laterality: N/A;   FRACTURE SURGERY     HIP FRACTURE SURGERY  04/2011   R hip   HIP FRACTURE SURGERY Right 02/2016   JOINT REPLACEMENT     NASAL FRACTURE SURGERY     ROBOTIC ASSISTED LAPAROSCOPIC CHOLECYSTECTOMY-MULTI SITE N/A 03/17/2018   Procedure: ROBOTIC ASSISTED LAPAROSCOPIC CHOLECYSTECTOMY-MULTI SITE;  Surgeon: Alben Alma, MD;  Location: [facility] ORS;  Service: General;  Laterality: N/A;     Social History   Tobacco Use   Smoking status: Former    Current packs/day: 0.00    Types: Cigarettes    Quit date: 04/14/2007    Years since quitting: 16.4   Smokeless tobacco: Never  Vaping Use   Vaping status: Never Used  Substance Use Topics   Alcohol use: No   Drug use: No  Family History  Problem Relation Age of Onset   Prostate cancer Neg Hx    Bladder Cancer Neg Hx    Kidney cancer Neg Hx      Allergies  Allergen Reactions   Ibrutinib Nausea Only       REVIEW OF SYSTEMS (Negative unless checked)   Constitutional: [] Weight loss  [] Fever  [] Chills Cardiac: [] Chest pain   [] Chest pressure   [] Palpitations   [] Shortness of breath when laying flat   [] Shortness of breath at rest   [] Shortness of breath with exertion. Vascular:  [] Pain in legs with walking   [] Pain in legs at  rest   [] Pain in legs when laying flat   [] Claudication   [] Pain in feet when walking  [] Pain in feet at rest  [] Pain in feet when laying flat   [] History of DVT   [] Phlebitis   [] Swelling in legs   [] Varicose veins   [] Non-healing ulcers Pulmonary:   [] Uses home oxygen   [] Productive cough   [] Hemoptysis   [] Wheeze  [] COPD   [] Asthma Neurologic:  [] Dizziness  [] Blackouts   [] Seizures   [] History of stroke   [] History of TIA  [] Aphasia   [] Temporary blindness   [] Dysphagia   [] Weakness or numbness in arms   [] Weakness or numbness in legs Musculoskeletal:  [x] Arthritis   [] Joint swelling   [] Joint pain   [] Low back pain Hematologic:  [] Easy bruising  [] Easy bleeding   [] Hypercoagulable state   [x] Anemic  [] Hepatitis Gastrointestinal:  [] Blood in stool   [] Vomiting blood  [x] Gastroesophageal reflux/heartburn   [x] Abdominal pain Genitourinary:  [] Chronic kidney disease   [x] Difficult urination  [] Frequent urination  [] Burning with urination   [] Hematuria Skin:  [] Rashes   [] Ulcers   [] Wounds Psychological:  [x] History of anxiety   [x]  History of major depression.  Physical Examination  BP 120/75   Pulse 60   Resp 18   Wt 157 lb 9.6 oz (71.5 kg)   BMI 21.98 kg/m  Gen:  WD/WN, NAD. Appears younger than stated age. Head: [place]/AT, No temporalis wasting. Ear/Nose/Throat: Hearing grossly intact, nares w/o erythema or drainage Eyes: Conjunctiva clear. Sclera non-icteric Neck: Supple.  Trachea midline Pulmonary:  Good air movement, no use of accessory muscles.  Cardiac: RRR, no JVD Vascular:  Vessel Right Left  Radial Palpable Palpable                          PT Palpable Palpable  DP Palpable Palpable   Gastrointestinal: soft, non-tender/non-distended. No guarding/reflex. No increased aortic impulse Musculoskeletal: M/S 5/5 throughout.  No deformity or atrophy. No edema. Neurologic: Sensation grossly intact in extremities.  Symmetrical.  Speech is fluent.  Psychiatric: Judgment intact,  Mood & affect appropriate for pt's clinical situation. Dermatologic: No rashes or ulcers noted.  No cellulitis or open wounds.      Labs No results found for this or any previous visit (from the past 2160 hours).  Radiology No results found.  Assessment/Plan  AAA (abdominal aortic aneurysm) without rupture (HCC) His aortic duplex today shows a maximal diameter of the aorta just over 3.1 cm.  This is slightly smaller than his last check last year and clearly has not demonstrated any significant growth.  No role for intervention at this level.  Does not smoke and blood pressure control is good.  Follow-up in 1 year.  Aortic atherosclerosis (HCC) No worrisome symptoms of occlusive disease.    Mikki Alexander, MD  09/28/2023 9:27  AM    This note was created with Dragon medical transcription system.  Any errors from dictation are purely unintentional

## 2023-12-25 NOTE — Progress Notes (Unsigned)
 [facility] [address] [place], [place] [address]  Pulmonary Sleep Medicine   Office Visit Note  Patient Name: [name] DOB: Jun 28, 1950 MRN [id number]    Chief Complaint: Obstructive Sleep Apnea visit  Brief History:  Lamel is seen today for an annual follow up on CPAP @ 9 cmH2O. The patient has a 13 year history of sleep apnea. Patient is using PAP nightly.  The patient feels rested after sleeping with PAP.  The patient reports benefiting from PAP use. Reported sleepiness is  improved and the Epworth Sleepiness Score is 2 out of 24. The patient does not take naps. The patient complains of the following: none.  The compliance download shows 99% compliance with an average use time of 7 hours 37 minutes. The AHI is 4.4  The patient does not complain of limb movements disrupting sleep.  ROS  General: (-) fever, (-) chills, (-) night sweat Nose and Sinuses: (-) nasal stuffiness or itchiness, (-) postnasal drip, (-) nosebleeds, (-) sinus trouble. Mouth and Throat: (-) sore throat, (-) hoarseness. Neck: (-) swollen glands, (-) enlarged thyroid, (-) neck pain. Respiratory: - cough, - shortness of breath, - wheezing. Neurologic: - numbness, - tingling. Psychiatric: - anxiety, - depression   Current Medication: Outpatient Encounter Medications as of 12/27/2023  Medication Sig   acetaminophen  (TYLENOL ) 500 MG tablet Take 500 mg by mouth every 6 (six) hours as needed.   pantoprazole (PROTONIX) 40 MG tablet Take 40 mg by mouth daily.    PARoxetine (PAXIL) 40 MG tablet Take 40 mg by mouth every morning.   No facility-administered encounter medications on file as of 12/27/2023.    Surgical History: Past Surgical History:  Procedure Laterality Date   CHOLECYSTECTOMY     COLONOSCOPY  03/17/2011   Procedure: COLONOSCOPY;  Surgeon: Oliva FORBES Boots, MD;  Location: [facility] ENDOSCOPY;  Service: Endoscopy;  Laterality: N/A;   COLONOSCOPY N/A 06/12/2022   Procedure: COLONOSCOPY;   Surgeon: Onita Elspeth Sharper, DO;  Location: [facility] ENDOSCOPY;  Service: Gastroenterology;  Laterality: N/A;   COLONOSCOPY WITH PROPOFOL  N/A 10/07/2016   Procedure: COLONOSCOPY WITH PROPOFOL ;  Surgeon: Viktoria Lamar DASEN, MD;  Location: [facility] ENDOSCOPY;  Service: Endoscopy;  Laterality: N/A;   ESOPHAGOGASTRODUODENOSCOPY N/A 06/12/2022   Procedure: ESOPHAGOGASTRODUODENOSCOPY (EGD);  Surgeon: Onita Elspeth Sharper, DO;  Location: [facility] ENDOSCOPY;  Service: Gastroenterology;  Laterality: N/A;  WITH DIL   ESOPHAGOGASTRODUODENOSCOPY (EGD) WITH PROPOFOL  N/A 10/07/2016   Procedure: ESOPHAGOGASTRODUODENOSCOPY (EGD) WITH PROPOFOL ;  Surgeon: Viktoria Lamar DASEN, MD;  Location: [facility] ENDOSCOPY;  Service: Endoscopy;  Laterality: N/A;   ESOPHAGOGASTRODUODENOSCOPY (EGD) WITH PROPOFOL  N/A 01/24/2018   Procedure: ESOPHAGOGASTRODUODENOSCOPY (EGD) WITH PROPOFOL  with Gastric Mapping;  Surgeon: Therisa Bi, MD;  Location: [facility] ENDOSCOPY;  Service: Gastroenterology;  Laterality: N/A;   FRACTURE SURGERY     HIP FRACTURE SURGERY  04/2011   R hip   HIP FRACTURE SURGERY Right 02/2016   JOINT REPLACEMENT     NASAL FRACTURE SURGERY     ROBOTIC ASSISTED LAPAROSCOPIC CHOLECYSTECTOMY-MULTI SITE N/A 03/17/2018   Procedure: ROBOTIC ASSISTED LAPAROSCOPIC CHOLECYSTECTOMY-MULTI SITE;  Surgeon: Jordis Laneta FALCON, MD;  Location: [facility] ORS;  Service: General;  Laterality: N/A;    Medical History: Past Medical History:  Diagnosis Date   Anemia    prev bone marrow bx 02/11/10   Anxiety    Bone cancer (HCC)    Colitis 2004   Colon polyps    Depression    Dysphagia    GERD (gastroesophageal reflux disease)  Need for prophylactic vaccination and inoculation against influenza 01/31/2013   Neuropathy    Sleep apnea 09-12-10   Uses CPAP   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Family History: Non contributory to the present illness  Social History: Social History   Socioeconomic History   Marital status: Married    Spouse name:  Not on file   Number of children: Not on file   Years of education: Not on file   Highest education level: Not on file  Occupational History   Not on file  Tobacco Use   Smoking status: Former    Current packs/day: 0.00    Types: Cigarettes    Quit date: 04/14/2007    Years since quitting: 16.7   Smokeless tobacco: Never  Vaping Use   Vaping status: Never Used  Substance and Sexual Activity   Alcohol use: No   Drug use: No   Sexual activity: Not Currently  Other Topics Concern   Not on file  Social History Narrative   Not on file   Social Drivers of Health   Financial Resource Strain: Low Risk  (02/15/2023)   [facility] from [facility]   Overall Financial Resource Strain (CARDIA)    Difficulty of Paying Living Expenses: Not hard at all  Food Insecurity: No Food Insecurity (02/15/2023)   Received from [facility]   Hunger Vital Sign    Within the past 12 months, you worried that your food would run out before you got the money to buy more.: Never true    Within the past 12 months, the food you bought just didn't last and you didn't have money to get more.: Never true  Transportation Needs: No Transportation Needs (02/15/2023)   Received from [facility] - Transportation    In the past 12 months, has lack of transportation kept you from medical appointments or from getting medications?: No    Lack of Transportation (Non-Medical): No  Physical Activity: Not on file  Stress: Not on file  Social Connections: Not on file  Intimate Partner Violence: Not on file    Vital Signs: Blood pressure 130/76, pulse 85, resp. rate 16, height 5' 11 (1.803 m), weight 162 lb (73.5 kg), SpO2 98%. Body mass index is 22.59 kg/m.    Examination: General Appearance: The patient is well-developed, well-nourished, and in no distress. Neck Circumference: 39 cm Skin: Gross inspection of skin unremarkable. Head: normocephalic, no  gross deformities. Eyes: no gross deformities noted. ENT: ears appear grossly normal Neurologic: Alert and oriented. No involuntary movements.  STOP BANG RISK ASSESSMENT S (snore) Have you been told that you snore?     NO   T (tired) Are you often tired, fatigued, or sleepy during the day?   NO  O (obstruction) Do you stop breathing, choke, or gasp during sleep? NO   P (pressure) Do you have or are you being treated for high blood pressure? NO   B (BMI) Is your body index greater than 35 kg/m? NO   A (age) Are you 62 years old or older? YES   N (neck) Do you have a neck circumference greater than 16 inches?   NO   G (gender) Are you a male? YES   TOTAL STOP/BANG "YES" ANSWERS 2       A STOP-Bang score of 2 or less is considered low risk, and a score of 5 or more is high risk for having either moderate or  severe OSA. For people who score 3 or 4, doctors may need to perform further assessment to determine how likely they are to have OSA.         EPWORTH SLEEPINESS SCALE:  Scale:  (0)= no chance of dozing; (1)= slight chance of dozing; (2)= moderate chance of dozing; (3)= high chance of dozing  Chance  Situtation    Sitting and reading: 1    Watching TV: 1    Sitting Inactive in public: 0    As a passenger in car: 0      Lying down to rest: 0    Sitting and talking: 0    Sitting quielty after lunch: 0    In a car, stopped in traffic: 0   TOTAL SCORE:   2 out of 24    SLEEP STUDIES:  PSG (09/2010) AHI 16.3/hr, min SpO2 82% Titration (09/2010) CPAP@7  cmH2O   CPAP COMPLIANCE DATA:  Date Range: 12/24/2022 - 12/23/2023  Average Daily Use: 7 hours 38 minutes  Median Use: 7 hours 34 inutes  Compliance for > 4 Hours: 99% days  AHI: 4.4 respiratory events per hour  Days Used: 364/365  Mask Leak: 51.6  95th Percentile Pressure: 9 cmH2O         LABS: No results found for this or any previous visit (from the past 2160 hours).  Radiology: MR  Cervical Spine Wo Contrast Result Date: 03/29/2023 CLINICAL DATA:  Neck pain, acute, known malignancy; Myelopathy, acute, thoracic spine EXAM: MRI CERVICAL AND THORACIC SPINE WITHOUT CONTRAST TECHNIQUE: Multiplanar and multiecho pulse sequences of the cervical spine, to include the craniocervical junction and cervicothoracic junction, and the thoracic spine, were obtained without intravenous contrast. COMPARISON:  Same day CT thoracic and cervical spine FINDINGS: MRI CERVICAL SPINE FINDINGS Alignment: Physiologic. Vertebrae: No fracture, evidence of discitis, or bone lesion. Cord: Normal signal and morphology. Posterior Fossa, vertebral arteries, paraspinal tissues: Negative. Disc levels: C1-C2: Mild-to-moderate degenerative change. C2-C3: Unremarkable C3-C4: Unremarkable C4-C5: Mild bilateral facet degenerative change. No significant disc bulge. No spinal canal narrowing. No significant neural foraminal narrowing. C5-C6: Mild bilateral facet degenerative change. Minimal disc bulge. No spinal canal narrowing. Mild left neural foraminal narrowing. C6-C7: Mild bilateral facet degenerative change. Uncovertebral hypertrophy. Minimal disc bulge. No significant spinal canal narrowing. Moderate to severe left and moderate right neural foraminal narrowing C7-T1: Unremarkable MRI THORACIC SPINE FINDINGS Alignment:  Physiologic. Vertebrae: No fracture, evidence of discitis, or bone lesion. Cord:  Normal signal and morphology. Paraspinal and other soft tissues: Negative. Disc levels: No evidence of high-grade spinal canal or neural foraminal stenosis IMPRESSION: 1. No evidence of metastatic disease in the cervical or thoracic spine. 2. Moderate to severe left and moderate right neural foraminal narrowing at C6-C7. 3. No evidence of high-grade spinal canal or neural foraminal stenosis in the thoracic spine. Electronically Signed   By: Lyndall Gore M.D.   On: 03/29/2023 20:26   MR THORACIC SPINE WO CONTRAST Result Date:  03/29/2023 CLINICAL DATA:  Neck pain, acute, known malignancy; Myelopathy, acute, thoracic spine EXAM: MRI CERVICAL AND THORACIC SPINE WITHOUT CONTRAST TECHNIQUE: Multiplanar and multiecho pulse sequences of the cervical spine, to include the craniocervical junction and cervicothoracic junction, and the thoracic spine, were obtained without intravenous contrast. COMPARISON:  Same day CT thoracic and cervical spine FINDINGS: MRI CERVICAL SPINE FINDINGS Alignment: Physiologic. Vertebrae: No fracture, evidence of discitis, or bone lesion. Cord: Normal signal and morphology. Posterior Fossa, vertebral arteries, paraspinal tissues: Negative. Disc levels: C1-C2: Mild-to-moderate degenerative change. C2-C3:  Unremarkable C3-C4: Unremarkable C4-C5: Mild bilateral facet degenerative change. No significant disc bulge. No spinal canal narrowing. No significant neural foraminal narrowing. C5-C6: Mild bilateral facet degenerative change. Minimal disc bulge. No spinal canal narrowing. Mild left neural foraminal narrowing. C6-C7: Mild bilateral facet degenerative change. Uncovertebral hypertrophy. Minimal disc bulge. No significant spinal canal narrowing. Moderate to severe left and moderate right neural foraminal narrowing C7-T1: Unremarkable MRI THORACIC SPINE FINDINGS Alignment:  Physiologic. Vertebrae: No fracture, evidence of discitis, or bone lesion. Cord:  Normal signal and morphology. Paraspinal and other soft tissues: Negative. Disc levels: No evidence of high-grade spinal canal or neural foraminal stenosis IMPRESSION: 1. No evidence of metastatic disease in the cervical or thoracic spine. 2. Moderate to severe left and moderate right neural foraminal narrowing at C6-C7. 3. No evidence of high-grade spinal canal or neural foraminal stenosis in the thoracic spine. Electronically Signed   By: Lyndall Gore M.D.   On: 03/29/2023 20:26   DG Chest 1 View Result Date: 03/29/2023 CLINICAL DATA:  Cough and weakness. EXAM:  CHEST  1 VIEW COMPARISON:  01/01/2023 FINDINGS: Normal sized heart. Stable biapical pleural and parenchymal scarring. Otherwise, clear lungs with normal vascularity. The lungs remain hyperexpanded with stable mild peribronchial thickening. No acute bony abnormality. IMPRESSION: 1. No acute abnormality. 2. COPD. 3. Stable mild bronchitic changes. Electronically Signed   By: Elspeth Bathe M.D.   On: 03/29/2023 15:56   MR BRAIN WO CONTRAST Result Date: 03/29/2023 CLINICAL DATA:  Mental status change, unknown cause. Dizziness and nausea. EXAM: MRI HEAD WITHOUT CONTRAST TECHNIQUE: Multiplanar, multiecho pulse sequences of the brain and surrounding structures were obtained without intravenous contrast. COMPARISON:  Head CT 03/29/2023 and MRI 03/28/2015 FINDINGS: Brain: There is no evidence of an acute infarct, mass, midline shift, or extra-axial fluid collection. A small chronic cortical infarct in the superior aspect of the right occipital lobe with suspected minimal chronic blood products is new from the 2016 MRI. Small chronic left cerebellar infarcts are again noted. Scattered small T2 hyperintensities in the cerebral white matter and pons are nonspecific but compatible with minimal chronic small vessel ischemic disease. Cerebral volume is within normal limits for age with normal size of the ventricles. Vascular: Major intracranial vascular flow voids are preserved. Skull and upper cervical spine: Unremarkable bone marrow signal. Sinuses/Orbits: Unremarkable orbits. Circumferential mucosal thickening and fluid in the left maxillary sinus. Trace left mastoid fluid. Other: None. IMPRESSION: 1. No acute intracranial abnormality. 2. 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 3. Left maxillary sinusitis. Electronically Signed   By: Dasie Hamburg M.D.   On: 03/29/2023 15:14   MR Lumbar Spine W Wo Contrast Result Date: 03/29/2023 CLINICAL DATA:  Hematologic malignancy, monitor EXAM: MRI LUMBAR SPINE  WITHOUT AND WITH CONTRAST TECHNIQUE: Multiplanar and multiecho pulse sequences of the lumbar spine were obtained without and with intravenous contrast. CONTRAST:  7mL GADAVIST  GADOBUTROL  1 MMOL/ML IV SOLN COMPARISON:  Same day lumbar spine CT FINDINGS: Segmentation:  Standard. Alignment:  Physiologic. Vertebrae: Negative for an acute fracture. No evidence of discitis or osteomyelitis. In the region the superior endplate compression deformity seen same day lumbar spine CT there is T2 hyperintense signal within the superior endplate in the L3-L4 disc space. There is also contrast enhancement associated with this region. Findings are favored to represent an acute Schmorl's node, which be a source of pain. Conus medullaris and cauda equina: Conus extends to the L1-L2 disc space level. Conus and cauda equina appear normal. Paraspinal and other soft  tissues: Negative. Disc levels: T12-L1: Mild bilateral facet degenerative change. No spinal canal narrowing. No neural foraminal narrowing. L1-L2: Mild bilateral facet degenerative change. No spinal canal narrowing. No neural foraminal narrowing. L2-L3: Mild bilateral facet degenerative change. No significant disc bulge. No spinal canal narrowing. No neural foraminal narrowing. L3-L4: Mild bilateral facet degenerative change. No significant disc bulge. Mild canal narrowing. Mild-to-moderate right neural foraminal narrowing. L4-L5: Mild bilateral facet degenerative change. Minimal disc bulge. No significant spinal canal narrowing. Mild-to-moderate right neural foraminal narrowing. L5-S1: Mild bilateral facet degenerative change. No significant disc bulge. No spinal canal narrowing. Mild-to-moderate right neural foraminal narrowing. IMPRESSION: 1. No evidence of metastatic disease in the lumbar spine. 2. Findings reported on same day lumbar spine CT of the L4 vertebral body level are favored to represent an inflammatory Schmorl's node, which could be a source of pain. No  evidence of a pathologic compression deformity. Electronically Signed   By: Lyndall Gore M.D.   On: 03/29/2023 14:34   CT HEAD WO CONTRAST ( ) Result Date: 03/29/2023 CLINICAL DATA:  Neuro deficit, acute, stroke suspected; Cervical radiculopathy, known malignancy; Mid-back pain, neuro deficit; Low back pain, cauda equina syndrome suspected EXAM: CT HEAD WITHOUT CONTRAST CT CERVICAL SPINE WITHOUT CONTRAST CT THORACIC SPINE WITHOUT CONTRAST CT LUMBAR SPINE WITHOUT CONTRAST TECHNIQUE: Multidetector CT imaging of the head, cervical spine, thoracic spine, and lumbar spine was performed following the standard protocol without intravenous contrast. Multiplanar CT image reconstructions of the cervical spine were also generated. RADIATION DOSE REDUCTION: This exam was performed according to the departmental dose-optimization program which includes automated exposure control, adjustment of the mA and/or kV according to patient size and/or use of iterative reconstruction technique. COMPARISON:  MR T Spine 03/28/15, CT CAP 07/22/21 FINDINGS: CT HEAD FINDINGS Brain: No hemorrhage. No hydrocephalus. No extra-axial fluid collection. No CT evidence of an acute cortical infarct. No mass effect. No mass lesion. Vascular: No hyperdense vessel or unexpected calcification. Skull: Normal. Negative for fracture or focal lesion. Sinuses/Orbits: No middle ear or mastoid effusion. Paranasal sinuses are notable for frothy secretions left maxillary sinus, which can be seen in the setting of acute sinusitis. Other: None. CT CERVICAL SPINE FINDINGS Alignment: Normal. Skull base and vertebrae: No acute fracture. No primary bone lesion or focal pathologic process. Soft tissues and spinal canal: No prevertebral fluid or swelling. No visible canal hematoma. Disc levels:  No CT evidence of high-grade spinal canal stenosis Upper chest: Biapical pleural-parenchymal scarring CT THORACIC SPINE FINDINGS Alignment: Normal. Vertebrae: No acute  fracture or focal pathologic process. Paraspinal and other soft tissues: Negative. Disc levels: No evidence of high-grade spinal canal stenosis CT LUMBAR SPINE FINDINGS Segmentation: 5 lumbar type vertebrae. Alignment: Normal. Vertebrae: Compared to prior exam there is a new superior endplate compression deformity at L4. There appears to be an underlying lucent lesion, which could suggest a pathologic compression deformity. Recommend further evaluation with a contrast-enhanced lumbar spine MRI Paraspinal and other soft tissues: Negative. Disc levels: Likely mild-to-moderate spinal canal narrowing at L3-L4 and L4-L5. IMPRESSION: 1. No CT evidence of intracranial injury. 2. No acute fracture or traumatic listhesis of the cervical or thoracic spine. 3. Compared to prior exam there is a new superior endplate compression deformity at L4. There appears to be an underlying lucent lesion, which could suggest a pathologic compression deformity. Recommend further evaluation with a contrast-enhanced lumbar spine MRI. 4. Frothy secretions in the left maxillary sinus, which can be seen in the setting of acute sinusitis. Electronically Signed  By: Hemant  Desai M.D.   On: 03/29/2023 12:53   CT Cervical Spine Wo Contrast Result Date: 03/29/2023 CLINICAL DATA:  Neuro deficit, acute, stroke suspected; Cervical radiculopathy, known malignancy; Mid-back pain, neuro deficit; Low back pain, cauda equina syndrome suspected EXAM: CT HEAD WITHOUT CONTRAST CT CERVICAL SPINE WITHOUT CONTRAST CT THORACIC SPINE WITHOUT CONTRAST CT LUMBAR SPINE WITHOUT CONTRAST TECHNIQUE: Multidetector CT imaging of the head, cervical spine, thoracic spine, and lumbar spine was performed following the standard protocol without intravenous contrast. Multiplanar CT image reconstructions of the cervical spine were also generated. RADIATION DOSE REDUCTION: This exam was performed according to the departmental dose-optimization program which includes automated  exposure control, adjustment of the mA and/or kV according to patient size and/or use of iterative reconstruction technique. COMPARISON:  MR T Spine 03/28/15, CT CAP 07/22/21 FINDINGS: CT HEAD FINDINGS Brain: No hemorrhage. No hydrocephalus. No extra-axial fluid collection. No CT evidence of an acute cortical infarct. No mass effect. No mass lesion. Vascular: No hyperdense vessel or unexpected calcification. Skull: Normal. Negative for fracture or focal lesion. Sinuses/Orbits: No middle ear or mastoid effusion. Paranasal sinuses are notable for frothy secretions left maxillary sinus, which can be seen in the setting of acute sinusitis. Other: None. CT CERVICAL SPINE FINDINGS Alignment: Normal. Skull base and vertebrae: No acute fracture. No primary bone lesion or focal pathologic process. Soft tissues and spinal canal: No prevertebral fluid or swelling. No visible canal hematoma. Disc levels:  No CT evidence of high-grade spinal canal stenosis Upper chest: Biapical pleural-parenchymal scarring CT THORACIC SPINE FINDINGS Alignment: Normal. Vertebrae: No acute fracture or focal pathologic process. Paraspinal and other soft tissues: Negative. Disc levels: No evidence of high-grade spinal canal stenosis CT LUMBAR SPINE FINDINGS Segmentation: 5 lumbar type vertebrae. Alignment: Normal. Vertebrae: Compared to prior exam there is a new superior endplate compression deformity at L4. There appears to be an underlying lucent lesion, which could suggest a pathologic compression deformity. Recommend further evaluation with a contrast-enhanced lumbar spine MRI Paraspinal and other soft tissues: Negative. Disc levels: Likely mild-to-moderate spinal canal narrowing at L3-L4 and L4-L5. IMPRESSION: 1. No CT evidence of intracranial injury. 2. No acute fracture or traumatic listhesis of the cervical or thoracic spine. 3. Compared to prior exam there is a new superior endplate compression deformity at L4. There appears to be an  underlying lucent lesion, which could suggest a pathologic compression deformity. Recommend further evaluation with a contrast-enhanced lumbar spine MRI. 4. Frothy secretions in the left maxillary sinus, which can be seen in the setting of acute sinusitis. Electronically Signed   By: Lyndall Gore M.D.   On: 03/29/2023 12:53   CT Thoracic Spine Wo Contrast Result Date: 03/29/2023 CLINICAL DATA:  Neuro deficit, acute, stroke suspected; Cervical radiculopathy, known malignancy; Mid-back pain, neuro deficit; Low back pain, cauda equina syndrome suspected EXAM: CT HEAD WITHOUT CONTRAST CT CERVICAL SPINE WITHOUT CONTRAST CT THORACIC SPINE WITHOUT CONTRAST CT LUMBAR SPINE WITHOUT CONTRAST TECHNIQUE: Multidetector CT imaging of the head, cervical spine, thoracic spine, and lumbar spine was performed following the standard protocol without intravenous contrast. Multiplanar CT image reconstructions of the cervical spine were also generated. RADIATION DOSE REDUCTION: This exam was performed according to the departmental dose-optimization program which includes automated exposure control, adjustment of the mA and/or kV according to patient size and/or use of iterative reconstruction technique. COMPARISON:  MR T Spine 03/28/15, CT CAP 07/22/21 FINDINGS: CT HEAD FINDINGS Brain: No hemorrhage. No hydrocephalus. No extra-axial fluid collection. No CT evidence of an  acute cortical infarct. No mass effect. No mass lesion. Vascular: No hyperdense vessel or unexpected calcification. Skull: Normal. Negative for fracture or focal lesion. Sinuses/Orbits: No middle ear or mastoid effusion. Paranasal sinuses are notable for frothy secretions left maxillary sinus, which can be seen in the setting of acute sinusitis. Other: None. CT CERVICAL SPINE FINDINGS Alignment: Normal. Skull base and vertebrae: No acute fracture. No primary bone lesion or focal pathologic process. Soft tissues and spinal canal: No prevertebral fluid or swelling. No  visible canal hematoma. Disc levels:  No CT evidence of high-grade spinal canal stenosis Upper chest: Biapical pleural-parenchymal scarring CT THORACIC SPINE FINDINGS Alignment: Normal. Vertebrae: No acute fracture or focal pathologic process. Paraspinal and other soft tissues: Negative. Disc levels: No evidence of high-grade spinal canal stenosis CT LUMBAR SPINE FINDINGS Segmentation: 5 lumbar type vertebrae. Alignment: Normal. Vertebrae: Compared to prior exam there is a new superior endplate compression deformity at L4. There appears to be an underlying lucent lesion, which could suggest a pathologic compression deformity. Recommend further evaluation with a contrast-enhanced lumbar spine MRI Paraspinal and other soft tissues: Negative. Disc levels: Likely mild-to-moderate spinal canal narrowing at L3-L4 and L4-L5. IMPRESSION: 1. No CT evidence of intracranial injury. 2. No acute fracture or traumatic listhesis of the cervical or thoracic spine. 3. Compared to prior exam there is a new superior endplate compression deformity at L4. There appears to be an underlying lucent lesion, which could suggest a pathologic compression deformity. Recommend further evaluation with a contrast-enhanced lumbar spine MRI. 4. Frothy secretions in the left maxillary sinus, which can be seen in the setting of acute sinusitis. Electronically Signed   By: Lyndall Gore M.D.   On: 03/29/2023 12:53   CT Lumbar Spine Wo Contrast Result Date: 03/29/2023 CLINICAL DATA:  Neuro deficit, acute, stroke suspected; Cervical radiculopathy, known malignancy; Mid-back pain, neuro deficit; Low back pain, cauda equina syndrome suspected EXAM: CT HEAD WITHOUT CONTRAST CT CERVICAL SPINE WITHOUT CONTRAST CT THORACIC SPINE WITHOUT CONTRAST CT LUMBAR SPINE WITHOUT CONTRAST TECHNIQUE: Multidetector CT imaging of the head, cervical spine, thoracic spine, and lumbar spine was performed following the standard protocol without intravenous contrast.  Multiplanar CT image reconstructions of the cervical spine were also generated. RADIATION DOSE REDUCTION: This exam was performed according to the departmental dose-optimization program which includes automated exposure control, adjustment of the mA and/or kV according to patient size and/or use of iterative reconstruction technique. COMPARISON:  MR T Spine 03/28/15, CT CAP 07/22/21 FINDINGS: CT HEAD FINDINGS Brain: No hemorrhage. No hydrocephalus. No extra-axial fluid collection. No CT evidence of an acute cortical infarct. No mass effect. No mass lesion. Vascular: No hyperdense vessel or unexpected calcification. Skull: Normal. Negative for fracture or focal lesion. Sinuses/Orbits: No middle ear or mastoid effusion. Paranasal sinuses are notable for frothy secretions left maxillary sinus, which can be seen in the setting of acute sinusitis. Other: None. CT CERVICAL SPINE FINDINGS Alignment: Normal. Skull base and vertebrae: No acute fracture. No primary bone lesion or focal pathologic process. Soft tissues and spinal canal: No prevertebral fluid or swelling. No visible canal hematoma. Disc levels:  No CT evidence of high-grade spinal canal stenosis Upper chest: Biapical pleural-parenchymal scarring CT THORACIC SPINE FINDINGS Alignment: Normal. Vertebrae: No acute fracture or focal pathologic process. Paraspinal and other soft tissues: Negative. Disc levels: No evidence of high-grade spinal canal stenosis CT LUMBAR SPINE FINDINGS Segmentation: 5 lumbar type vertebrae. Alignment: Normal. Vertebrae: Compared to prior exam there is a new superior endplate compression deformity at  L4. There appears to be an underlying lucent lesion, which could suggest a pathologic compression deformity. Recommend further evaluation with a contrast-enhanced lumbar spine MRI Paraspinal and other soft tissues: Negative. Disc levels: Likely mild-to-moderate spinal canal narrowing at L3-L4 and L4-L5. IMPRESSION: 1. No CT evidence of  intracranial injury. 2. No acute fracture or traumatic listhesis of the cervical or thoracic spine. 3. Compared to prior exam there is a new superior endplate compression deformity at L4. There appears to be an underlying lucent lesion, which could suggest a pathologic compression deformity. Recommend further evaluation with a contrast-enhanced lumbar spine MRI. 4. Frothy secretions in the left maxillary sinus, which can be seen in the setting of acute sinusitis. Electronically Signed   By: Lyndall Gore M.D.   On: 03/29/2023 12:53    No results found.  No results found.    Assessment and Plan: Patient Active Problem List   Diagnosis Date Noted   OSA on CPAP 12/22/2021   CPAP use counseling 12/22/2021   History of right hip replacement 09/23/2021   Anxiety 09/23/2021   Encounter for immunization 09/23/2021   Other specified cataract 09/23/2021   Preglaucoma, unspecified, bilateral 09/23/2021   Unilateral primary osteoarthritis, right knee 09/23/2021   Gastro-esophageal reflux disease without esophagitis 09/23/2021   Gastroesophageal reflux disease 09/23/2021   Pain in right knee 09/23/2021   History of total hip arthroplasty 04/08/2018   Biliary dyskinesia    Right hip pain 07/10/2017   Diverticulosis of sigmoid colon 02/12/2017   Influenza vaccine administered 01/25/2017   AAA (abdominal aortic aneurysm) without rupture (HCC) 01/13/2017   Prostatitis 01/12/2017   Aortic atherosclerosis (HCC) 01/12/2017   Urinary dysfunction 12/11/2016   Testicular asymmetry 12/11/2016   Enteritis 12/11/2016   Diarrhea, functional 12/11/2016   Abnormal CT scan, gastrointestinal tract 12/11/2016   Abdominal pain, diffuse 12/11/2016   Erosive gastritis 12/03/2016   Bilateral lower abdominal pain 12/03/2016   History of colonic polyps 07/27/2016   Abnormal CAT scan 09/17/2013   Abnormal CT scan, chest 09/17/2013   Waldenstrom macroglobulinemia 09/16/2013   Need for prophylactic vaccination and  inoculation against influenza 01/31/2013   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GERD (gastroesophageal reflux disease)     1. OSA on CPAP (Primary) The patient does tolerate PAP and reports  benefit from PAP use. The patient was reminded how to clean equipment and advised to replace supplies more frequently as he has mask leak.  The compliance is excellent. The AHI is 4.4.   OSA on cpap- controlled. Continue with excellent compliance with pap. CPAP continues to be medically necessary to treat this patient's OSA. F/u one year.    2. CPAP use counseling CPAP Counseling: had a lengthy discussion with the patient regarding the importance of PAP therapy in management of the sleep apnea. Patient appears to understand the risk factor reduction and also understands the risks associated with untreated sleep apnea. Patient will try to make a good faith effort to remain compliant with therapy. Also instructed the patient on proper cleaning of the device including the water must be changed daily if possible and use of distilled water is preferred. Patient understands that the machine should be regularly cleaned with appropriate recommended cleaning solutions that do not damage the PAP machine for example given white vinegar and water rinses. Other methods such as ozone treatment may not be as good as these simple methods to achieve cleaning.     General Counseling: I have discussed the findings of the evaluation and  examination with Elsie.  I have also discussed any further diagnostic evaluation thatmay be needed or ordered today. Finnean verbalizes understanding of the findings of todays visit. We also reviewed his medications today and discussed drug interactions and side effects including but not limited excessive drowsiness and altered mental states. We also discussed that there is always a risk not just to him but also people around him. he has been encouraged to call the office with any questions or  concerns that should arise related to todays visit.  No orders of the defined types were placed in this encounter.       I have personally obtained a history, examined the patient, evaluated laboratory and imaging results, formulated the assessment and plan and placed orders. This patient was seen today by Lauraine Lay, PA-C in collaboration with Dr. Elfreda Bathe.   Elfreda DELENA Bathe, MD [facility] Diplomate ABMS Pulmonary Critical Care Medicine and Sleep Medicine

## 2023-12-27 ENCOUNTER — Ambulatory Visit (INDEPENDENT_AMBULATORY_CARE_PROVIDER_SITE_OTHER): Admitting: Internal Medicine

## 2023-12-27 VITALS — BP 130/76 | HR 85 | Resp 16 | Ht 71.0 in | Wt 162.0 lb

## 2023-12-27 DIAGNOSIS — Z7189 Other specified counseling: Secondary | ICD-10-CM | POA: Diagnosis not present

## 2023-12-27 DIAGNOSIS — G4733 Obstructive sleep apnea (adult) (pediatric): Secondary | ICD-10-CM | POA: Diagnosis not present

## 2023-12-27 NOTE — Patient Instructions (Signed)

## 2024-03-24 ENCOUNTER — Encounter: Payer: Self-pay | Admitting: Oncology

## 2024-03-24 ENCOUNTER — Other Ambulatory Visit: Payer: Medicare Other | Attending: Internal Medicine

## 2024-03-24 ENCOUNTER — Inpatient Hospital Stay: Payer: Medicare Other | Admitting: Oncology

## 2024-03-24 VITALS — BP 137/81 | HR 79 | Temp 96.1°F | Resp 18 | Ht 71.0 in | Wt 164.1 lb

## 2024-03-24 DIAGNOSIS — Z8579 Personal history of other malignant neoplasms of lymphoid, hematopoietic and related tissues: Secondary | ICD-10-CM | POA: Insufficient documentation

## 2024-03-24 DIAGNOSIS — C8800 Waldenstrom macroglobulinemia not having achieved remission: Secondary | ICD-10-CM

## 2024-03-24 DIAGNOSIS — Z87891 Personal history of nicotine dependence: Secondary | ICD-10-CM | POA: Diagnosis not present

## 2024-03-24 LAB — COMPREHENSIVE METABOLIC PANEL WITH GFR
ALT: 18 U/L (ref 0–44)
AST: 27 U/L (ref 15–41)
Albumin: 4.7 g/dL (ref 3.5–5.0)
Alkaline Phosphatase: 104 U/L (ref 38–126)
Anion gap: 13 (ref 5–15)
BUN: 25 mg/dL — ABNORMAL HIGH (ref 8–23)
CO2: 26 mmol/L (ref 22–32)
Calcium: 9.5 mg/dL (ref 8.9–10.3)
Chloride: 102 mmol/L (ref 98–111)
Creatinine, Ser: 1.05 mg/dL (ref 0.61–1.24)
GFR, Estimated: >60 mL/min (ref >60)
Glucose, Bld: 102 mg/dL — ABNORMAL HIGH (ref 70–99)
Potassium: 4.3 mmol/L (ref 3.5–5.1)
Sodium: 141 mmol/L (ref 135–145)
Total Bilirubin: 1.4 mg/dL — ABNORMAL HIGH (ref 0.0–1.2)
Total Protein: 7.7 g/dL (ref 6.5–8.1)

## 2024-03-24 LAB — CBC WITH DIFFERENTIAL/PLATELET
Abs Immature Granulocytes: 0.02 K/uL (ref 0.00–0.07)
Basophils Absolute: 0 K/uL (ref 0.0–0.1)
Basophils Relative: 0 %
Eosinophils Absolute: 0 K/uL (ref 0.0–0.5)
Eosinophils Relative: 1 %
HCT: 41.6 % (ref 39.0–52.0)
Hemoglobin: 13.9 g/dL (ref 13.0–17.0)
Immature Granulocytes: 0 %
Lymphocytes Relative: 22 %
Lymphs Abs: 1.4 K/uL (ref 0.7–4.0)
MCH: 30.2 pg (ref 26.0–34.0)
MCHC: 33.4 g/dL (ref 30.0–36.0)
MCV: 90.2 fL (ref 80.0–100.0)
Monocytes Absolute: 0.5 K/uL (ref 0.1–1.0)
Monocytes Relative: 8 %
Neutro Abs: 4.4 K/uL (ref 1.7–7.7)
Neutrophils Relative %: 69 %
Platelets: 360 K/uL (ref 150–400)
RBC: 4.61 MIL/uL (ref 4.22–5.81)
RDW: 14 % (ref 11.5–15.5)
WBC: 6.3 K/uL (ref 4.0–10.5)
nRBC: 0 % (ref 0.0–0.2)

## 2024-03-24 NOTE — Addendum Note (Signed)
 Addended by: Mosella Kasa on: 03/24/2024 11:55 AM   Modules accepted: Orders

## 2024-03-24 NOTE — Progress Notes (Signed)
 Hematology/Oncology Consult note [facility]  Telephone:([phone] [phone]  Patient Care Team: Valora Lynwood FALCON, MD as PCP - General (Family Medicine) Rosalie Kitchens, MD (Gastroenterology) Maree Jannett POUR, MD as Consulting Physician (Neurology) Richie Sharper, MD as Referring Physician (Orthopedic Surgery) Moishe Suzen LABOR, NP (Inactive) as Nurse Practitioner (Nurse Practitioner) Penne Knee, MD (Inactive) as Consulting Physician (Urology) Marea Selinda RAMAN, MD as Referring Physician (Vascular Surgery) Melanee Annah BROCKS, MD as Consulting Physician (Oncology)   Name of the patient: [name]  [id number]  [birth year]   Date of visit: 03/24/2024  Diagnosis-history of Waldenstrm's macroglobulinemia  Chief complaint/ Reason for visit-routine follow-up of Waldenstrm's macroglobulinemia presently in remission  Heme/Onc history: Patient is a 73 year old male with a history of Walden Strom's macroglobulinemia and was seeing Dr. Rudell previously and is transferring his care to me.  He was initially diagnosed with Elpidio Congress macroglobulinemia in 2012 when he presented with headache and visual changes.  He received weekly Rituxan  x4 followed by maintenance Rituxan  for 2 years until March 2014.  He had recurrence of dizziness and vertigo in June 2015 and underwent plasmapheresis at [facility] with improvement of symptoms and he was retreated with Rituxan  x4.  He completed this treatment in July 2015.  He was started on maintenance ibrutinib in November 2015 which he did not tolerate.  Maintenance Rituxan  was started thereafter and stopped in November 2016.  His IgM levels have been between 3000-5000 when he is symptomatic and presently in the 400s.  Patient also has chronic abdominal pain and CT scans have been unrevealing.     Interval history-patient is doing well for his age and denies any changes in his appetite or weight.  Denies any lumps or bumps  anywhere.  No recent hospitalizations headaches or neurological symptoms.  ECOG PS- 1 Pain scale- 0   Review of systems- Review of Systems  Constitutional:  Negative for chills, fever, malaise/fatigue and weight loss.  HENT:  Negative for congestion, ear discharge and nosebleeds.   Eyes:  Negative for blurred vision.  Respiratory:  Negative for cough, hemoptysis, sputum production, shortness of breath and wheezing.   Cardiovascular:  Negative for chest pain, palpitations, orthopnea and claudication.  Gastrointestinal:  Negative for abdominal pain, blood in stool, constipation, diarrhea, heartburn, melena, nausea and vomiting.  Genitourinary:  Negative for dysuria, flank pain, frequency, hematuria and urgency.  Musculoskeletal:  Negative for back pain, joint pain and myalgias.  Skin:  Negative for rash.  Neurological:  Negative for dizziness, tingling, focal weakness, seizures, weakness and headaches.  Endo/Heme/Allergies:  Does not bruise/bleed easily.  Psychiatric/Behavioral:  Negative for depression and suicidal ideas. The patient does not have insomnia.       Allergies[1]   Past Medical History:  Diagnosis Date   Anemia    prev bone marrow bx 02/11/10   Anxiety    Bone cancer (HCC)    Colitis 2004   Colon polyps    Depression    Dysphagia    GERD (gastroesophageal reflux disease)    Need for prophylactic vaccination and inoculation against influenza 01/31/2013   Neuropathy    Sleep apnea 09-12-10   Uses CPAP   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[facility])      Past Surgical History:  Procedure Laterality Date   CHOLECYSTECTOMY     COLONOSCOPY  03/17/2011   Procedure: COLONOSCOPY;  Surgeon: Kitchens FORBES Rosalie, MD;  Location: [facility] ENDOSCOPY;  Service: Endoscopy;  Laterality: N/A;   COLONOSCOPY N/A 06/12/2022  Procedure: COLONOSCOPY;  Surgeon: Onita Elspeth Sharper, DO;  Location: [facility] ENDOSCOPY;  Service: Gastroenterology;  Laterality: N/A;   COLONOSCOPY WITH PROPOFOL  N/A 10/07/2016    Procedure: COLONOSCOPY WITH PROPOFOL ;  Surgeon: Viktoria Lamar DASEN, MD;  Location: [facility] ENDOSCOPY;  Service: Endoscopy;  Laterality: N/A;   ESOPHAGOGASTRODUODENOSCOPY N/A 06/12/2022   Procedure: ESOPHAGOGASTRODUODENOSCOPY (EGD);  Surgeon: Onita Elspeth Sharper, DO;  Location: [facility] ENDOSCOPY;  Service: Gastroenterology;  Laterality: N/A;  WITH DIL   ESOPHAGOGASTRODUODENOSCOPY (EGD) WITH PROPOFOL  N/A 10/07/2016   Procedure: ESOPHAGOGASTRODUODENOSCOPY (EGD) WITH PROPOFOL ;  Surgeon: Viktoria Lamar DASEN, MD;  Location: [facility] ENDOSCOPY;  Service: Endoscopy;  Laterality: N/A;   ESOPHAGOGASTRODUODENOSCOPY (EGD) WITH PROPOFOL  N/A 01/24/2018   Procedure: ESOPHAGOGASTRODUODENOSCOPY (EGD) WITH PROPOFOL  with Gastric Mapping;  Surgeon: Therisa Bi, MD;  Location: [facility] ENDOSCOPY;  Service: Gastroenterology;  Laterality: N/A;   FRACTURE SURGERY     HIP FRACTURE SURGERY  04/2011   R hip   HIP FRACTURE SURGERY Right 02/2016   JOINT REPLACEMENT     NASAL FRACTURE SURGERY     ROBOTIC ASSISTED LAPAROSCOPIC CHOLECYSTECTOMY-MULTI SITE N/A 03/17/2018   Procedure: ROBOTIC ASSISTED LAPAROSCOPIC CHOLECYSTECTOMY-MULTI SITE;  Surgeon: Jordis Laneta FALCON, MD;  Location: [facility] ORS;  Service: General;  Laterality: N/A;    Social History   Socioeconomic History   Marital status: Married    Spouse name: Not on file   Number of children: Not on file   Years of education: Not on file   Highest education level: Not on file  Occupational History   Not on file  Tobacco Use   Smoking status: Former    Current packs/day: 0.00    Types: Cigarettes    Quit date: 04/14/2007    Years since quitting: 16.9   Smokeless tobacco: Never  Vaping Use   Vaping status: Never Used  Substance and Sexual Activity   Alcohol use: No   Drug use: No   Sexual activity: Not Currently  Other Topics Concern   Not on file  Social History Narrative   Not on file   Social Drivers of Health   Tobacco Use: Medium Risk (03/24/2024)   Patient History     Smoking Tobacco Use: Former    Smokeless Tobacco Use: Never    Passive Exposure: Not on Actuary Strain: Low Risk  (02/16/2024)   Received from [facility]   Overall Financial Resource Strain (CARDIA)    Difficulty of Paying Living Expenses: Not hard at all  Food Insecurity: No Food Insecurity (02/16/2024)   Received from [facility]   Epic    Within the past 12 months, you worried that your food would run out before you got the money to buy more.: Never true    Within the past 12 months, the food you bought just didn't last and you didn't have money to get more.: Never true  Transportation Needs: No Transportation Needs (02/16/2024)   Received from [facility] - Transportation    In the past 12 months, has lack of transportation kept you from medical appointments or from getting medications?: No    Lack of Transportation (Non-Medical): No  Physical Activity: Not on file  Stress: Not on file  Social Connections: Not on file  Intimate Partner Violence: Not on file  Depression [phone]): Low Risk (03/24/2024)   Depression (PHQ2-9)    PHQ-2 Score: 0  Alcohol Screen: Not on file  Housing: Unknown (02/16/2024)   [facility] from [facility]   Epic    In the last 12 months, was there a time when you were not able to pay the mortgage or rent on time?: No    Number of Times Moved in the Last Year: Not on file    At any time in the past 12 months, were you homeless or living in a shelter (including now)?: No  Utilities: Not At Risk (02/16/2024)   Received from [facility]   Epic    In the past 12 months has the electric, gas, oil, or water company threatened to shut off services in your home?: No  Health Literacy: Not on file    Family History  Problem Relation Age of Onset   Prostate cancer Neg Hx    Bladder Cancer Neg Hx    Kidney cancer Neg Hx     Current  Medications[2]  Physical exam:  Vitals:   03/24/24 1114  BP: 137/81  Pulse: 79  Resp: 18  Temp: (!) 96.1 F (35.6 C)  TempSrc: Tympanic  SpO2: 100%  Weight: 164 lb 1.6 oz (74.4 kg)  Height: 5' 11 (1.803 m)   Physical Exam Cardiovascular:     Rate and Rhythm: Normal rate and regular rhythm.     Heart sounds: Normal heart sounds.  Pulmonary:     Effort: Pulmonary effort is normal.     Breath sounds: Normal breath sounds.  Abdominal:     General: There is no distension.     Tenderness: There is no abdominal tenderness.     Comments: No palpable hepatosplenomegaly  Lymphadenopathy:     Comments: No palpable cervical, supraclavicular, axillary or inguinal adenopathy    Skin:    General: Skin is warm and dry.  Neurological:     Mental Status: He is alert and oriented to person, place, and time.      I have personally reviewed labs listed below:    Latest Ref Rng & Units 03/24/2024   10:54 AM  CMP  Glucose 70 - 99 mg/dL 897   BUN 8 - 23 mg/dL 25   Creatinine 9.38 - 1.24 mg/dL 8.94   Sodium 864 - 854 mmol/L 141   Potassium 3.5 - 5.1 mmol/L 4.3   Chloride 98 - 111 mmol/L 102   CO2 22 - 32 mmol/L 26   Calcium 8.9 - 10.3 mg/dL 9.5   Total Protein 6.5 - 8.1 g/dL 7.7   Total Bilirubin 0.0 - 1.2 mg/dL 1.4   Alkaline Phos 38 - 126 U/L 104   AST 15 - 41 U/L 27   ALT 0 - 44 U/L 18       Latest Ref Rng & Units 03/24/2024   10:54 AM  CBC  WBC 4.0 - 10.5 K/uL 6.3   Hemoglobin 13.0 - 17.0 g/dL 86.0   Hematocrit 60.9 - 52.0 % 41.6   Platelets 150 - 400 K/uL 360     Assessment and plan- Patient is a 73 y.o. male with history of Waldenstrm's macroglobulinemia currently in remission here for a routine follow-up  Clinically patient is doing well with no concerning B symptoms.  No palpable adenopathy or hepatosplenomegaly.  Myeloma panel and serum free light chains are currently pending.  CBC is normal.  Bilirubin fluctuates between 1.2-1.4.  LFTs are normal.  I will see  him on a yearly basis   Visit Diagnosis 1. History of 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Dr. Annah Skene, MD, MPH [facility]  [facility] [phone] 03/24/2024 11:41 AM                   [1]  Allergies Allergen Reactions   Ibrutinib Nausea Only  [2]  Current Outpatient Medications:    acetaminophen  (TYLENOL ) 500 MG tablet, Take 500 mg by mouth every 6 (six) hours as needed., Disp: , Rfl:    pantoprazole (PROTONIX) 40 MG tablet, Take 40 mg by mouth daily. , Disp: , Rfl:    PARoxetine (PAXIL) 40 MG tablet, Take 40 mg by mouth every morning., Disp: , Rfl:

## 2024-03-24 NOTE — Progress Notes (Signed)
 Patient doing pretty good; no new or acute concerns at this time.

## 2024-03-28 LAB — MULTIPLE MYELOMA PANEL, SERUM
Albumin SerPl Elph-Mcnc: 4 g/dL (ref 2.9–4.4)
Albumin/Glob SerPl: 1.3 (ref 0.7–1.7)
Alpha 1: 0.2 g/dL (ref 0.0–0.4)
Alpha2 Glob SerPl Elph-Mcnc: 0.8 g/dL (ref 0.4–1.0)
B-Globulin SerPl Elph-Mcnc: 1.2 g/dL (ref 0.7–1.3)
Gamma Glob SerPl Elph-Mcnc: 1.1 g/dL (ref 0.4–1.8)
Globulin, Total: 3.3 g/dL (ref 2.2–3.9)
IgA: 304 mg/dL (ref 61–437)
IgG (Immunoglobin G), Serum: 952 mg/dL (ref 603–1613)
IgM (Immunoglobulin M), Srm: 402 mg/dL — ABNORMAL HIGH (ref 15–143)
M Protein SerPl Elph-Mcnc: 0.4 g/dL — ABNORMAL HIGH
Total Protein ELP: 7.3 g/dL (ref 6.0–8.5)

## 2024-03-29 LAB — KAPPA/LAMBDA LIGHT CHAINS
Kappa free light chain: 24.7 mg/L — ABNORMAL HIGH (ref 3.3–19.4)
Kappa, lambda light chain ratio: 1.04 (ref 0.26–1.65)
Lambda free light chains: 23.7 mg/L (ref 5.7–26.3)

## 2024-09-26 ENCOUNTER — Ambulatory Visit (INDEPENDENT_AMBULATORY_CARE_PROVIDER_SITE_OTHER): Admitting: Vascular Surgery

## 2024-09-26 ENCOUNTER — Other Ambulatory Visit (INDEPENDENT_AMBULATORY_CARE_PROVIDER_SITE_OTHER)

## 2025-03-23 ENCOUNTER — Inpatient Hospital Stay: Admitting: Oncology

## 2025-03-23 ENCOUNTER — Inpatient Hospital Stay
